# Patient Record
Sex: Male | Born: 1960 | Race: Black or African American | Hispanic: No | Marital: Single | Smoking: Current every day smoker
Health system: Southern US, Community
[De-identification: ages and names within clinical notes are randomized; demographics above are authoritative.]

## PROBLEM LIST (undated history)

## (undated) DIAGNOSIS — K219 Gastro-esophageal reflux disease without esophagitis: Secondary | ICD-10-CM

## (undated) DIAGNOSIS — M545 Low back pain: Secondary | ICD-10-CM

## (undated) DIAGNOSIS — R748 Abnormal levels of other serum enzymes: Secondary | ICD-10-CM

## (undated) DIAGNOSIS — R51 Headache: Secondary | ICD-10-CM

## (undated) DIAGNOSIS — R1013 Epigastric pain: Secondary | ICD-10-CM

## (undated) DIAGNOSIS — L720 Epidermal cyst: Secondary | ICD-10-CM

## (undated) DIAGNOSIS — K047 Periapical abscess without sinus: Secondary | ICD-10-CM

## (undated) DIAGNOSIS — R109 Unspecified abdominal pain: Secondary | ICD-10-CM

## (undated) DIAGNOSIS — F5232 Male orgasmic disorder: Secondary | ICD-10-CM

## (undated) DIAGNOSIS — I10 Essential (primary) hypertension: Secondary | ICD-10-CM

## (undated) DIAGNOSIS — N3289 Other specified disorders of bladder: Secondary | ICD-10-CM

## (undated) DIAGNOSIS — Z720 Tobacco use: Secondary | ICD-10-CM

## (undated) DIAGNOSIS — N529 Male erectile dysfunction, unspecified: Secondary | ICD-10-CM

## (undated) DIAGNOSIS — K4090 Unilateral inguinal hernia, without obstruction or gangrene, not specified as recurrent: Secondary | ICD-10-CM

## (undated) DIAGNOSIS — J039 Acute tonsillitis, unspecified: Secondary | ICD-10-CM

## (undated) DIAGNOSIS — Z131 Encounter for screening for diabetes mellitus: Secondary | ICD-10-CM

## (undated) DIAGNOSIS — F411 Generalized anxiety disorder: Secondary | ICD-10-CM

## (undated) DIAGNOSIS — G8929 Other chronic pain: Secondary | ICD-10-CM

## (undated) DIAGNOSIS — K649 Unspecified hemorrhoids: Secondary | ICD-10-CM

## (undated) DIAGNOSIS — M79674 Pain in right toe(s): Secondary | ICD-10-CM

## (undated) HISTORY — DX: Abnormal levels of other serum enzymes: R74.8

## (undated) HISTORY — DX: Male orgasmic disorder: F52.32

## (undated) HISTORY — DX: Unilateral inguinal hernia, without obstruction or gangrene, not specified as recurrent: K40.90

## (undated) HISTORY — DX: Epidermal cyst: L72.0

## (undated) HISTORY — DX: Unspecified hemorrhoids: K64.9

## (undated) HISTORY — DX: Tobacco use: Z72.0

## (undated) HISTORY — DX: Periapical abscess without sinus: K04.7

## (undated) HISTORY — DX: Acute tonsillitis, unspecified: J03.90

## (undated) HISTORY — DX: Other specified disorders of bladder: N32.89

## (undated) HISTORY — DX: Low back pain: M54.5

## (undated) HISTORY — DX: Other chronic pain: G89.29

## (undated) HISTORY — DX: Headache: R51

## (undated) HISTORY — DX: Male erectile dysfunction, unspecified: N52.9

## (undated) HISTORY — DX: Unspecified abdominal pain: R10.9

## (undated) HISTORY — DX: Encounter for screening for diabetes mellitus: Z13.1

## (undated) HISTORY — DX: Pain in right toe(s): M79.674

## (undated) HISTORY — DX: Epigastric pain: R10.13

## (undated) HISTORY — PX: COLONOSCOPY: SHX174

## (undated) HISTORY — PX: OTHER SURGICAL HISTORY: SHX169

## (undated) HISTORY — DX: Generalized anxiety disorder: F41.1

---

## 2014-01-15 DIAGNOSIS — R1013 Epigastric pain: Secondary | ICD-10-CM

## 2014-01-15 DIAGNOSIS — Z131 Encounter for screening for diabetes mellitus: Secondary | ICD-10-CM

## 2014-01-15 DIAGNOSIS — F411 Generalized anxiety disorder: Secondary | ICD-10-CM | POA: Insufficient documentation

## 2014-01-15 DIAGNOSIS — K219 Gastro-esophageal reflux disease without esophagitis: Secondary | ICD-10-CM | POA: Insufficient documentation

## 2014-01-15 HISTORY — DX: Generalized anxiety disorder: F41.1

## 2014-01-15 HISTORY — DX: Epigastric pain: R10.13

## 2014-01-15 HISTORY — DX: Encounter for screening for diabetes mellitus: Z13.1

## 2014-02-12 DIAGNOSIS — J039 Acute tonsillitis, unspecified: Secondary | ICD-10-CM

## 2014-02-12 HISTORY — DX: Acute tonsillitis, unspecified: J03.90

## 2014-02-20 ENCOUNTER — Emergency Department: Payer: Self-pay | Admitting: Emergency Medicine

## 2014-02-26 DIAGNOSIS — N3289 Other specified disorders of bladder: Secondary | ICD-10-CM

## 2014-02-26 DIAGNOSIS — K047 Periapical abscess without sinus: Secondary | ICD-10-CM

## 2014-02-26 HISTORY — DX: Other specified disorders of bladder: N32.89

## 2014-02-26 HISTORY — DX: Periapical abscess without sinus: K04.7

## 2014-03-05 DIAGNOSIS — M545 Low back pain, unspecified: Secondary | ICD-10-CM

## 2014-03-05 DIAGNOSIS — K4090 Unilateral inguinal hernia, without obstruction or gangrene, not specified as recurrent: Secondary | ICD-10-CM

## 2014-03-05 HISTORY — DX: Low back pain, unspecified: M54.50

## 2014-03-05 HISTORY — DX: Unilateral inguinal hernia, without obstruction or gangrene, not specified as recurrent: K40.90

## 2014-04-05 DIAGNOSIS — M79674 Pain in right toe(s): Secondary | ICD-10-CM | POA: Insufficient documentation

## 2014-04-05 DIAGNOSIS — K649 Unspecified hemorrhoids: Secondary | ICD-10-CM

## 2014-04-05 HISTORY — DX: Unspecified hemorrhoids: K64.9

## 2014-04-05 HISTORY — DX: Pain in right toe(s): M79.674

## 2014-04-11 DIAGNOSIS — N529 Male erectile dysfunction, unspecified: Secondary | ICD-10-CM | POA: Insufficient documentation

## 2014-04-11 HISTORY — DX: Male erectile dysfunction, unspecified: N52.9

## 2014-04-29 HISTORY — PX: UPPER GASTROINTESTINAL ENDOSCOPY: SHX188

## 2014-07-17 DIAGNOSIS — R519 Headache, unspecified: Secondary | ICD-10-CM

## 2014-07-17 DIAGNOSIS — R748 Abnormal levels of other serum enzymes: Secondary | ICD-10-CM | POA: Insufficient documentation

## 2014-07-17 DIAGNOSIS — R51 Headache: Secondary | ICD-10-CM

## 2014-07-17 HISTORY — DX: Headache, unspecified: R51.9

## 2014-07-17 HISTORY — DX: Abnormal levels of other serum enzymes: R74.8

## 2014-08-23 DIAGNOSIS — F5232 Male orgasmic disorder: Secondary | ICD-10-CM | POA: Insufficient documentation

## 2014-08-23 HISTORY — DX: Male orgasmic disorder: F52.32

## 2014-09-16 DIAGNOSIS — Z720 Tobacco use: Secondary | ICD-10-CM | POA: Insufficient documentation

## 2014-09-16 HISTORY — DX: Tobacco use: Z72.0

## 2015-02-07 HISTORY — PX: COLONOSCOPY W/ POLYPECTOMY: SHX1380

## 2015-03-13 HISTORY — PX: LAPAROSCOPY ABDOMEN DIAGNOSTIC: PRO50

## 2015-07-09 ENCOUNTER — Emergency Department
Admission: EM | Admit: 2015-07-09 | Discharge: 2015-07-09 | Disposition: A | Discharge status: Home / Self Care | Payer: Self-pay | Attending: Emergency Medicine | Admitting: Emergency Medicine

## 2015-07-09 ENCOUNTER — Emergency Department: Payer: Self-pay

## 2015-07-09 ENCOUNTER — Encounter: Payer: Self-pay | Admitting: Emergency Medicine

## 2015-07-09 DIAGNOSIS — K140 Glossitis: Secondary | ICD-10-CM | POA: Insufficient documentation

## 2015-07-09 DIAGNOSIS — R072 Precordial pain: Secondary | ICD-10-CM | POA: Insufficient documentation

## 2015-07-09 DIAGNOSIS — I10 Essential (primary) hypertension: Secondary | ICD-10-CM | POA: Insufficient documentation

## 2015-07-09 DIAGNOSIS — F17200 Nicotine dependence, unspecified, uncomplicated: Secondary | ICD-10-CM | POA: Insufficient documentation

## 2015-07-09 DIAGNOSIS — R079 Chest pain, unspecified: Secondary | ICD-10-CM

## 2015-07-09 HISTORY — DX: Gastro-esophageal reflux disease without esophagitis: K21.9

## 2015-07-09 HISTORY — DX: Essential (primary) hypertension: I10

## 2015-07-09 LAB — BASIC METABOLIC PANEL
ANION GAP: 10 (ref 5–15)
BUN: 16 mg/dL (ref 6–20)
CHLORIDE: 106 mmol/L (ref 101–111)
CO2: 26 mmol/L (ref 22–32)
Calcium: 8.9 mg/dL (ref 8.9–10.3)
Creatinine, Ser: 1.09 mg/dL (ref 0.61–1.24)
GFR calc non Af Amer: >60 mL/min (ref >60)
Glucose, Bld: 114 mg/dL — ABNORMAL HIGH (ref 65–99)
POTASSIUM: 3.1 mmol/L — AB (ref 3.5–5.1)
SODIUM: 142 mmol/L (ref 135–145)

## 2015-07-09 LAB — CBC
HEMATOCRIT: 45.1 % (ref 40.0–52.0)
HEMOGLOBIN: 15.7 g/dL (ref 13.0–18.0)
MCH: 30.7 pg (ref 26.0–34.0)
MCHC: 34.8 g/dL (ref 32.0–36.0)
MCV: 88.3 fL (ref 80.0–100.0)
Platelets: 201 10*3/uL (ref 150–440)
RBC: 5.11 MIL/uL (ref 4.40–5.90)
RDW: 14.2 % (ref 11.5–14.5)
WBC: 5.9 10*3/uL (ref 3.8–10.6)

## 2015-07-09 LAB — TROPONIN I: Troponin I: <0.03 ng/mL (ref <0.031)

## 2015-07-09 MED ORDER — MAGIC MOUTHWASH W/LIDOCAINE: 10.0000 mL | Freq: Three times a day (TID) | ORAL | Status: DC | PRN

## 2015-07-09 MED ORDER — MAGIC MOUTHWASH W/LIDOCAINE: 10.0000 mL | Freq: Three times a day (TID) | ORAL | Status: AC | PRN

## 2015-07-09 NOTE — Discharge Instructions (Signed)

## 2015-07-09 NOTE — ED Provider Notes (Signed)
[facility] Emergency Department Provider Note  ____________________________________________  Time seen: 3:00 AM  I have reviewed the triage vital signs and the nursing notes.   HISTORY  Chief Complaint Chest Pain     HPI Douglas Mcdaniel is a 55 y.o. male presents with midsternal chest pain with onset at tonight patient states he awoke him from sleep. However patient states that his chest pain is completely resolved. Patient also admits to a "ulcer on the right side of his tongue that is painful.     Past Medical History  Diagnosis Date  . Hypertension   . GERD (gastroesophageal reflux disease)     There are no active problems to display for this patient.   History reviewed. No pertinent past surgical history.  Current Outpatient Rx  Name  Route  Sig  Dispense  Refill  . amLODipine (NORVASC) 10 MG tablet   Oral   Take 10 mg by mouth daily.         . hydrochlorothiazide (HYDRODIURIL) 25 MG tablet   Oral   Take 25 mg by mouth daily.           Allergies No known drug allergies History reviewed. No pertinent family history.  Social History Social History  Substance Use Topics  . Smoking status: Current Every Day Smoker  . Smokeless tobacco: None  . Alcohol Use: Yes    Review of Systems  Constitutional: Negative for fever. Eyes: Negative for visual changes. ENT: Negative for sore throat. Cardiovascular:Positive for chest pain. Respiratory: Negative for shortness of breath. Gastrointestinal: Negative for abdominal pain, vomiting and diarrhea. Genitourinary: Negative for dysuria. Musculoskeletal: Negative for back pain. Skin: Negative for rash. Neurological: Negative for headaches, focal weakness or numbness.   10-point ROS otherwise negative.  ____________________________________________   PHYSICAL EXAM:  VITAL SIGNS: ED Triage Vitals  Enc Vitals Group     BP 07/09/15 0221 127/74 mmHg     Pulse Rate 07/09/15 0221  81     Resp 07/09/15 0221 18     Temp 07/09/15 0221 97.6 F (36.4 C)     Temp Source 07/09/15 0221 Oral     SpO2 07/09/15 0221 91 %     Weight 07/09/15 0219 196 lb (88.905 kg)     Height 07/09/15 0219 5\' 9"  (1.753 m)     Head Cir --      Peak Flow --      Pain Score 07/09/15 0219 8     Pain Loc --      Pain Edu? --      Excl. in GC? --      Constitutional: Alert and oriented. Well appearing and in no distress. Eyes: Conjunctivae are normal. PERRL. Normal extraocular movements. ENT   Head: Normocephalic and atraumatic.   Nose: No congestion/rhinnorhea.   Mouth/Throat: Mucous membranes are moist.Small ulcer noted right lateral aspect of the patient's tongue without signs of infection   Neck: No stridor. Hematological/Lymphatic/Immunilogical: No cervical lymphadenopathy. Cardiovascular: Normal rate, regular rhythm. Normal and symmetric distal pulses are present in all extremities. No murmurs, rubs, or gallops. Respiratory: Normal respiratory effort without tachypnea nor retractions. Breath sounds are clear and equal bilaterally. No wheezes/rales/rhonchi. Gastrointestinal: Soft and nontender. No distention. There is no CVA tenderness. Genitourinary: deferred Musculoskeletal: Nontender with normal range of motion in all extremities. No joint effusions.  No lower extremity tenderness nor edema. Neurologic:  Normal speech and language. No gross focal neurologic deficits are appreciated. Speech is normal.  Skin:  Skin is warm, dry and intact. No rash noted. Psychiatric: Mood and affect are normal. Speech and behavior are normal. Patient exhibits appropriate insight and judgment.  ____________________________________________    LABS (pertinent positives/negatives)  Labs Reviewed  BASIC METABOLIC PANEL - Abnormal; Notable for the following:    Potassium 3.1 (*)    Glucose, Bld 114 (*)    All other components within normal limits  CBC  TROPONIN I      ____________________________________________   EKG  ED ECG REPORT I, [place] Aldyn Toon, the attending physician, personally viewed and interpreted this ECG.   Date: 07/10/2015  EKG Time: 2:20 AM  Rate: 87  Rhythm: Normal sinus rhythm  Axis: Normal  Intervals: Normal  ST&T Change: None   ____________________________________________    RADIOLOGY  DG Chest Port 1 View (Final result) Result time: 07/09/15 04:13:48   Final result by Rad Results In Interface (07/09/15 04:13:48)   Narrative:   CLINICAL DATA: Midsternal chest pain.  EXAM: PORTABLE CHEST 1 VIEW  COMPARISON: None.  FINDINGS: Shallow inspiration with elevation of the left hemidiaphragm. Scattered calcified granulomas in the lungs. Normal heart size and pulmonary vascularity. No focal airspace disease or consolidation in the lungs. No blunting of costophrenic angles. No pneumothorax. Mediastinal contours appear intact.  IMPRESSION: Elevation of left hemidiaphragm. No evidence of active pulmonary disease.   Electronically Signed By: Burman Nieves M.D. On: 07/09/2015 04:13             INITIAL IMPRESSION / ASSESSMENT AND PLAN / ED COURSE  Pertinent labs & imaging results that were available during my care of the patient were reviewed by me and considered in my medical decision making (see chart for details).  Patient without any chest pain at the time of my evaluation remained chest pain-free during his stay in the emergency department. Patient requesting a prescription for the area that is painful on his tongue for which she was given Magic mouthwash with lidocaine. In addition patient referred to cardiologist for outpatient evaluation for the chest pain that he did explain to home.  ____________________________________________   FINAL CLINICAL IMPRESSION(S) / ED DIAGNOSES  Final diagnoses:  Chest pain, unspecified chest pain type  Tongue ulcer      Darci Current,  MD 07/10/15 0401

## 2015-07-09 NOTE — ED Notes (Signed)
Dr. Brown at bedside

## 2015-07-09 NOTE — ED Notes (Signed)
Pt in with co midsternal chest pain that woke him up tonight.

## 2015-07-09 NOTE — ED Notes (Signed)
Patient states he has an ulcer on right side of tongue. States it is painful.

## 2015-08-06 ENCOUNTER — Emergency Department
Admission: EM | Admit: 2015-08-06 | Discharge: 2015-08-06 | Disposition: A | Discharge status: Home / Self Care | Payer: Self-pay | Attending: Emergency Medicine | Admitting: Emergency Medicine

## 2015-08-06 ENCOUNTER — Encounter: Payer: Self-pay | Admitting: Emergency Medicine

## 2015-08-06 ENCOUNTER — Emergency Department: Payer: Self-pay

## 2015-08-06 DIAGNOSIS — I10 Essential (primary) hypertension: Secondary | ICD-10-CM | POA: Insufficient documentation

## 2015-08-06 DIAGNOSIS — F17210 Nicotine dependence, cigarettes, uncomplicated: Secondary | ICD-10-CM | POA: Insufficient documentation

## 2015-08-06 DIAGNOSIS — J40 Bronchitis, not specified as acute or chronic: Secondary | ICD-10-CM | POA: Insufficient documentation

## 2015-08-06 LAB — BASIC METABOLIC PANEL
Anion gap: 7 (ref 5–15)
BUN: 16 mg/dL (ref 6–20)
CHLORIDE: 104 mmol/L (ref 101–111)
CO2: 26 mmol/L (ref 22–32)
CREATININE: 1.11 mg/dL (ref 0.61–1.24)
Calcium: 8.4 mg/dL — ABNORMAL LOW (ref 8.9–10.3)
GFR calc non Af Amer: >60 mL/min (ref >60)
Glucose, Bld: 155 mg/dL — ABNORMAL HIGH (ref 65–99)
Potassium: 3.3 mmol/L — ABNORMAL LOW (ref 3.5–5.1)
Sodium: 137 mmol/L (ref 135–145)

## 2015-08-06 LAB — CBC
HEMATOCRIT: 46.9 % (ref 40.0–52.0)
HEMOGLOBIN: 15.7 g/dL (ref 13.0–18.0)
MCH: 30.1 pg (ref 26.0–34.0)
MCHC: 33.4 g/dL (ref 32.0–36.0)
MCV: 90.1 fL (ref 80.0–100.0)
Platelets: 197 10*3/uL (ref 150–440)
RBC: 5.21 MIL/uL (ref 4.40–5.90)
RDW: 14.5 % (ref 11.5–14.5)
WBC: 6 10*3/uL (ref 3.8–10.6)

## 2015-08-06 LAB — TROPONIN I: Troponin I: <0.03 ng/mL (ref <0.031)

## 2015-08-06 MED ORDER — PREDNISONE 20 MG PO TABS: ORAL_TABLET | ORAL | Status: DC

## 2015-08-06 MED ORDER — PREDNISONE 20 MG PO TABS
60.0000 mg | ORAL_TABLET | Freq: Once | ORAL | Status: AC
  Administered 2015-08-06: 60 mg via ORAL

## 2015-08-06 MED ORDER — ALBUTEROL SULFATE HFA 108 (90 BASE) MCG/ACT IN AERS: 2.0000 | INHALATION_SPRAY | RESPIRATORY_TRACT | Status: DC | PRN

## 2015-08-06 MED ORDER — HYDROCOD POLST-CPM POLST ER 10-8 MG/5ML PO SUER: 5.0000 mL | Freq: Two times a day (BID) | ORAL | Status: DC

## 2015-08-06 NOTE — ED Notes (Signed)
Pt ambulatory to triage room with steady gait. Pt resents to ED with c/o cough x 2 days, reports produces white sputum when coughing. Pt also c/o shortness of breath and chest pain when coughing. Pt alert and oriented x 4, no increased work in breathing noted. Speaking in complete sentences.

## 2015-08-06 NOTE — ED Provider Notes (Signed)
[facility] Emergency Department Provider Note   ____________________________________________  Time seen: Approximately 5:38 AM  I have reviewed the triage vital signs and the nursing notes.   HISTORY  Chief Complaint Cough and Chest Pain    HPI Douglas Mcdaniel is a 55 y.o. male presents to the ED from home with a chief complaint of cough 2 days. Reports cough with white sputum with occasional wheezing and shortness of breath. Denies associated fever, chills, chest pain, abdominal pain, nausea, vomiting, diarrhea. Denies recent travel or trauma. Nothing makes his symptoms better or worse.   Past Medical History  Diagnosis Date  . Hypertension   . GERD (gastroesophageal reflux disease)     There are no active problems to display for this patient.   History reviewed. No pertinent past surgical history.  Current Outpatient Rx  Name  Route  Sig  Dispense  Refill  . albuterol (PROVENTIL HFA;VENTOLIN HFA) 108 (90 Base) MCG/ACT inhaler   Inhalation   Inhale 2 puffs into the lungs every 4 (four) hours as needed for wheezing or shortness of breath.   1 Inhaler   0   . amLODipine (NORVASC) 10 MG tablet   Oral   Take 10 mg by mouth daily.         . chlorpheniramine-HYDROcodone (TUSSIONEX PENNKINETIC ER) 10-8 MG/5ML SUER   Oral   Take 5 mLs by mouth 2 (two) times daily.   70 mL   0   . hydrochlorothiazide (HYDRODIURIL) 25 MG tablet   Oral   Take 25 mg by mouth daily.         . predniSONE (DELTASONE) 20 MG tablet      3 tablets daily 4 days   12 tablet   0     Allergies Review of patient's allergies indicates no known allergies.  No family history on file.  Social History Social History  Substance Use Topics  . Smoking status: Current Every Day Smoker -- 0.00 packs/day    Types: Cigarettes  . Smokeless tobacco: None  . Alcohol Use: Yes  + smoker  Review of Systems  Constitutional: No fever/chills. Eyes: No visual  changes. ENT: No sore throat. Cardiovascular: Denies chest pain. Respiratory: Positive for cough and shortness of breath. Gastrointestinal: No abdominal pain.  No nausea, no vomiting.  No diarrhea.  No constipation. Genitourinary: Negative for dysuria. Musculoskeletal: Negative for back pain. Skin: Negative for rash. Neurological: Negative for headaches, focal weakness or numbness.  10-point ROS otherwise negative.  ____________________________________________   PHYSICAL EXAM:  VITAL SIGNS: ED Triage Vitals  Enc Vitals Group     BP 08/06/15 0213 144/78 mmHg     Pulse Rate 08/06/15 0213 86     Resp 08/06/15 0213 18     Temp 08/06/15 0213 97.8 F (36.6 C)     Temp Source 08/06/15 0213 Oral     SpO2 08/06/15 0213 96 %     Weight 08/06/15 0213 200 lb (90.719 kg)     Height 08/06/15 0213  (1.753 m)     Head Cir --      Peak Flow --      Pain Score 08/06/15 0214 6     Pain Loc --      Pain Edu? --      Excl. in GC? --     Constitutional: Alert and oriented. Well appearing and in no acute distress. Eyes: Conjunctivae are normal. PERRL. EOMI. Head: Atraumatic. Nose: No congestion/rhinnorhea. Mouth/Throat: Mucous  membranes are moist.  Oropharynx non-erythematous. Neck: No stridor.   Cardiovascular: Normal rate, regular rhythm. Grossly normal heart sounds.  Good peripheral circulation. Respiratory: Normal respiratory effort.  No retractions. Lungs CTAB. No wheezing. Bronchitic cough noted. Gastrointestinal: Soft and nontender. No distention. No abdominal bruits. No CVA tenderness. Musculoskeletal: No lower extremity tenderness nor edema.  No joint effusions. Neurologic:  Normal speech and language. No gross focal neurologic deficits are appreciated. No gait instability. Skin:  Skin is warm, dry and intact. No rash noted. Psychiatric: Mood and affect are normal. Speech and behavior are normal.  ____________________________________________   LABS (all labs ordered are  listed, but only abnormal results are displayed)  Labs Reviewed  BASIC METABOLIC PANEL - Abnormal; Notable for the following:    Potassium 3.3 (*)    Glucose, Bld 155 (*)    Calcium 8.4 (*)    All other components within normal limits  CBC  TROPONIN I   ____________________________________________  EKG  ED ECG REPORT I, SUNG,JADE J, the attending physician, personally viewed and interpreted this ECG.   Date: 08/06/2015  EKG Time: 0219  Rate: 83  Rhythm: normal EKG, normal sinus rhythm  Axis: Normal  Intervals:none  ST&T Change: Nonspecific  ____________________________________________  RADIOLOGY  Chest 2 view (view by me, interpreted per Dr. Cherly Hensenhang): Mild bibasilar opacities likely reflect atelectasis. ____________________________________________   PROCEDURES  Procedure(s) performed: None  Critical Care performed: No  ____________________________________________   INITIAL IMPRESSION / ASSESSMENT AND PLAN / ED COURSE  Pertinent labs & imaging results that were available during my care of the patient were reviewed by me and considered in my medical decision making (see chart for details).  55 year old smoker who presents with bronchitis-like symptoms. Updated patient of laboratory and imaging results. Will place on prednisone burst, prescribe inhaler as needed, Tussionex as needed and follow-up with PCP. Strict return precautions given. Patient verbalizes understanding and agrees with plan of care. ____________________________________________   FINAL CLINICAL IMPRESSION(S) / ED DIAGNOSES  Final diagnoses:  Bronchitis      NEW MEDICATIONS STARTED DURING THIS VISIT:  New Prescriptions   ALBUTEROL (PROVENTIL HFA;VENTOLIN HFA) 108 (90 BASE) MCG/ACT INHALER    Inhale 2 puffs into the lungs every 4 (four) hours as needed for wheezing or shortness of breath.   CHLORPHENIRAMINE-HYDROCODONE (TUSSIONEX PENNKINETIC ER) 10-8 MG/5ML SUER    Take 5 mLs by mouth 2 (two)  times daily.   PREDNISONE (DELTASONE) 20 MG TABLET    3 tablets daily 4 days     Note:  This document was prepared using Dragon voice recognition software and may include unintentional dictation errors.    Irean HongJade J Sung, MD 08/06/15 [phone]

## 2015-08-06 NOTE — Discharge Instructions (Signed)
1. Take steroid as prescribed (Prednisone 60mg  daily x 4 days). 2. You may use albuterol inhaler 2 puffs every 4 hours as needed for wheezing. 3. You may take cough medicine as needed (Tussionex) disease. 4. Return to ER for worsening symptoms, persistent vomiting, difficult breathing or other concerns.  How to Use an Inhaler Using your inhaler correctly is very important. Good technique will make sure that the medicine reaches your lungs.  HOW TO USE AN INHALER: 1. Take the cap off the inhaler. 2. If this is the first time using your inhaler, you need to prime it. Shake the inhaler for 5 seconds. Release four puffs into the air, away from your face. Ask your doctor for help if you have questions. 3. Shake the inhaler for 5 seconds. 4. Turn the inhaler so the bottle is above the mouthpiece. 5. Put your pointer finger on top of the bottle. Your thumb holds the bottom of the inhaler. 6. Open your mouth. 7. Either hold the inhaler away from your mouth (the width of 2 fingers) or place your lips tightly around the mouthpiece. Ask your doctor which way to use your inhaler. 8. Breathe out as much air as possible. 9. Breathe in and push down on the bottle 1 time to release the medicine. You will feel the medicine go in your mouth and throat. 10. Continue to take a deep breath in very slowly. Try to fill your lungs. 11. After you have breathed in completely, hold your breath for 10 seconds. This will help the medicine to settle in your lungs. If you cannot hold your breath for 10 seconds, hold it for as long as you can before you breathe out. 12. Breathe out slowly, through pursed lips. Whistling is an example of pursed lips. 13. If your doctor has told you to take more than 1 puff, wait at least 15-30 seconds between puffs. This will help you get the best results from your medicine. Do not use the inhaler more than your doctor tells you to. 14. Put the cap back on the inhaler. 15. Follow the  directions from your doctor or from the inhaler package about cleaning the inhaler. If you use more than one inhaler, ask your doctor which inhalers to use and what order to use them in. Ask your doctor to help you figure out when you will need to refill your inhaler.  If you use a steroid inhaler, always rinse your mouth with water after your last puff, gargle and spit out the water. Do not swallow the water. GET HELP IF:  The inhaler medicine only partially helps to stop wheezing or shortness of breath.  You are having trouble using your inhaler.  You have some increase in thick spit (phlegm). GET HELP RIGHT AWAY IF:  The inhaler medicine does not help your wheezing or shortness of breath or you have tightness in your chest.  You have dizziness, headaches, or fast heart rate.  You have chills, fever, or night sweats.  You have a large increase of thick spit, or your thick spit is bloody. MAKE SURE YOU:   Understand these instructions.  Will watch your condition.  Will get help right away if you are not doing well or get worse.   This information is not intended to replace advice given to you by your health care provider. Make sure you discuss any questions you have with your health care provider.   Document Released: 12/02/2007 Document Revised: 12/13/2012 Document Reviewed: 09/21/2012 Elsevier  Interactive Patient Education ©2016 [employer] ° °

## 2015-08-13 DIAGNOSIS — R109 Unspecified abdominal pain: Secondary | ICD-10-CM

## 2015-08-13 DIAGNOSIS — L720 Epidermal cyst: Secondary | ICD-10-CM

## 2015-08-13 DIAGNOSIS — G8929 Other chronic pain: Secondary | ICD-10-CM | POA: Insufficient documentation

## 2015-08-13 HISTORY — DX: Epidermal cyst: L72.0

## 2015-08-13 HISTORY — DX: Unspecified abdominal pain: R10.9

## 2015-08-13 HISTORY — DX: Other chronic pain: G89.29

## 2016-02-06 ENCOUNTER — Emergency Department: Payer: Self-pay

## 2016-02-06 ENCOUNTER — Emergency Department
Admission: EM | Admit: 2016-02-06 | Discharge: 2016-02-06 | Disposition: A | Discharge status: Home / Self Care | Payer: Self-pay | Attending: Emergency Medicine | Admitting: Emergency Medicine

## 2016-02-06 DIAGNOSIS — I10 Essential (primary) hypertension: Secondary | ICD-10-CM | POA: Insufficient documentation

## 2016-02-06 DIAGNOSIS — Z79899 Other long term (current) drug therapy: Secondary | ICD-10-CM | POA: Insufficient documentation

## 2016-02-06 DIAGNOSIS — F17210 Nicotine dependence, cigarettes, uncomplicated: Secondary | ICD-10-CM | POA: Insufficient documentation

## 2016-02-06 DIAGNOSIS — R1013 Epigastric pain: Secondary | ICD-10-CM | POA: Insufficient documentation

## 2016-02-06 LAB — COMPREHENSIVE METABOLIC PANEL
ALK PHOS: 53 U/L (ref 38–126)
ALT: 29 U/L (ref 17–63)
AST: 29 U/L (ref 15–41)
Albumin: 3.9 g/dL (ref 3.5–5.0)
Anion gap: 7 (ref 5–15)
BILIRUBIN TOTAL: 0.7 mg/dL (ref 0.3–1.2)
BUN: 15 mg/dL (ref 6–20)
CALCIUM: 8.9 mg/dL (ref 8.9–10.3)
CHLORIDE: 104 mmol/L (ref 101–111)
CO2: 28 mmol/L (ref 22–32)
CREATININE: 1.03 mg/dL (ref 0.61–1.24)
Glucose, Bld: 98 mg/dL (ref 65–99)
Potassium: 3.3 mmol/L — ABNORMAL LOW (ref 3.5–5.1)
Sodium: 139 mmol/L (ref 135–145)
Total Protein: 7 g/dL (ref 6.5–8.1)

## 2016-02-06 LAB — CBC
HEMATOCRIT: 51.4 % (ref 40.0–52.0)
HEMOGLOBIN: 17.1 g/dL (ref 13.0–18.0)
MCH: 29.7 pg (ref 26.0–34.0)
MCHC: 33.3 g/dL (ref 32.0–36.0)
MCV: 89.3 fL (ref 80.0–100.0)
Platelets: 221 10*3/uL (ref 150–440)
RBC: 5.76 MIL/uL (ref 4.40–5.90)
RDW: 14.4 % (ref 11.5–14.5)
WBC: 5.8 10*3/uL (ref 3.8–10.6)

## 2016-02-06 LAB — URINALYSIS COMPLETE WITH MICROSCOPIC (ARMC ONLY)
BILIRUBIN URINE: NEGATIVE
Bacteria, UA: NONE SEEN
Glucose, UA: NEGATIVE mg/dL
Hgb urine dipstick: NEGATIVE
KETONES UR: NEGATIVE mg/dL
Leukocytes, UA: NEGATIVE
Nitrite: NEGATIVE
PH: 6 (ref 5.0–8.0)
PROTEIN: NEGATIVE mg/dL
Specific Gravity, Urine: 1.023 (ref 1.005–1.030)

## 2016-02-06 LAB — LIPASE, BLOOD: LIPASE: 23 U/L (ref 11–51)

## 2016-02-06 MED ORDER — IOPAMIDOL (ISOVUE-300) INJECTION 61%
100.0000 mL | Freq: Once | INTRAVENOUS | Status: AC | PRN
Start: 2016-02-06 — End: 2016-02-06
  Administered 2016-02-06: 100 mL via INTRAVENOUS

## 2016-02-06 MED ORDER — GI COCKTAIL ~~LOC~~
30.0000 mL | Freq: Once | ORAL | Status: AC
  Administered 2016-02-06: 30 mL via ORAL

## 2016-02-06 MED ORDER — GI COCKTAIL ~~LOC~~
ORAL | Status: AC
  Administered 2016-02-06: 30 mL via ORAL
  Filled 2016-02-06: qty 30

## 2016-02-06 MED ORDER — OMEPRAZOLE 40 MG PO CPDR: 40.0000 mg | DELAYED_RELEASE_CAPSULE | Freq: Every day | ORAL | 0 refills | Status: DC

## 2016-02-06 MED ORDER — IOPAMIDOL (ISOVUE-300) INJECTION 61%
30.0000 mL | Freq: Once | INTRAVENOUS | Status: AC | PRN
  Administered 2016-02-06: 30 mL via ORAL

## 2016-02-06 NOTE — ED Triage Notes (Signed)
Pt in with co upper abd pain x 3-4 days, vomited x 2 today. No diarrhea, or dysuria. Pt has hx of gastritis states does not feel the same.

## 2016-02-06 NOTE — ED Notes (Signed)
Discharge instructions reviewed with patient. Patient verbalized understanding. Patient ambulated to lobby without difficulty.   

## 2016-02-06 NOTE — ED Provider Notes (Signed)
[facility] Emergency Department Provider Note    First MD Initiated Contact with Patient 02/06/16 0411     (approximate)  I have reviewed the triage vital signs and the nursing notes.   HISTORY  Chief Complaint Abdominal Pain  HPI Douglas Mcdaniel is a 55 y.o. male with history of below listed in chronic medical conditions presents to emergency department epigastric abdominal pain is currently 7 out of 10 2 days. Patient admitted to 2 episodes of vomiting today that was nonbloody.. Patient denies any nausea no vomiting or diarrhea. Patient denies any constipation. Patient denies any fever.   Past Medical History:  Diagnosis Date  . GERD (gastroesophageal reflux disease)   . Hypertension     There are no active problems to display for this patient.   No past surgical history on file.  Prior to Admission medications   Medication Sig Start Date End Date Taking? Authorizing Provider  albuterol (PROVENTIL HFA;VENTOLIN HFA) 108 (90 Base) MCG/ACT inhaler Inhale 2 puffs into the lungs every 4 (four) hours as needed for wheezing or shortness of breath. 08/06/15   Irean Hong, MD  amLODipine (NORVASC) 10 MG tablet Take 10 mg by mouth daily.    Historical Provider, MD  chlorpheniramine-HYDROcodone (TUSSIONEX PENNKINETIC ER) 10-8 MG/5ML SUER Take 5 mLs by mouth 2 (two) times daily. 08/06/15   Irean Hong, MD  hydrochlorothiazide (HYDRODIURIL) 25 MG tablet Take 25 mg by mouth daily.    Historical Provider, MD  omeprazole (PRILOSEC) 40 MG capsule Take 1 capsule (40 mg total) by mouth daily. 02/06/16 02/05/17  Darci Current, MD  predniSONE (DELTASONE) 20 MG tablet 3 tablets daily 4 days 08/06/15   Irean Hong, MD    Allergies Patient has no known allergies.  No family history on file.  Social History Social History  Substance Use Topics  . Smoking status: Current Every Day Smoker    Packs/day: 0.00    Types: Cigarettes  . Smokeless tobacco: Not on file    . Alcohol use Yes    Review of Systems Constitutional: No fever/chills Eyes: No visual changes. ENT: No sore throat. Cardiovascular: Denies chest pain. Respiratory: Denies shortness of breath. Gastrointestinal: Positive for abdominal pain  No nausea, no vomiting.  No diarrhea.  No constipation. Genitourinary: Negative for dysuria. Musculoskeletal: Negative for back pain. Skin: Negative for rash. Neurological: Negative for headaches, focal weakness or numbness.  10-point ROS otherwise negative.  ____________________________________________   PHYSICAL EXAM:  VITAL SIGNS: ED Triage Vitals  Enc Vitals Group     BP 02/06/16 0150 133/83     Pulse Rate 02/06/16 0150 88     Resp 02/06/16 0150 18     Temp 02/06/16 0150 98 F (36.7 C)     Temp Source 02/06/16 0150 Oral     SpO2 02/06/16 0150 94 %     Weight 02/06/16 0151 206 lb (93.4 kg)     Height 02/06/16 0151 5\' 9"  (1.753 m)     Head Circumference --      Peak Flow --      Pain Score 02/06/16 0159 8     Pain Loc --      Pain Edu? --      Excl. in GC? --     Constitutional: Alert and oriented. Well appearing and in no acute distress. Eyes: Conjunctivae are normal. PERRL. EOMI. Head: Atraumatic. Ears:  Healthy appearing ear canals and TMs bilaterally Nose: No congestion/rhinnorhea. Mouth/Throat: Mucous membranes  are moist.  Oropharynx non-erythematous. Neck: No stridor.  No meningeal signs.  No cervical spine tenderness to palpation. Cardiovascular: Normal rate, regular rhythm. Good peripheral circulation. Grossly normal heart sounds. Respiratory: Normal respiratory effort.  No retractions. Lungs CTAB. Gastrointestinal: Soft and nontender. No distention.  Musculoskeletal: No lower extremity tenderness nor edema. No gross deformities of extremities. Neurologic:  Normal speech and language. No gross focal neurologic deficits are appreciated.  Skin:  Skin is warm, dry and intact. No rash noted. Psychiatric: Mood and  affect are normal. Speech and behavior are normal.  ____________________________________________   LABS (all labs ordered are listed, but only abnormal results are displayed)  Labs Reviewed  COMPREHENSIVE METABOLIC PANEL - Abnormal; Notable for the following:       Result Value   Potassium 3.3 (*)    All other components within normal limits  URINALYSIS COMPLETEWITH MICROSCOPIC ([facility] ONLY) - Abnormal; Notable for the following:    Color, Urine YELLOW (*)    APPearance CLEAR (*)    Squamous Epithelial / LPF 0-5 (*)    All other components within normal limits  CBC  LIPASE, BLOOD    RADIOLOGY I, Imperial N BROWN, personally viewed and evaluated these images (plain radiographs) as part of my medical decision making, as well as reviewing the written report by the radiologist.  Ct Abdomen Pelvis W Contrast  Result Date: 02/06/2016 CLINICAL DATA:  Acute onset of upper abdominal pain and vomiting. Initial encounter. EXAM: CT ABDOMEN AND PELVIS WITH CONTRAST TECHNIQUE: Multidetector CT imaging of the abdomen and pelvis was performed using the standard protocol following bolus administration of intravenous contrast. CONTRAST:  100mL ISOVUE-300 IOPAMIDOL (ISOVUE-300) INJECTION 61% COMPARISON:  None. FINDINGS: Lower chest: Minimal bibasilar atelectasis or scarring is noted. The visualized portions of the mediastinum are unremarkable. Hepatobiliary: The liver is unremarkable in appearance. The gallbladder is unremarkable in appearance. The common bile duct remains normal in caliber. Pancreas: The pancreas is within normal limits. Spleen: The spleen is unremarkable in appearance. Adrenals/Urinary Tract: The adrenal glands are unremarkable in appearance. The kidneys are within normal limits. There is no evidence of hydronephrosis. No renal or ureteral stones are identified. No perinephric stranding is seen. Stomach/Bowel: The stomach is unremarkable in appearance. Two transient small bowel  intussusceptions are noted at the left mid abdomen. The small bowel is otherwise unremarkable. The appendix is normal in caliber, without evidence of appendicitis. The colon is unremarkable in appearance. Vascular/Lymphatic: The abdominal aorta is unremarkable in appearance. The inferior vena cava is grossly unremarkable. No retroperitoneal lymphadenopathy is seen. No pelvic sidewall lymphadenopathy is identified. Reproductive: The bladder is moderately distended and grossly unremarkable. The prostate remains borderline normal in size. Other: No additional soft tissue abnormalities are seen. Musculoskeletal: No acute osseous abnormalities are identified. The visualized musculature is unremarkable in appearance. IMPRESSION: 1. No acute abnormality seen to explain the patient's symptoms. 2. Two transient small bowel intussusceptions within the left mid abdomen, likely within normal limits. Electronically Signed   By: Roanna RaiderJeffery  Chang M.D.   On: 02/06/2016 06:15     Procedures      INITIAL IMPRESSION / ASSESSMENT AND PLAN / ED COURSE  Pertinent labs & imaging results that were available during my care of the patient were reviewed by me and considered in my medical decision making (see chart for details).  Patient states that pain is completely resolved status post GI cocktail. Patient referred to gastroenterologist for further outpatient evaluation and management.   Clinical Course  ____________________________________________  FINAL CLINICAL IMPRESSION(S) / ED DIAGNOSES  Final diagnoses:  Epigastric pain     MEDICATIONS GIVEN DURING THIS VISIT:  Medications  gi cocktail (Maalox,Lidocaine,Donnatal) (30 mLs Oral Given 02/06/16 0442)  iopamidol (ISOVUE-300) 61 % injection 30 mL (30 mLs Oral Contrast Given 02/06/16 0448)  iopamidol (ISOVUE-300) 61 % injection 100 mL (100 mLs Intravenous Contrast Given 02/06/16 0555)     NEW OUTPATIENT MEDICATIONS STARTED DURING THIS VISIT:  New  Prescriptions   OMEPRAZOLE (PRILOSEC) 40 MG CAPSULE    Take 1 capsule (40 mg total) by mouth daily.    Modified Medications   No medications on file    Discontinued Medications   No medications on file     Note:  This document was prepared using Dragon voice recognition software and may include unintentional dictation errors.    Darci Currentandolph N Brown, MD 02/06/16 [phone]

## 2016-02-23 ENCOUNTER — Emergency Department
Admission: EM | Admit: 2016-02-23 | Discharge: 2016-02-23 | Disposition: A | Discharge status: Home / Self Care | Payer: Self-pay | Attending: Emergency Medicine | Admitting: Emergency Medicine

## 2016-02-23 DIAGNOSIS — R519 Headache, unspecified: Secondary | ICD-10-CM

## 2016-02-23 DIAGNOSIS — Z79899 Other long term (current) drug therapy: Secondary | ICD-10-CM | POA: Insufficient documentation

## 2016-02-23 DIAGNOSIS — J0100 Acute maxillary sinusitis, unspecified: Secondary | ICD-10-CM | POA: Insufficient documentation

## 2016-02-23 DIAGNOSIS — F17210 Nicotine dependence, cigarettes, uncomplicated: Secondary | ICD-10-CM | POA: Insufficient documentation

## 2016-02-23 DIAGNOSIS — I10 Essential (primary) hypertension: Secondary | ICD-10-CM | POA: Insufficient documentation

## 2016-02-23 DIAGNOSIS — R51 Headache: Secondary | ICD-10-CM

## 2016-02-23 LAB — URINALYSIS, ROUTINE W REFLEX MICROSCOPIC
BILIRUBIN URINE: NEGATIVE
GLUCOSE, UA: NEGATIVE mg/dL
HGB URINE DIPSTICK: NEGATIVE
Ketones, ur: NEGATIVE mg/dL
Leukocytes, UA: NEGATIVE
Nitrite: NEGATIVE
PROTEIN: NEGATIVE mg/dL
Specific Gravity, Urine: 1.018 (ref 1.005–1.030)
pH: 7 (ref 5.0–8.0)

## 2016-02-23 LAB — INFLUENZA PANEL BY PCR (TYPE A & B)
INFLAPCR: NEGATIVE
Influenza B By PCR: NEGATIVE

## 2016-02-23 MED ORDER — BENZONATATE 100 MG PO CAPS: 100.0000 mg | ORAL_CAPSULE | Freq: Three times a day (TID) | ORAL | 0 refills | Status: DC | PRN

## 2016-02-23 MED ORDER — FLUTICASONE PROPIONATE 50 MCG/ACT NA SUSP: 2.0000 | Freq: Every day | NASAL | 0 refills | Status: AC

## 2016-02-23 NOTE — ED Notes (Signed)
Rounding in the lobby; pt waiting patiently in the WR, apologized for the long wait; pt verbalized understanding

## 2016-02-23 NOTE — ED Provider Notes (Signed)
[facility] Emergency Department Provider Note ____________________________________________  Time seen: 0715  I have reviewed the triage vital signs and the nursing notes.  HISTORY  Chief Complaint  Headache  HPI Douglas Mcdaniel is a 55 y.o. male presents to the ED for evaluation of some headache and sinus congestion over the last week. Patient describes subjective fevers but denies any significant nasal drainage, cough, or congestion. He also denies any visual disturbance or distal paresthesias. He has taken Tylenol and ibuprofen intermittently for symptom relief. He also reports an unrelated complaint of some left flank and rib pain. He was evaluated [facility] and reports a negative chest x-ray.   Past Medical History:  Diagnosis Date  . GERD (gastroesophageal reflux disease)   . Hypertension     There are no active problems to display for this patient.  No past surgical history on file.  Prior to Admission medications   Medication Sig Start Date End Date Taking? Authorizing Provider  albuterol (PROVENTIL HFA;VENTOLIN HFA) 108 (90 Base) MCG/ACT inhaler Inhale 2 puffs into the lungs every 4 (four) hours as needed for wheezing or shortness of breath. 08/06/15   Irean HongJade J Sung, MD  amLODipine (NORVASC) 10 MG tablet Take 10 mg by mouth daily.    Historical Provider, MD  benzonatate (TESSALON PERLES) 100 MG capsule Take 1 capsule (100 mg total) by mouth 3 (three) times daily as needed for cough (Take 1-2 per dose). 02/23/16   Jveon Pound V Bacon Demari Kropp, PA-C  chlorpheniramine-HYDROcodone (TUSSIONEX PENNKINETIC ER) 10-8 MG/5ML SUER Take 5 mLs by mouth 2 (two) times daily. 08/06/15   Irean HongJade J Sung, MD  fluticasone (FLONASE) 50 MCG/ACT nasal spray Place 2 sprays into both nostrils daily. 02/23/16   Meilani Edmundson V Bacon Aneisha Skyles, PA-C  hydrochlorothiazide (HYDRODIURIL) 25 MG tablet Take 25 mg by mouth daily.    Historical Provider, MD  omeprazole (PRILOSEC) 40 MG capsule  Take 1 capsule (40 mg total) by mouth daily. 02/06/16 02/05/17  Darci Currentandolph N Brown, MD  predniSONE (DELTASONE) 20 MG tablet 3 tablets daily 4 days 08/06/15   Irean HongJade J Sung, MD    Allergies Patient has no known allergies.  No family history on file.  Social History Social History  Substance Use Topics  . Smoking status: Current Every Day Smoker    Packs/day: 0.00    Types: Cigarettes  . Smokeless tobacco: Not on file  . Alcohol use Yes    Review of Systems  Constitutional: Negative for fever. Eyes: Negative for visual changes. ENT: Negative for sore throat.Reports sinus congestion as above.  Cardiovascular: Negative for chest pain. Respiratory: Negative for shortness of breath. Gastrointestinal: Negative for abdominal pain, vomiting and diarrhea. Genitourinary: Negative for dysuria. Musculoskeletal:Positive for back pain. Skin: Negative for rash. Neurological: Negative for headaches, focal weakness or numbness. ____________________________________________  PHYSICAL EXAM:  VITAL SIGNS: ED Triage Vitals [02/23/16 0225]  Enc Vitals Group     BP 133/77     Pulse Rate 92     Resp 18     Temp 97.8 F (36.6 C)     Temp Source Oral     SpO2 97 %     Weight 206 lb (93.4 kg)     Height 5\' 9"  (1.753 m)     Head Circumference      Peak Flow      Pain Score 10     Pain Loc      Pain Edu?      Excl. in  GC?    Constitutional: Alert and oriented. Well appearing and in no distress. Head: Normocephalic and atraumatic. Eyes: Conjunctivae are normal. PERRL. Normal extraocular movements Ears: Canals clear. TMs intact bilaterally. Nose: No congestion/rhinorrhea/epistaxis. Mouth/Throat: Mucous membranes are moist. Neck: Supple. No thyromegaly. Hematological/Lymphatic/Immunological: No cervical lymphadenopathy. Cardiovascular: Normal rate, regular rhythm. Normal distal pulses. Respiratory: Normal respiratory effort. No wheezes/rales/rhonchi. Gastrointestinal: Soft and nontender. No  distention,, Rebound, guarding, organomegaly. No CVA tenderness.  Musculoskeletal:  normal spinal alignment without midline tenderness, spasm, deformity, or step-off. Patient with tenderness to palpation to the left lumbar sacral junction. Nontender with normal range of motion in all extremities.  Neurologic:  Normal gait without ataxia. Normal speech and language. No gross focal neurologic deficits are appreciated. ____________________________________________   LABS (pertinent positives/negatives) Labs Reviewed  URINALYSIS, ROUTINE W REFLEX MICROSCOPIC - Abnormal; Notable for the following:       Result Value   Color, Urine YELLOW (*)    APPearance CLEAR (*)    All other components within normal limits  INFLUENZA PANEL BY PCR (TYPE A & B, H1N1)  ____________________________________________  INITIAL IMPRESSION / ASSESSMENT AND PLAN / ED COURSE  Patient with symptoms consistent with a sinus headache and a subsequent acute cystitis. He is reassured by his normal urinalysis and negative influenza screen. Patient's symptoms does not appear to be consistent with an acute influenza infection considering he has been afebrile in the last week. He will be discharged [facility] and Flonase. He will follow up with his primary care provider or return to the ED for continued symptoms.  Clinical Course    ____________________________________________  FINAL CLINICAL IMPRESSION(S) / ED DIAGNOSES  Final diagnoses:  Sinus headache  Subacute maxillary sinusitis     Lissa HoardJenise V Bacon Paulette Rockford, PA-C 02/23/16 1356    Charlynne Panderavid Hsienta Yao, MD 02/23/16 [phone]

## 2016-02-23 NOTE — ED Notes (Signed)
See triage note. States he developed headache,cough  Sinus pressure about 1 week ago  [facility].  Occasional fever afebrile at present
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
## 2016-02-23 NOTE — Discharge Instructions (Signed)
Your exam and labs are normal today. You have likely viral URI. Take the prescriptions as directed. Consider using an OTC allergy medicine (Allegra, Claritin, Zyrtec) for symptom relief. [facility] for continued [facility].

## 2016-02-25 ENCOUNTER — Emergency Department
Admission: EM | Admit: 2016-02-25 | Discharge: 2016-02-25 | Disposition: A | Discharge status: Home / Self Care | Payer: Self-pay | Attending: Emergency Medicine | Admitting: Emergency Medicine

## 2016-02-25 ENCOUNTER — Emergency Department: Payer: Self-pay

## 2016-02-25 ENCOUNTER — Encounter: Payer: Self-pay | Admitting: Emergency Medicine

## 2016-02-25 DIAGNOSIS — R51 Headache: Secondary | ICD-10-CM | POA: Insufficient documentation

## 2016-02-25 DIAGNOSIS — I10 Essential (primary) hypertension: Secondary | ICD-10-CM | POA: Insufficient documentation

## 2016-02-25 DIAGNOSIS — K2950 Unspecified chronic gastritis without bleeding: Secondary | ICD-10-CM | POA: Insufficient documentation

## 2016-02-25 DIAGNOSIS — Z79899 Other long term (current) drug therapy: Secondary | ICD-10-CM | POA: Insufficient documentation

## 2016-02-25 DIAGNOSIS — R519 Headache, unspecified: Secondary | ICD-10-CM

## 2016-02-25 DIAGNOSIS — R1013 Epigastric pain: Secondary | ICD-10-CM

## 2016-02-25 DIAGNOSIS — F17210 Nicotine dependence, cigarettes, uncomplicated: Secondary | ICD-10-CM | POA: Insufficient documentation

## 2016-02-25 LAB — COMPREHENSIVE METABOLIC PANEL
ALBUMIN: 3.8 g/dL (ref 3.5–5.0)
ALT: 25 U/L (ref 17–63)
AST: 32 U/L (ref 15–41)
Alkaline Phosphatase: 54 U/L (ref 38–126)
Anion gap: 7 (ref 5–15)
BUN: 15 mg/dL (ref 6–20)
CHLORIDE: 104 mmol/L (ref 101–111)
CO2: 27 mmol/L (ref 22–32)
Calcium: 8.8 mg/dL — ABNORMAL LOW (ref 8.9–10.3)
Creatinine, Ser: 1.14 mg/dL (ref 0.61–1.24)
GFR calc Af Amer: >60 mL/min (ref >60)
Glucose, Bld: 178 mg/dL — ABNORMAL HIGH (ref 65–99)
POTASSIUM: 3.6 mmol/L (ref 3.5–5.1)
Sodium: 138 mmol/L (ref 135–145)
Total Bilirubin: 0.3 mg/dL (ref 0.3–1.2)
Total Protein: 6.8 g/dL (ref 6.5–8.1)

## 2016-02-25 LAB — URINALYSIS, COMPLETE (UACMP) WITH MICROSCOPIC
BACTERIA UA: NONE SEEN
BILIRUBIN URINE: NEGATIVE
Glucose, UA: NEGATIVE mg/dL
Hgb urine dipstick: NEGATIVE
KETONES UR: NEGATIVE mg/dL
LEUKOCYTES UA: NEGATIVE
Nitrite: NEGATIVE
PROTEIN: NEGATIVE mg/dL
SPECIFIC GRAVITY, URINE: 1.029 (ref 1.005–1.030)
SQUAMOUS EPITHELIAL / LPF: NONE SEEN
pH: 5 (ref 5.0–8.0)

## 2016-02-25 LAB — CBC
HEMATOCRIT: 50.3 % (ref 40.0–52.0)
Hemoglobin: 17.2 g/dL (ref 13.0–18.0)
MCH: 30.5 pg (ref 26.0–34.0)
MCHC: 34.2 g/dL (ref 32.0–36.0)
MCV: 89.1 fL (ref 80.0–100.0)
Platelets: 230 10*3/uL (ref 150–440)
RBC: 5.65 MIL/uL (ref 4.40–5.90)
RDW: 14.3 % (ref 11.5–14.5)
WBC: 6.3 10*3/uL (ref 3.8–10.6)

## 2016-02-25 LAB — LIPASE, BLOOD: LIPASE: 30 U/L (ref 11–51)

## 2016-02-25 MED ORDER — METOCLOPRAMIDE HCL 5 MG/ML IJ SOLN
10.0000 mg | Freq: Once | INTRAMUSCULAR | Status: AC
  Administered 2016-02-25: 10 mg via INTRAVENOUS
  Filled 2016-02-25: qty 2

## 2016-02-25 MED ORDER — SODIUM CHLORIDE 0.9 % IV BOLUS (SEPSIS)
1000.0000 mL | Freq: Once | INTRAVENOUS | Status: AC
  Administered 2016-02-25: 1000 mL via INTRAVENOUS

## 2016-02-25 MED ORDER — BUTALBITAL-APAP-CAFFEINE 50-325-40 MG PO TABS: 1.0000 | ORAL_TABLET | Freq: Four times a day (QID) | ORAL | 0 refills | Status: DC | PRN

## 2016-02-25 MED ORDER — SUCRALFATE 1 G PO TABS: 1.0000 g | ORAL_TABLET | Freq: Two times a day (BID) | ORAL | 0 refills | Status: DC

## 2016-02-25 MED ORDER — KETOROLAC TROMETHAMINE 30 MG/ML IJ SOLN
30.0000 mg | Freq: Once | INTRAMUSCULAR | Status: AC
  Administered 2016-02-25: 30 mg via INTRAVENOUS
  Filled 2016-02-25: qty 1

## 2016-02-25 MED ORDER — DIPHENHYDRAMINE HCL 50 MG/ML IJ SOLN
25.0000 mg | Freq: Once | INTRAMUSCULAR | Status: AC
Start: 2016-02-25 — End: 2016-02-25
  Administered 2016-02-25: 25 mg via INTRAVENOUS
  Filled 2016-02-25: qty 1

## 2016-02-25 MED ORDER — GI COCKTAIL ~~LOC~~
30.0000 mL | Freq: Once | ORAL | Status: AC
  Administered 2016-02-25: 30 mL via ORAL
  Filled 2016-02-25: qty 30

## 2016-02-25 MED ORDER — SUCRALFATE 1 G PO TABS
1.0000 g | ORAL_TABLET | Freq: Once | ORAL | Status: AC
  Administered 2016-02-25: 1 g via ORAL
  Filled 2016-02-25: qty 1

## 2016-02-25 MED ORDER — BUTALBITAL-APAP-CAFFEINE 50-325-40 MG PO TABS
2.0000 | ORAL_TABLET | Freq: Once | ORAL | Status: AC
  Administered 2016-02-25: 2 via ORAL
  Filled 2016-02-25: qty 2

## 2016-02-25 NOTE — ED Triage Notes (Signed)
Pt in with co mid abd pain for few months hx of gastritis. Denies any n.v.d. Or dysuria.
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
## 2016-02-25 NOTE — ED Notes (Signed)
Patient transported to CT 

## 2016-03-20 ENCOUNTER — Emergency Department
Admission: EM | Admit: 2016-03-20 | Discharge: 2016-03-20 | Disposition: A | Discharge status: Home / Self Care | Payer: Self-pay | Attending: Emergency Medicine | Admitting: Emergency Medicine

## 2016-03-20 DIAGNOSIS — R1084 Generalized abdominal pain: Secondary | ICD-10-CM | POA: Insufficient documentation

## 2016-03-20 DIAGNOSIS — F17210 Nicotine dependence, cigarettes, uncomplicated: Secondary | ICD-10-CM | POA: Insufficient documentation

## 2016-03-20 DIAGNOSIS — I10 Essential (primary) hypertension: Secondary | ICD-10-CM | POA: Insufficient documentation

## 2016-03-20 DIAGNOSIS — Z79899 Other long term (current) drug therapy: Secondary | ICD-10-CM | POA: Insufficient documentation

## 2016-03-20 LAB — URINALYSIS, COMPLETE (UACMP) WITH MICROSCOPIC
BILIRUBIN URINE: NEGATIVE
Bacteria, UA: NONE SEEN
GLUCOSE, UA: NEGATIVE mg/dL
HGB URINE DIPSTICK: NEGATIVE
Ketones, ur: NEGATIVE mg/dL
LEUKOCYTES UA: NEGATIVE
NITRITE: NEGATIVE
PH: 7 (ref 5.0–8.0)
Protein, ur: NEGATIVE mg/dL
RBC / HPF: NONE SEEN RBC/hpf (ref 0–5)
SPECIFIC GRAVITY, URINE: 1.008 (ref 1.005–1.030)
Squamous Epithelial / LPF: NONE SEEN

## 2016-03-20 LAB — COMPREHENSIVE METABOLIC PANEL
ALT: 23 U/L (ref 17–63)
AST: 26 U/L (ref 15–41)
Albumin: 3.7 g/dL (ref 3.5–5.0)
Alkaline Phosphatase: 44 U/L (ref 38–126)
Anion gap: 8 (ref 5–15)
BILIRUBIN TOTAL: 0.7 mg/dL (ref 0.3–1.2)
BUN: 12 mg/dL (ref 6–20)
CO2: 26 mmol/L (ref 22–32)
CREATININE: 1.04 mg/dL (ref 0.61–1.24)
Calcium: 8.6 mg/dL — ABNORMAL LOW (ref 8.9–10.3)
Chloride: 103 mmol/L (ref 101–111)
Glucose, Bld: 126 mg/dL — ABNORMAL HIGH (ref 65–99)
POTASSIUM: 3.5 mmol/L (ref 3.5–5.1)
Sodium: 137 mmol/L (ref 135–145)
TOTAL PROTEIN: 7.2 g/dL (ref 6.5–8.1)

## 2016-03-20 LAB — CBC
HEMATOCRIT: 47.4 % (ref 40.0–52.0)
Hemoglobin: 16.1 g/dL (ref 13.0–18.0)
MCH: 29.8 pg (ref 26.0–34.0)
MCHC: 33.9 g/dL (ref 32.0–36.0)
MCV: 87.8 fL (ref 80.0–100.0)
PLATELETS: 218 10*3/uL (ref 150–440)
RBC: 5.39 MIL/uL (ref 4.40–5.90)
RDW: 14.7 % — AB (ref 11.5–14.5)
WBC: 6 10*3/uL (ref 3.8–10.6)

## 2016-03-20 LAB — LIPASE, BLOOD: LIPASE: 20 U/L (ref 11–51)

## 2016-03-20 MED ORDER — GI COCKTAIL ~~LOC~~
30.0000 mL | Freq: Once | ORAL | Status: AC
  Administered 2016-03-20: 30 mL via ORAL
  Filled 2016-03-20: qty 30

## 2016-03-20 MED ORDER — PANTOPRAZOLE SODIUM 40 MG PO TBEC: 40.0000 mg | DELAYED_RELEASE_TABLET | Freq: Every day | ORAL | 1 refills | Status: DC

## 2016-03-20 NOTE — ED Provider Notes (Signed)
[facility] Emergency Department Provider Note   First MD Initiated Contact with Patient 03/20/16 [phone]     (approximate)  I have reviewed the triage vital signs and the nursing notes.   HISTORY  Chief Complaint Abdominal Pain    HPI Douglas Mcdaniel is a 56 y.o. male with history of chronic abdominal pain presents to the emergency department with "abdominal bloating and a gastric abdominal discomfort. Patient states symptoms consistent with his previous visit in December 2. Patient denies any vomiting no nausea no constipation or diarrhea.   Past Medical History:  Diagnosis Date  . GERD (gastroesophageal reflux disease)   . Hypertension     There are no active problems to display for this patient.   Past Surgical History:  Procedure Laterality Date  . head surgery      Prior to Admission medications   Medication Sig Start Date End Date Taking? Authorizing Provider  albuterol (PROVENTIL HFA;VENTOLIN HFA) 108 (90 Base) MCG/ACT inhaler Inhale 2 puffs into the lungs every 4 (four) hours as needed for wheezing or shortness of breath. 08/06/15   Irean Hong, MD  amLODipine (NORVASC) 10 MG tablet Take 10 mg by mouth daily.    Historical Provider, MD  benzonatate (TESSALON PERLES) 100 MG capsule Take 1 capsule (100 mg total) by mouth 3 (three) times daily as needed for cough (Take 1-2 per dose). 02/23/16   Jenise V Bacon Menshew, PA-C  butalbital-acetaminophen-caffeine (FIORICET, ESGIC) [phone] MG tablet Take 1-2 tablets by mouth every 6 (six) hours as needed for headache. 02/25/16 02/24/17  Rebecka Apley, MD  chlorpheniramine-HYDROcodone [facility]) 10-8 MG/5ML SUER Take 5 mLs by mouth 2 (two) times daily. 08/06/15   Irean Hong, MD  fluticasone (FLONASE) 50 MCG/ACT nasal spray Place 2 sprays into both nostrils daily. 02/23/16   Jenise V Bacon Menshew, PA-C  hydrochlorothiazide (HYDRODIURIL) 25 MG tablet Take 25 mg by mouth daily.     Historical Provider, MD  omeprazole (PRILOSEC) 40 MG capsule Take 1 capsule (40 mg total) by mouth daily. 02/06/16 02/05/17  Darci Current, MD  predniSONE (DELTASONE) 20 MG tablet 3 tablets daily 4 days 08/06/15   Irean Hong, MD  sucralfate (CARAFATE) 1 g tablet Take 1 tablet (1 g total) by mouth 2 (two) times daily. 02/25/16   Rebecka Apley, MD    Allergies Patient has no known allergies.  No family history on file.  Social History Social History  Substance Use Topics  . Smoking status: Current Every Day Smoker    Packs/day: 0.00    Types: Cigarettes  . Smokeless tobacco: Not on file  . Alcohol use Yes    Review of Systems Constitutional: No fever/chills Eyes: No visual changes. ENT: No sore throat. Cardiovascular: Denies chest pain. Respiratory: Denies shortness of breath. Gastrointestinal: Positive for abdominal pain.  No nausea, no vomiting.  No diarrhea.  No constipation. Genitourinary: Negative for dysuria. Musculoskeletal: Negative for back pain. Skin: Negative for rash. Neurological: Negative for headaches, focal weakness or numbness.  10-point ROS otherwise negative.  ____________________________________________   PHYSICAL EXAM:  VITAL SIGNS: ED Triage Vitals  Enc Vitals Group     BP 03/20/16 0049 (!) 141/82     Pulse Rate 03/20/16 0049 88     Resp 03/20/16 0049 18     Temp 03/20/16 0049 97.8 F (36.6 C)     Temp Source 03/20/16 0049 Oral     SpO2 03/20/16 0049 97 %  Weight 03/20/16 0050 210 lb (95.3 kg)     Height 03/20/16 0050 5\' 9"  (1.753 m)     Head Circumference --      Peak Flow --      Pain Score 03/20/16 0050 10     Pain Loc --      Pain Edu? --      Excl. in GC? --     Constitutional: Alert and oriented. Well appearing and in no acute distress. Eyes: Conjunctivae are normal. PERRL. EOMI. Head: Atraumatic. Mouth/Throat: Mucous membranes are moist.  Oropharynx non-erythematous. Neck: No stridor.   Cardiovascular: Normal rate,  regular rhythm. Good peripheral circulation. Grossly normal heart sounds. Respiratory: Normal respiratory effort.  No retractions. Lungs CTAB. Gastrointestinal: Soft and nontender. No distention.  Musculoskeletal: No lower extremity tenderness nor edema. No gross deformities of extremities. Neurologic:  Normal speech and language. No gross focal neurologic deficits are appreciated.  Skin:  Skin is warm, dry and intact. No rash noted.   ____________________________________________   LABS (all labs ordered are listed, but only abnormal results are displayed)  Labs Reviewed  COMPREHENSIVE METABOLIC PANEL - Abnormal; Notable for the following:       Result Value   Glucose, Bld 126 (*)    Calcium 8.6 (*)    All other components within normal limits  CBC - Abnormal; Notable for the following:    RDW 14.7 (*)    All other components within normal limits  URINALYSIS, COMPLETE (UACMP) WITH MICROSCOPIC - Abnormal; Notable for the following:    Color, Urine STRAW (*)    APPearance CLEAR (*)    All other components within normal limits  LIPASE, BLOOD   ____________________________________________  EKG  ED ECG REPORT I, Rupert N Vivianne Carles, the attending physician, personally viewed and interpreted this ECG.   Date: 03/20/2016  EKG Time: 1:02AM  Rate: 78  Rhythm: Normal Sinus Rhythm  Axis: Normal   Intervals:Normal   ST&T Change: None   Procedures    INITIAL IMPRESSION / ASSESSMENT AND PLAN / ED COURSE  Pertinent labs & imaging results that were available during my care of the patient were reviewed by me and considered in my medical decision making (see chart for details).  56 year old male with chronic abdominal pain returns to the emergency department with the same complaint. Patient given a GI cocktail with resolution of pain. We'll refer the patient to gastroenterology. Imaging of the patient's abdomen was not performed as patient had a CT scan performed last month for the  same complaint.   Clinical Course     ____________________________________________  FINAL CLINICAL IMPRESSION(S) / ED DIAGNOSES  Final diagnoses:  Generalized abdominal pain     MEDICATIONS GIVEN DURING THIS VISIT:  Medications  gi cocktail (Maalox,Lidocaine,Donnatal) (not administered)     NEW OUTPATIENT MEDICATIONS STARTED DURING THIS VISIT:  New Prescriptions   No medications on file    Modified Medications   No medications on file    Discontinued Medications   No medications on file     Note:  This document was prepared using Dragon voice recognition software and may include unintentional dictation errors.    Darci Currentandolph N Farra Nikolic, MD 03/20/16 [phone]

## 2016-03-20 NOTE — ED Notes (Signed)
In at RN Dewayne HatchAnn request to draw labs and collect urine specimen

## 2016-03-20 NOTE — ED Triage Notes (Signed)
Pt ambulatory to triage with no difficulty. Pt reports hx of gastritis. Over the last few days he has been having increasing abd pain and nausea. Pt also reports his abd is bloated.

## 2016-04-11 ENCOUNTER — Emergency Department
Admission: EM | Admit: 2016-04-11 | Discharge: 2016-04-11 | Disposition: A | Discharge status: Home / Self Care | Payer: Self-pay | Attending: Emergency Medicine | Admitting: Emergency Medicine

## 2016-04-11 ENCOUNTER — Emergency Department: Payer: Self-pay

## 2016-04-11 DIAGNOSIS — I10 Essential (primary) hypertension: Secondary | ICD-10-CM | POA: Insufficient documentation

## 2016-04-11 DIAGNOSIS — Z79899 Other long term (current) drug therapy: Secondary | ICD-10-CM | POA: Insufficient documentation

## 2016-04-11 DIAGNOSIS — R1012 Left upper quadrant pain: Secondary | ICD-10-CM | POA: Insufficient documentation

## 2016-04-11 DIAGNOSIS — F17210 Nicotine dependence, cigarettes, uncomplicated: Secondary | ICD-10-CM | POA: Insufficient documentation

## 2016-04-11 LAB — CBC
HEMATOCRIT: 48.3 % (ref 40.0–52.0)
HEMOGLOBIN: 16.1 g/dL (ref 13.0–18.0)
MCH: 29.6 pg (ref 26.0–34.0)
MCHC: 33.4 g/dL (ref 32.0–36.0)
MCV: 88.6 fL (ref 80.0–100.0)
Platelets: 237 10*3/uL (ref 150–440)
RBC: 5.45 MIL/uL (ref 4.40–5.90)
RDW: 14.7 % — AB (ref 11.5–14.5)
WBC: 7.1 10*3/uL (ref 3.8–10.6)

## 2016-04-11 LAB — LIPASE, BLOOD: LIPASE: 24 U/L (ref 11–51)

## 2016-04-11 LAB — COMPREHENSIVE METABOLIC PANEL
ALBUMIN: 4.1 g/dL (ref 3.5–5.0)
ALK PHOS: 44 U/L (ref 38–126)
ALT: 31 U/L (ref 17–63)
AST: 31 U/L (ref 15–41)
Anion gap: 6 (ref 5–15)
BUN: 15 mg/dL (ref 6–20)
CHLORIDE: 103 mmol/L (ref 101–111)
CO2: 27 mmol/L (ref 22–32)
Calcium: 8.7 mg/dL — ABNORMAL LOW (ref 8.9–10.3)
Creatinine, Ser: 1.05 mg/dL (ref 0.61–1.24)
GFR calc non Af Amer: >60 mL/min (ref >60)
Glucose, Bld: 110 mg/dL — ABNORMAL HIGH (ref 65–99)
Potassium: 3.8 mmol/L (ref 3.5–5.1)
SODIUM: 136 mmol/L (ref 135–145)
TOTAL PROTEIN: 7.2 g/dL (ref 6.5–8.1)
Total Bilirubin: 0.9 mg/dL (ref 0.3–1.2)

## 2016-04-11 LAB — URINALYSIS, COMPLETE (UACMP) WITH MICROSCOPIC
Bilirubin Urine: NEGATIVE
GLUCOSE, UA: NEGATIVE mg/dL
HGB URINE DIPSTICK: NEGATIVE
Ketones, ur: 5 mg/dL — AB
NITRITE: NEGATIVE
PROTEIN: NEGATIVE mg/dL
Specific Gravity, Urine: 1.023 (ref 1.005–1.030)
pH: 5 (ref 5.0–8.0)

## 2016-04-11 LAB — TROPONIN I: Troponin I: <0.03 ng/mL (ref <0.03)

## 2016-04-11 MED ORDER — IOPAMIDOL (ISOVUE-300) INJECTION 61%
30.0000 mL | Freq: Once | INTRAVENOUS | Status: AC | PRN
  Administered 2016-04-11: 30 mL via ORAL

## 2016-04-11 MED ORDER — GI COCKTAIL ~~LOC~~
30.0000 mL | Freq: Once | ORAL | Status: AC
  Administered 2016-04-11: 30 mL via ORAL
  Filled 2016-04-11: qty 30

## 2016-04-11 MED ORDER — IOPAMIDOL (ISOVUE-300) INJECTION 61%
100.0000 mL | Freq: Once | INTRAVENOUS | Status: AC | PRN
  Administered 2016-04-11: 100 mL via INTRAVENOUS

## 2016-04-11 NOTE — Discharge Instructions (Signed)
You were seen in the emergency room for abdominal pain. It is important that you follow up closely with Dr. Excell Seltzerooper [facility]   Please return to the emergency room right away if you are to develop a fever, severe nausea, your pain becomes severe or worsens, you are unable to keep food down, begin vomiting any dark or bloody fluid, you develop any dark or bloody stools, feel dehydrated, or other new concerns or symptoms arise.

## 2016-04-11 NOTE — ED Provider Notes (Signed)
[facility] Emergency Department Provider Note   ____________________________________________   First MD Initiated Contact with Patient 04/11/16 0730     (approximate)  I have reviewed the triage vital signs and the nursing notes.   HISTORY  Chief Complaint Abdominal Pain    HPI Douglas Mcdaniel is a 56 y.o. male for evaluation of left upper abdominal pain. Patient began experiencing pain about 1:00 this morning, reports it woke him up from sleep he describes a very crampy gas like pressure in the left upper abdomen. Reports he has a 10 year history of pain somewhat similar nature and diagnosis "gastritis" multiple times. He does CT scan a couple months ago here at the ER for similar feeling. He reports on this happens that he'll get a very crampy discomfort that radiates pain in his left upper abdomen, sometimes feels very nauseated but did not have nausea today. No vomiting. No fevers or chills. He last moved his bowels just 2 hours ago in the emergency department and reports his pain is improving quite a bit, but still very mild discomfort in the left upper abdomen  No chest pain. No shortness of breath. No vomiting. No black or bloody stools. Takes prilosec daily, but has run out recently.   Past Medical History:  Diagnosis Date  . GERD (gastroesophageal reflux disease)   . Hypertension     There are no active problems to display for this patient.   Past Surgical History:  Procedure Laterality Date  . head surgery      Prior to Admission medications   Medication Sig Start Date End Date Taking? Authorizing Provider  albuterol (PROVENTIL HFA;VENTOLIN HFA) 108 (90 Base) MCG/ACT inhaler Inhale 2 puffs into the lungs every 4 (four) hours as needed for wheezing or shortness of breath. 08/06/15   Irean Hong, MD  amLODipine (NORVASC) 10 MG tablet Take 10 mg by mouth daily.    Historical Provider, MD  benzonatate (TESSALON PERLES) 100 MG capsule Take 1  capsule (100 mg total) by mouth 3 (three) times daily as needed for cough (Take 1-2 per dose). 02/23/16   Jenise V Bacon Menshew, PA-C  butalbital-acetaminophen-caffeine (FIORICET, ESGIC) [phone] MG tablet Take 1-2 tablets by mouth every 6 (six) hours as needed for headache. 02/25/16 02/24/17  Rebecka Apley, MD  chlorpheniramine-HYDROcodone [facility]) 10-8 MG/5ML SUER Take 5 mLs by mouth 2 (two) times daily. 08/06/15   Irean Hong, MD  fluticasone (FLONASE) 50 MCG/ACT nasal spray Place 2 sprays into both nostrils daily. 02/23/16   Jenise V Bacon Menshew, PA-C  hydrochlorothiazide (HYDRODIURIL) 25 MG tablet Take 25 mg by mouth daily.    Historical Provider, MD  omeprazole (PRILOSEC) 40 MG capsule Take 1 capsule (40 mg total) by mouth daily. 02/06/16 02/05/17  Darci Current, MD  pantoprazole (PROTONIX) 40 MG tablet Take 1 tablet (40 mg total) by mouth daily. 03/20/16 03/20/17  Darci Current, MD  predniSONE (DELTASONE) 20 MG tablet 3 tablets daily 4 days 08/06/15   Irean Hong, MD  sucralfate (CARAFATE) 1 g tablet Take 1 tablet (1 g total) by mouth 2 (two) times daily. 02/25/16   Rebecka Apley, MD    Allergies Patient has no known allergies.  No family history on file.  Social History Social History  Substance Use Topics  . Smoking status: Current Every Day Smoker    Packs/day: 0.00    Types: Cigarettes  . Smokeless tobacco: Not on file  .  Alcohol use Yes    Review of Systems Constitutional: No fever/chills Eyes: No visual changes. ENT: No sore throat. Cardiovascular: Denies chest pain. Respiratory: Denies shortness of breath. Gastrointestinal:  No nausea, no vomiting.  No diarrhea.  No constipation. Genitourinary: Negative for dysuria. Musculoskeletal: Negative for back pain. Skin: Negative for rash. Neurological: Negative for headaches, focal weakness or numbness.  10-point ROS otherwise  negative.  ____________________________________________   PHYSICAL EXAM:  VITAL SIGNS: ED Triage Vitals  Enc Vitals Group     BP 04/11/16 0147 118/73     Pulse Rate 04/11/16 0147 96     Resp 04/11/16 0147 18     Temp 04/11/16 0147 97.8 F (36.6 C)     Temp Source 04/11/16 0147 Oral     SpO2 04/11/16 0147 95 %     Weight --      Height --      Head Circumference --      Peak Flow --      Pain Score 04/11/16 0131 10     Pain Loc --      Pain Edu? --      Excl. in GC? --     Constitutional: Alert and oriented. Well appearing and in no acute distress. Eyes: Conjunctivae are normal. PERRL. EOMI. Head: Atraumatic. Nose: No congestion/rhinnorhea. Mouth/Throat: Mucous membranes are moist.  Oropharynx non-erythematous. Neck: No stridor.   Cardiovascular: Normal rate, regular rhythm. Grossly normal heart sounds.  Good peripheral circulation. Respiratory: Normal respiratory effort.  No retractions. Lungs CTAB. Gastrointestinal: Soft andMild discomfort in the left upper quadrant without rebound or guarding. No pain at McBurney's point. Negative Murphy. Only tenderness reported as a left upper abdomen at this time, the patient reports his extra quite a bit better with than when he first arrived to the ER. No distention. No abdominal bruits. No CVA tenderness. Musculoskeletal: No lower extremity tenderness nor edema.   Neurologic:  Normal speech and language. No gross focal neurologic deficits are appreciated. No gait instability. Skin:  Skin is warm, dry and intact. No rash noted. Psychiatric: Mood and affect are normal. Speech and behavior are normal.  ____________________________________________   LABS (all labs ordered are listed, but only abnormal results are displayed)  Labs Reviewed  COMPREHENSIVE METABOLIC PANEL - Abnormal; Notable for the following:       Result Value   Glucose, Bld 110 (*)    Calcium 8.7 (*)    All other components within normal limits  CBC - Abnormal;  Notable for the following:    RDW 14.7 (*)    All other components within normal limits  URINALYSIS, COMPLETE (UACMP) WITH MICROSCOPIC - Abnormal; Notable for the following:    Color, Urine YELLOW (*)    APPearance CLEAR (*)    Ketones, ur 5 (*)    Leukocytes, UA TRACE (*)    Bacteria, UA RARE (*)    Squamous Epithelial / LPF 0-5 (*)    All other components within normal limits  LIPASE, BLOOD  TROPONIN I   ____________________________________________  EKG  Reviewed and to return me at 7:45 AM Heart rate 80 QRS 100 QTc 410 Normal sinus rhythm, no evidence of acute ischemic change. Possible old anteroseptal infarct ____________________________________________  RADIOLOGY  Ct Abdomen Pelvis W Contrast  Result Date: 04/11/2016 CLINICAL DATA:  Abdominal pain. EXAM: CT ABDOMEN AND PELVIS WITH CONTRAST TECHNIQUE: Multidetector CT imaging of the abdomen and pelvis was performed using the standard protocol following bolus administration of intravenous contrast. CONTRAST:  ISOVUE-300 IOPAMIDOL (ISOVUE-300) INJECTION 61% COMPARISON:  KUB from earlier today.  CT scan February 06, 2016 FINDINGS: Lower chest: No acute abnormality. Hepatobiliary: Hepatic steatosis. The liver, gallbladder, and portal vein are otherwise normal. Pancreas: Unremarkable. No pancreatic ductal dilatation or surrounding inflammatory changes. Spleen: Normal in size without focal abnormality. Adrenals/Urinary Tract: Adrenal glands are unremarkable. Kidneys are normal, without renal calculi, focal lesion, or hydronephrosis. Bladder is unremarkable. Stomach/Bowel: The stomach is normal in appearance. Most of the small bowel is normal as well. The previously identified intussusception has resolved. There is a single mildly prominent loop of small bowel in the left lateral abdomen with an air-fluid level, correlating with the finding on today's KUB. This loop of bowel is seen on series 2, image 30 measuring 3.9 cm. The adjacent  loops are normal. There is no associated wall thickening or dilatation of the more proximal bowel. The remainder of the small bowel is normal. The colon is normal in appearance. The appendix is normal as well. Vascular/Lymphatic: No significant vascular findings are present. No enlarged abdominal or pelvic lymph nodes. Reproductive: Uterus and bilateral adnexa are unremarkable. Other: Bilateral fat containing inguinal hernias. Musculoskeletal: No acute or significant osseous findings. IMPRESSION: 1. The air-fluid levels in the lateral left abdomen on the KUB from earlier today are associated with a mildly prominent loop of small bowel in the left lateral abdomen measuring up to 3.9 cm with an air-fluid level. No underlying cause is identified. No wall thickening is noted. No intussusception seen today. The findings are nonspecific but could represent a focal ileus. Recommend follow-up as clinically warranted. 2. No other acute abnormalities. Electronically Signed   By: Gerome Sam III M.D   On: 04/11/2016 11:35   Dg Abd 2 Views  Result Date: 04/11/2016 CLINICAL DATA:  Left upper quadrant and umbilical abdominal pain. No nausea or vomiting. EXAM: ABDOMEN - 2 VIEW COMPARISON:  02/06/2016 FINDINGS: Moderate colonic stool burden. Mild gas distention of several loops of small bowel within the left mid hemiabdomen with index loop measuring approximately 3.9 cm in diameter with air-fluid levels noted regional to this location. No pneumoperitoneum, pneumatosis or portal venous gas. No definitive abnormal intra-abdominal calcifications given overlying colonic stool burden. Limited visualization of lower thorax demonstrates minimal bilateral infrahilar opacities favored to represent atelectasis. Stigmata of DISH within the lower thoracic spine. No acute osseus abnormalities. IMPRESSION: Nonspecific mild gaseous distention of several loops of small bowel within the left mid hemiabdomen regional to the location of small  bowel intussusceptions seen on recent abdominal CT. While potentially of uncertain clinical significance, further evaluation with repeat abdominal CT could performed as indicated. Electronically Signed   By: Simonne Come M.D.   On: 04/11/2016 08:20    ____________________________________________   PROCEDURES  Procedure(s) performed: None  Procedures  Critical Care performed: No  ____________________________________________   INITIAL IMPRESSION / ASSESSMENT AND PLAN / ED COURSE  Pertinent labs & imaging results that were available during my care of the patient were reviewed by me and considered in my medical decision making (see chart for details).  Differential diagnosis includes but is not limited to, abdominal perforation, aortic dissection, cholecystitis, appendicitis, diverticulitis, colitis, esophagitis/gastritis, kidney stone, pyelonephritis, urinary tract infection, aortic aneurysm. All are considered in decision and treatment plan. Based upon the patient's presentation and risk factors, proceed with CT scan to evaluate for etiology of left upper quadrant pain. No evidence of acute cardiac involvement, denies cardiac symptoms. EKG and troponin are reassuring  ----------------------------------------- 12:11  PM on 04/11/2016 -----------------------------------------  Patient's CT with a potential ileus, the patient reports that all of his symptoms have resolved, he is able to drink 2 bottles of contrast, reports he had a bowel movement is now passing gas well. No evidence of ongoing obstruction, but I discussed his clinical history and CT with Dr. Rudene Reooper [facility] who recommends outpatient follow-up with him for a possible small bowel series or follow-through.  Discussed with the patient, patient is very comfortable with the plan to follow up with Dr. Excell Seltzerooper, and was given careful return precautions. Patient reports he feels well and much improved.  Return precautions and  treatment recommendations and follow-up discussed with the patient who is agreeable with the plan.        ____________________________________________   FINAL CLINICAL IMPRESSION(S) / ED DIAGNOSES  Final diagnoses:  LUQ abdominal pain      NEW MEDICATIONS STARTED DURING THIS VISIT:  New Prescriptions   No medications on file     Note:  This document was prepared using Dragon voice recognition software and may include unintentional dictation errors.     Sharyn CreamerMark Quale, MD 04/11/16 [phone]

## 2016-04-11 NOTE — ED Notes (Signed)
Pt to STAT desk wanting to leave. Encouraged to stay. Pt agreed to stay for a little longer. Stepped outside for some fresh air.

## 2016-04-11 NOTE — ED Triage Notes (Signed)
Patient reports abdominal pain that woke him up.  Denies nausea or vomiting at this time.

## 2016-04-26 ENCOUNTER — Other Ambulatory Visit: Payer: Self-pay

## 2016-04-26 DIAGNOSIS — I10 Essential (primary) hypertension: Secondary | ICD-10-CM | POA: Insufficient documentation

## 2016-04-28 ENCOUNTER — Emergency Department: Payer: Self-pay

## 2016-04-28 ENCOUNTER — Telehealth: Payer: Self-pay

## 2016-04-28 ENCOUNTER — Ambulatory Visit (INDEPENDENT_AMBULATORY_CARE_PROVIDER_SITE_OTHER): Payer: BLUE CROSS/BLUE SHIELD | Admitting: Surgery

## 2016-04-28 ENCOUNTER — Emergency Department
Admission: EM | Admit: 2016-04-28 | Discharge: 2016-04-28 | Disposition: A | Discharge status: Home / Self Care | Payer: Self-pay | Attending: Emergency Medicine | Admitting: Emergency Medicine

## 2016-04-28 ENCOUNTER — Encounter: Payer: Self-pay | Admitting: Surgery

## 2016-04-28 ENCOUNTER — Encounter: Payer: Self-pay | Admitting: Emergency Medicine

## 2016-04-28 VITALS — BP 154/84 | HR 103 | Temp 97.9°F | Ht 69.0 in | Wt 212.8 lb

## 2016-04-28 DIAGNOSIS — J40 Bronchitis, not specified as acute or chronic: Secondary | ICD-10-CM | POA: Insufficient documentation

## 2016-04-28 DIAGNOSIS — R05 Cough: Secondary | ICD-10-CM

## 2016-04-28 DIAGNOSIS — I10 Essential (primary) hypertension: Secondary | ICD-10-CM | POA: Insufficient documentation

## 2016-04-28 DIAGNOSIS — Z79899 Other long term (current) drug therapy: Secondary | ICD-10-CM | POA: Insufficient documentation

## 2016-04-28 DIAGNOSIS — R059 Cough, unspecified: Secondary | ICD-10-CM

## 2016-04-28 DIAGNOSIS — R1012 Left upper quadrant pain: Secondary | ICD-10-CM

## 2016-04-28 DIAGNOSIS — F17210 Nicotine dependence, cigarettes, uncomplicated: Secondary | ICD-10-CM | POA: Insufficient documentation

## 2016-04-28 LAB — POCT RAPID STREP A: STREPTOCOCCUS, GROUP A SCREEN (DIRECT): NEGATIVE

## 2016-04-28 MED ORDER — ALBUTEROL SULFATE HFA 108 (90 BASE) MCG/ACT IN AERS: 2.0000 | INHALATION_SPRAY | Freq: Four times a day (QID) | RESPIRATORY_TRACT | 0 refills | Status: DC | PRN

## 2016-04-28 MED ORDER — IPRATROPIUM-ALBUTEROL 0.5-2.5 (3) MG/3ML IN SOLN
3.0000 mL | Freq: Once | RESPIRATORY_TRACT | Status: AC
  Administered 2016-04-28: 3 mL via RESPIRATORY_TRACT
  Filled 2016-04-28: qty 3

## 2016-04-28 MED ORDER — PREDNISONE 20 MG PO TABS
60.0000 mg | ORAL_TABLET | Freq: Once | ORAL | Status: AC
  Administered 2016-04-28: 60 mg via ORAL
  Filled 2016-04-28: qty 3

## 2016-04-28 MED ORDER — BENZONATATE 100 MG PO CAPS: 100.0000 mg | ORAL_CAPSULE | Freq: Four times a day (QID) | ORAL | 0 refills | Status: DC | PRN

## 2016-04-28 MED ORDER — PREDNISONE 20 MG PO TABS: 60.0000 mg | ORAL_TABLET | Freq: Every day | ORAL | 0 refills | Status: DC

## 2016-04-28 MED ORDER — LIDOCAINE VISCOUS 2 % MT SOLN
15.0000 mL | Freq: Once | OROMUCOSAL | Status: AC
  Administered 2016-04-28: 15 mL via OROMUCOSAL
  Filled 2016-04-28: qty 15

## 2016-04-28 NOTE — ED Notes (Signed)
Patient transported to X-ray 

## 2016-04-28 NOTE — Progress Notes (Signed)
Surgical Consultation  04/28/2016  Douglas Mcdaniel is an 56 y.o. male.   CC: Left upper quadrant pain  HPI: This patient was recently seen in the emergency room where a CT was read as normal for left upper quadrant pain and his pain had resolved at the time of his discharge. Dr. Fanny Bien and I had discussed his findings and apparently a CT scan performed in December showed a possible intussusception. He was asked to follow-up in our office. Patient describes pain in the left upper quadrant radiating to his left lower quadrant sometimes made worse by eating he also has reflux symptoms with burning that is not relieved by omeprazole or Zantac. He's been on omeprazole or Zantac for many many years. He had an EGD in 2016 and has had a colonoscopy as well. Of significance is that he had some abnormalities identified in [place] at the [facility] facility which resulted in him having a diagnostic laparoscopy which he states was negative. That was performed for the same pain that he is having now and he was having it then and it is not improved and they did not find a source. He denies nausea or vomiting with this pain and he has no fevers or chills      Past Medical History:  Diagnosis Date  . Bilateral low back pain without sciatica 03/05/2014  . Bladder wall thickening 02/26/2014   Last Assessment & Plan:  - CT with bladder wall thickening possibly related to infection v. Neoplasm. - UA and urine cytology pending. - Urology referral for cystoscopy.  . Chronic abdominal pain 08/13/2015   Overview:  Chart Review:.  03/13/15- He underwent an exploratory laparotomy with all examined abdominal contents appearing normal including the entirety of the small bowel, appendix and colon, with no evidence of internal hernia. 03/17/15- CT: Segmental wall thickening in the proximal small bowel with mild dilatation, possibly enteritis, less likely obstruction. Correlate with recent laparoscopy.Smal  . Delayed ejaculation  08/23/2014   Last Assessment & Plan:  - Possibly related to Zoloft v. Anxiety. - Will continue to follow.  . Dental infection 02/26/2014  . Dyspepsia 01/15/2014   Last Assessment & Plan:  - Chronic, stable. - Refilled Pepcid.  . Elevated creatine kinase 07/17/2014   Last Assessment & Plan:  - Trending up 216 --> 330 --> 407 --> 295. 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  Other differential includes inflammatory myopathy (dermatomyositis v. Polymyositis) v. v. Medication effect.  Unlikely hypothyroidism as normal TSH. - Denies alcohol and cocaine use. - Continue to monitor.  [name] Epidermal inclusion cyst 08/13/2015   Last Assessment & Plan:  Discussed excision of the cyst in the future. Patient is in agreement.   . Erectile dysfunction 04/11/2014  . Generalized anxiety disorder 01/15/2014   Last Assessment & Plan:  - Counseled on importance of re-starting Zoloft 50 mg once daily. - Concern that patient has been presenting to the ED multiple times for array of symptoms likely related to anxiety. - Encouraged to make an appointment at [facility] if patient feels like he needs to go to ED as we have same-day appointments available.  Patient in agreement. - Close follow-up at [facility] in one week.  [name] GERD (gastroesophageal reflux disease)   . Hemorrhoids 04/05/2014  . Hypertension   . Left inguinal hernia 03/05/2014  . Nonintractable episodic headache 07/17/2014   Last Assessment & Plan:  - Concern that patient has been presenting to the ED with  7 visits since 3/2 for similar symptoms.  Has had multiple rounds of antibiotics.  See chart review in subjective section. - Encouraged to make an appointment at [facility] if patient feels like he needs to go to ED as we have same-day appointments available.  Patient in agreement. - Recommend continuing Flonase and using   . Screening for diabetes mellitus 01/15/2014  . Tobacco use 09/16/2014  . Toe pain, right 04/05/2014  . Tonsillitis  02/12/2014    Past Surgical History:  Procedure Laterality Date  . head surgery     Diagnostic laparoscopy History reviewed. No pertinent family history. No family history of serious medical problems  Social History:  reports that he has been smoking Cigarettes.  He has been smoking about 0.00 packs per day. He has never used smokeless tobacco. He reports that he drinks alcohol. His drug history is not on file. He works for [employer] in [place]  Allergies:  Allergies  Allergen Reactions  . Other Rash    Tomatoes-hives    Medications reviewed.   Review of Systems:   Review of Systems  Constitutional: Negative.   HENT: Negative.   Eyes: Negative.   Respiratory: Negative.   Cardiovascular: Positive for chest pain. Negative for palpitations and orthopnea.  Gastrointestinal: Positive for abdominal pain and heartburn. Negative for blood in stool, constipation, diarrhea, nausea and vomiting.  Genitourinary: Negative.   Musculoskeletal: Negative.   Skin: Negative.   Neurological: Negative.   Endo/Heme/Allergies: Negative.   Psychiatric/Behavioral: Negative.      Physical Exam:  There were no vitals taken for this visit.  Physical Exam  Constitutional: He is oriented to person, place, and time and well-developed, well-nourished, and in no distress. No distress.  HENT:  Head: Normocephalic and atraumatic.  Eyes: Pupils are equal, round, and reactive to light. Right eye exhibits no discharge. Left eye exhibits no discharge. No scleral icterus.  Neck: Normal range of motion.  Cardiovascular: Normal rate, regular rhythm and normal heart sounds.   Pulmonary/Chest: Effort normal and breath sounds normal. No respiratory distress. He has no wheezes. He has no rales.  Abdominal: Soft. He exhibits no distension. There is no tenderness. There is no rebound and no guarding.  Laparoscopy scars noted  Musculoskeletal: Normal range of motion. He exhibits no edema or  tenderness.  Lymphadenopathy:    He has no cervical adenopathy.  Neurological: He is alert and oriented to person, place, and time.  Skin: Skin is warm and dry. No rash noted. He is not diaphoretic. No erythema.  Psychiatric: Mood and affect normal.  Vitals reviewed.     Results for orders placed or performed during the hospital encounter of 04/28/16 (from the past 48 hour(s))  POCT rapid strep A [facility])     Status: None   Collection Time: 04/28/16 12:23 AM  Result Value Ref Range   Streptococcus, Group A Screen (Direct) NEGATIVE NEGATIVE   Dg Chest 2 View  Result Date: 04/28/2016 CLINICAL DATA:  56 year old male with cough and shortness of breath. EXAM: CHEST  2 VIEW COMPARISON:  Chest radiograph dated 08/06/2015 FINDINGS: There is mild eventration of the left hemidiaphragm similar to prior radiograph. The lungs are clear. There is no pleural effusion or pneumothorax. The cardiac silhouette is within normal limits. No acute osseous pathology. IMPRESSION: No active cardiopulmonary disease. Electronically Signed   By: Elgie Collard M.D.   On: 04/28/2016 02:18    Assessment/Plan:  CT scan is reviewed. I am less likely to  consider this as intussusception as a source of his pain but an upper GI small bowel series could be performed but it is unlikely to show an intussusception when he is asymptomatic. I doubt that this is a blockage of any sort as he never vomits with this pain. He has significant reflux-like symptoms and warrants evaluation by GI physician and he wants to see a different GI physician that he had had previously. I will order an upper GI small bowel series and obtain a GI consult as well. He will follow-up after this. I discussed smoking cessation in this patient with refractory reflux disease as well.  Lattie Hawichard E Susanne Baumgarner, MD, FACS

## 2016-04-28 NOTE — ED Triage Notes (Signed)
Patient reports dry cough for over a week and swelling at his "glands at his neck". Patient denies any known fevers.

## 2016-04-28 NOTE — Telephone Encounter (Signed)
Notified patient at this time of the appointments listed below. Dr.Cooper-05/19/16 @ 2:45 pm [facility] Bowel [facility] @ [facility] 05/04/16 @ 9:30 am Dr.Anna 05/18/16 @ 3:45 pm.  Appointment reminders mailed as well.

## 2016-04-28 NOTE — Patient Instructions (Signed)
We will schedule the below test for you and call with the date /time and place. We have scheduled a Gastroenterologist appointment with Dr.Kirin Tobi BastosAnna. Please see appointment listed below. We will like for you to follow up with DR.Cooper after your small bowel series imaging test. I will call with that appointment later today. Please call our office if you have questions or concerns.   Small Bowel Series A small bowel series, also called a small bowel follow-through, is a medical test in which X-rays are taken to check for problems in the small bowel. The small bowel is also called the small intestine. It connects the stomach to the large intestine (colon). This exam can be used to look for blockages, abnormal growths, inflammation, narrowing of the small bowel, and other problems. It can help to find the cause of symptoms such as abdominal pain, bloating, or diarrhea. For this exam, you swallow a white, chalky liquid called barium. A series of X-rays are done while the barium passes through the small bowel. The barium shows up well on X-rays, making it easier for your health care provider to see possible problems. Tell a health care provider about:  Any allergies you have.  All medicines you are taking, including vitamins, herbs, eye drops, creams, and over-the-counter medicines.  Any blood disorders you have.  Any surgeries you have had.  Any medical conditions you have.  Whether you are pregnant or may be pregnant.  Whether you are breastfeeding. What are the risks? Generally, this is a safe procedure. However, problems may occur, including:  Nausea after drinking the barium.  Cramps or constipation.  Worsening of a blockage in your small bowel.  Exposure to radiation (a very small amount).  Allergic reaction to the barium. This is rare. What happens before the procedure?  Follow instructions from your health care provider about eating or drinking restrictions.  Ask your health  care provider about changing or stopping your regular medicines. This is especially important if you are taking diabetes medicines or blood thinners. What happens during the procedure?  You will drink the liquid barium, which looks like a light-colored milkshake. You will probably drink the barium through a straw.  Using a type of X-ray called fluoroscopy, the health care provider will watch the flow of barium as it moves through your upper gastrointestinal tract and enters your small bowel.  Plain X-ray pictures will also be taken at frequent intervals to track the flow of barium as it moves through your small bowel. The X-rays may be taken at intervals of 15-60 minutes until the barium reaches the colon.  You may need to change positions several times, such as standing up or lying on a table. The table may move or tilt. You may need to turn from side to side. The procedure may vary among health care providers and hospitals. What happens after the procedure?  Return to your normal activities and your normal diet as told by your health care provider.  It is your responsibility to get your test results. Ask your health care provider or the department performing the test when your test results will be ready. This information is not intended to replace advice given to you by your health care provider. Make sure you discuss any questions you have with your health care provider. Document Released: 02/11/2011 Document Revised: 10/20/2015 Document Reviewed: 09/06/2014 Elsevier Interactive Patient Education  2017 [employer]

## 2016-04-28 NOTE — ED Provider Notes (Signed)
[facility] Emergency Department Provider Note   ____________________________________________   First MD Initiated Contact with Patient 04/28/16 0143     (approximate)  I have reviewed the triage vital signs and the nursing notes.   HISTORY  Chief Complaint Cough    HPI Douglas Mcdaniel is a 56 y.o. male who comes into the hospital today with swollen glands and a dry cough. The patient reports that he couldn't lay down to sleep. The symptoms started about 4-5 days ago. He's been taking Mucinex and throat spray but has not helped. The patient reports that his throat hurts a little bit as well. 12 years ago he was told he needed his tonsils out that he has never had it done. He denies any fevers or runny nose. He's had some cough with some shortness of breath. The patient also has some abdominal pain for which she is seeing an intestinal specialist tomorrow. He denies nausea and vomiting. He decided to come in to get checked out.   Past Medical History:  Diagnosis Date  . Bilateral low back pain without sciatica 03/05/2014  . Bladder wall thickening 02/26/2014   Last Assessment & Plan:  - CT with bladder wall thickening possibly related to infection v. Neoplasm. - UA and urine cytology pending. - Urology referral for cystoscopy.  . Chronic abdominal pain 08/13/2015   Overview:  Chart Review:.  03/13/15- He underwent an exploratory laparotomy with all examined abdominal contents appearing normal including the entirety of the small bowel, appendix and colon, with no evidence of internal hernia. 03/17/15- CT: Segmental wall thickening in the proximal small bowel with mild dilatation, possibly enteritis, less likely obstruction. Correlate with recent laparoscopy.Smal  . 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 Anxiety. - Will continue to follow.  . Dental infection 02/26/2014  . Dyspepsia 01/15/2014   Last Assessment &  Plan:  - Chronic, stable. - Refilled Pepcid.  . Elevated creatine kinase 07/17/2014   Last Assessment & Plan:  - Trending up 216 --> 330 --> 407 --> 295. 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  Other differential includes inflammatory myopathy (dermatomyositis v. Polymyositis) v. v. Medication effect.  Unlikely hypothyroidism as normal TSH. - Denies alcohol and cocaine use. - Continue to monitor.  [name] Epidermal inclusion cyst 08/13/2015   Last Assessment & Plan:  Discussed excision of the cyst in the future. Patient is in agreement.   . Erectile dysfunction 04/11/2014  . Generalized anxiety disorder 01/15/2014   Last Assessment & Plan:  - Counseled on importance of re-starting Zoloft 50 mg once daily. - Concern that patient has been presenting to the ED multiple times for array of symptoms likely related to anxiety. - Encouraged to make an appointment at [facility] if patient feels like he needs to go to ED as we have same-day appointments available.  Patient in agreement. - Close follow-up at [facility] in one week.  [name] GERD (gastroesophageal reflux disease)   . Hemorrhoids 04/05/2014  . Hypertension   . Left inguinal hernia 03/05/2014  . Nonintractable episodic headache 07/17/2014   Last Assessment & Plan:  - Concern that patient has been presenting to the ED with 7 visits since 3/2 for similar symptoms.  Has had multiple rounds of antibiotics.  See chart review in subjective section. - Encouraged to make an appointment at [facility] if patient feels like he needs to go to ED as  we have same-day appointments available.  Patient in agreement. - Recommend continuing Flonase and using   . Screening for diabetes mellitus 01/15/2014  . Tobacco use 09/16/2014  . Toe pain, right 04/05/2014  . Tonsillitis 02/12/2014    Patient Active Problem List   Diagnosis Date Noted  . Hypertension 04/26/2016  . Chronic abdominal pain 08/13/2015  . Epidermal inclusion cyst 08/13/2015  . Tobacco use  09/16/2014  . 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 Elevated creatine kinase 07/17/2014  . Nonintractable episodic headache 07/17/2014  . Erectile dysfunction 04/11/2014  . Hemorrhoids 04/05/2014  . Toe pain, right 04/05/2014  . Bilateral low back pain without sciatica 03/05/2014  . Left inguinal hernia 03/05/2014  . Bladder wall thickening 02/26/2014  . Dental infection 02/26/2014  . Tonsillitis 02/12/2014  . Dyspepsia 01/15/2014  . Generalized anxiety disorder 01/15/2014  . GERD (gastroesophageal reflux disease) 01/15/2014  . Screening for diabetes mellitus 01/15/2014    Past Surgical History:  Procedure Laterality Date  . head surgery      Prior to Admission medications   Medication Sig Start Date End Date Taking? Authorizing Provider  albuterol (PROVENTIL HFA;VENTOLIN HFA) 108 (90 Base) MCG/ACT inhaler Inhale 2 puffs into the lungs every 4 (four) hours as needed for wheezing or shortness of breath. 08/06/15   Irean Hong, MD  albuterol (PROVENTIL HFA;VENTOLIN HFA) 108 (90 Base) MCG/ACT inhaler Inhale 2 puffs into the lungs every 6 (six) hours as needed for wheezing or shortness of breath. 04/28/16   Rebecka Apley, MD  amLODipine (NORVASC) 10 MG tablet Take 10 mg by mouth daily.    Historical Provider, MD  benzonatate (TESSALON PERLES) 100 MG capsule Take 1 capsule (100 mg total) by mouth 3 (three) times daily as needed for cough (Take 1-2 per dose). 02/23/16   Jenise V Bacon Menshew, PA-C  benzonatate (TESSALON PERLES) 100 MG capsule Take 1 capsule (100 mg total) by mouth every 6 (six) hours as needed for cough. 04/28/16   Rebecka Apley, MD  butalbital-acetaminophen-caffeine (FIORICET, [facility]) [phone] MG tablet Take 1-2 tablets by mouth every 6 (six) hours as needed for headache. 02/25/16 02/24/17  Rebecka Apley, MD  chlorpheniramine-HYDROcodone [facility]) 10-8 MG/5ML SUER Take 5 mLs by mouth 2 (two) times daily. 08/06/15   Irean Hong, MD  cyclobenzaprine  (FLEXERIL) 10 MG tablet Take by mouth. 09/04/14   Historical Provider, MD  fluticasone (FLONASE) 50 MCG/ACT nasal spray Place 2 sprays into both nostrils daily. 02/23/16   Jenise V Bacon Menshew, PA-C  hydrochlorothiazide (HYDRODIURIL) 25 MG tablet Take 25 mg by mouth daily.    Historical Provider, MD  omeprazole (PRILOSEC) 40 MG capsule Take 1 capsule (40 mg total) by mouth daily. 02/06/16 02/05/17  Darci Current, MD  pantoprazole (PROTONIX) 40 MG tablet Take 1 tablet (40 mg total) by mouth daily. 03/20/16 03/20/17  Darci Current, MD  predniSONE (DELTASONE) 20 MG tablet 3 tablets daily 4 days 08/06/15   Irean Hong, MD  predniSONE (DELTASONE) 20 MG tablet Take 3 tablets (60 mg total) by mouth daily. 04/28/16   Rebecka Apley, MD  sertraline (ZOLOFT) 50 MG tablet Take 50 mg by mouth. 09/22/15   Historical Provider, MD  sildenafil (REVATIO) 20 MG tablet Take by mouth. 04/11/14   Historical Provider, MD  sucralfate (CARAFATE) 1 g tablet Take 1 tablet (1 g total) by mouth 2 (two) times daily. 02/25/16   Rebecka Apley, MD    Allergies Other  No  family history on file.  Social History Social History  Substance Use Topics  . Smoking status: Current Every Day Smoker    Packs/day: 0.00    Types: Cigarettes  . Smokeless tobacco: Never Used  . Alcohol use Yes    Review of Systems Constitutional: No fever/chills Eyes: No visual changes. ENT:  sore throat. Cardiovascular: Denies chest pain. Respiratory: Cough and shortness of breath. Gastrointestinal:  abdominal pain.  No nausea, no vomiting.  No diarrhea.  No constipation. Genitourinary: Negative for dysuria. Musculoskeletal: Negative for back pain. Skin: Negative for rash. Neurological: Negative for headaches, focal weakness or numbness.  10-point ROS otherwise negative.  ____________________________________________   PHYSICAL EXAM:  VITAL SIGNS: ED Triage Vitals [04/28/16 0018]  Enc Vitals Group     BP 124/75     Pulse  Rate 88     Resp 20     Temp 97.8 F (36.6 C)     Temp Source Oral     SpO2 97 %     Weight 210 lb (95.3 kg)     Height 5\' 9"  (1.753 m)     Head Circumference      Peak Flow      Pain Score 10     Pain Loc      Pain Edu?      Excl. in GC?     Constitutional: Alert and oriented. Well appearing and in Mild distress. Eyes: Conjunctivae are normal. PERRL. EOMI. Head: Atraumatic. Nose: No congestion/rhinnorhea. Mouth/Throat: Mucous membranes are moist.  Oropharynx mildly erythematous. Lymph: Cervical lympadenopathy Cardiovascular: Normal rate, regular rhythm. Grossly normal heart sounds.  Good peripheral circulation. Respiratory: Normal respiratory effort.  No retractions. Lungs CTAB. Gastrointestinal: Soft and nontender. No distention. Positive bowel sounds Musculoskeletal: No lower extremity tenderness nor edema.   Neurologic:  Normal speech and language.  Skin:  Skin is warm, dry and intact.  Psychiatric: Mood and affect are normal.   ____________________________________________   LABS (all labs ordered are listed, but only abnormal results are displayed)  Labs Reviewed  POCT RAPID STREP A   ____________________________________________  EKG  none ____________________________________________  RADIOLOGY  CXR ____________________________________________   PROCEDURES  Procedure(s) performed: None  Procedures  Critical Care performed: No  ____________________________________________   INITIAL IMPRESSION / ASSESSMENT AND PLAN / ED COURSE  Pertinent labs & imaging results that were available during my care of the patient were reviewed by me and considered in my medical decision making (see chart for details).  This is a 56 year old male who comes into the hospital today with some dry cough and swollen glands. The patient is a smoker so I will try a breathing treatment as well as some prednisone. I will also give the patient some viscous lidocaine for his  throat. I sent the patient for chest x-ray which did not show pneumonia. The patient also received a strep that was negative. I think that the patient has bronchitis causing his symptoms. I will discharge him home with some prednisone and an inhaler. The patient will also receive some benzonatate. The patient should follow-up with his primary care physician for further evaluation of these symptoms. He is sleeping without significant coughing in the emergency department.  Clinical Course as of Apr 28 228  Wed Apr 28, 2016  0222 No active cardiopulmonary disease. DG Chest 2 View [AW]    Clinical Course User Index [AW] Rebecka ApleyAllison P Eymi Lipuma, MD     ____________________________________________   FINAL CLINICAL IMPRESSION(S) / ED DIAGNOSES  Final diagnoses:  Cough  Bronchitis      NEW MEDICATIONS STARTED DURING THIS VISIT:  New Prescriptions   ALBUTEROL (PROVENTIL HFA;VENTOLIN HFA) 108 (90 BASE) MCG/ACT INHALER    Inhale 2 puffs into the lungs every 6 (six) hours as needed for wheezing or shortness of breath.   BENZONATATE (TESSALON PERLES) 100 MG CAPSULE    Take 1 capsule (100 mg total) by mouth every 6 (six) hours as needed for cough.   PREDNISONE (DELTASONE) 20 MG TABLET    Take 3 tablets (60 mg total) by mouth daily.     Note:  This document was prepared using Dragon voice recognition software and may include unintentional dictation errors.    Rebecka Apley, MD 04/28/16 0230

## 2016-04-28 NOTE — Addendum Note (Signed)
Addended by: Cameron ProudHILDERS, Miyonna Ormiston S on: 04/28/2016 10:08 AM   Modules accepted: Orders

## 2016-04-30 ENCOUNTER — Telehealth: Payer: Self-pay

## 2016-04-30 NOTE — Telephone Encounter (Signed)
Patient called inquiring about his appointment information. I read the note that Velna HatchetSheila documented to the patient. He is aware of his appointment and will call us if he has any questions.

## 2016-05-04 ENCOUNTER — Ambulatory Visit: Payer: MEDICAID

## 2016-05-04 ENCOUNTER — Telehealth: Payer: Self-pay

## 2016-05-04 NOTE — Telephone Encounter (Signed)
Spoke with patient at this time. He did not go to have Small Bowel series done, and so I rescheduled it for 05/11/16 @ 8:15 am, NPO after midnight. Patient verbalized understanding.He was reminded of his follow up appointment with Dr Tobi BastosAnna and Dr.Cooper.

## 2016-05-11 ENCOUNTER — Ambulatory Visit: Admission: RE | Admit: 2016-05-11 | Payer: MEDICAID | Source: Ambulatory Visit

## 2016-05-11 ENCOUNTER — Telehealth: Payer: Self-pay

## 2016-05-11 NOTE — Telephone Encounter (Signed)
Unable to reach patient due to mailbox not set up.  Tried reaching patient in regards to no show x2 for Small Bowel Imaging.  When I speak with patient I will provide number to reschedule his [phone] and once  this has been scheduled  He can let me know and I  will make a follow up appointment to see Dr.Cooper.  I have cancelled the follow up appointment with DR.Cooper on 05/19/16.

## 2016-05-12 NOTE — Telephone Encounter (Signed)
Patient called office at this time to state he missed his small bowel series yesterday due to being sick with a cold.  Number [phone]  was provided for him to call to schedule and he will call our office once he has the test done to be put on Dr.Cooper's schedule for follow up.

## 2016-05-14 ENCOUNTER — Ambulatory Visit
Admission: RE | Admit: 2016-05-14 | Discharge: 2016-05-14 | Disposition: A | Discharge status: Home / Self Care | Payer: BLUE CROSS/BLUE SHIELD | Source: Ambulatory Visit | Attending: Surgery | Admitting: Surgery

## 2016-05-14 DIAGNOSIS — R1012 Left upper quadrant pain: Secondary | ICD-10-CM | POA: Insufficient documentation

## 2016-05-18 ENCOUNTER — Encounter: Payer: Self-pay | Admitting: Gastroenterology

## 2016-05-18 ENCOUNTER — Ambulatory Visit (INDEPENDENT_AMBULATORY_CARE_PROVIDER_SITE_OTHER): Payer: Self-pay | Admitting: Gastroenterology

## 2016-05-18 ENCOUNTER — Other Ambulatory Visit
Admission: RE | Admit: 2016-05-18 | Discharge: 2016-05-18 | Disposition: A | Discharge status: Home / Self Care | Payer: Self-pay | Source: Ambulatory Visit | Attending: Gastroenterology | Admitting: Gastroenterology

## 2016-05-18 VITALS — BP 106/67 | HR 91 | Temp 97.5°F | Ht 69.0 in | Wt 212.0 lb

## 2016-05-18 DIAGNOSIS — R1012 Left upper quadrant pain: Secondary | ICD-10-CM

## 2016-05-18 DIAGNOSIS — K219 Gastro-esophageal reflux disease without esophagitis: Secondary | ICD-10-CM

## 2016-05-18 DIAGNOSIS — K9289 Other specified diseases of the digestive system: Secondary | ICD-10-CM

## 2016-05-18 MED ORDER — DICYCLOMINE HCL 10 MG PO CAPS: 10.0000 mg | ORAL_CAPSULE | Freq: Three times a day (TID) | ORAL | 0 refills | Status: DC

## 2016-05-18 MED ORDER — DOXYCYCLINE HYCLATE 100 MG PO CAPS: 100.0000 mg | ORAL_CAPSULE | Freq: Two times a day (BID) | ORAL | 0 refills | Status: DC

## 2016-05-18 NOTE — Progress Notes (Signed)
Gastroenterology Consultation  Referring Provider:     Dr Excell Seltzer  Primary Care Physician:  No PCP Per Patient Primary Gastroenterologist:  Dr. Wyline Mood  Reason for Consultation:     GERD        HPI:   Douglas Mcdaniel is a 56 y.o. y/o male referred for consultation & management  by Dr. Bonnetta Barry PCP Per Patient.    He has been referred for  Acid reflux.  He was recently seen at Dr Randolm Idol office . Apparently a CT scan in December showed intusucception . Per Dr Randolm Idol note has had a diagnostic laparoscopy(1/2017by Dr Augustine Radar the entire small bowel was run which were normal  ) at [facility] which was negative. He has been referred to me by Dr Excell Seltzer for GERD. He has last been seen by [facility] Dr Marcella Dubs in 2016 when he was diagnosed with functional dyspepsia. EGD in 2016 showed gastritis. Colonoscopy last was in 2016  Where two small polyps were resected and next due in 2021. SBFT 05/2016 was normal.   CT abdomen in 04/2016 was normal except for a possible ileus.   He says that he is here today for abdominal pain. USG   Abdominal pain: Onset: 17 years back , got a bit worse, gets the pain every , each episode lasts hours  Site :epigastrium and LUQ Radiation: LLQ and center of his abdomen  Severity :upto 10/10  Nature of pain: squeeze  Aggravating factors: eating , usually gets the pain within 20-30 minutes, most severe of the pain lasts upto 30 minutes  Relieving factors :bowel movement or passing gas  Weight loss: gained  NSAID use: none  PPI use :omeperazole 40 mg , takes before meals, since going to 40 mg has helped Gall bladder surgery: intact Frequency of bowel movements: daily , sometimes hard , takes miralax which helps  Change in bowel movements: no  Relief with bowel movements: yes  Gas/Bloating/Abdominal distension: yes with distension, he consumes sweetened tea once daily , also consumes sodas occasionally.     Past Medical History:  Diagnosis Date  . Bilateral low back pain  without sciatica 03/05/2014  . Bladder wall thickening 02/26/2014   Last Assessment & Plan:  - CT with bladder wall thickening possibly related to infection v. Neoplasm. - UA and urine cytology pending. - Urology referral for cystoscopy.  . Chronic abdominal pain 08/13/2015   Overview:  Chart Review:.  03/13/15- He underwent an exploratory laparotomy with all examined abdominal contents appearing normal including the entirety of the small bowel, appendix and colon, with no evidence of internal hernia. 03/17/15- CT: Segmental wall thickening in the proximal small bowel with mild dilatation, possibly enteritis, less likely obstruction. Correlate with recent laparoscopy.Smal  . 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 Anxiety. - Will continue to follow.  . Dental infection 02/26/2014  . Dyspepsia 01/15/2014   Last Assessment & Plan:  - Chronic, stable. - Refilled Pepcid.  . Elevated creatine kinase 07/17/2014   Last Assessment & Plan:  - Trending up 216 --> 330 --> 407 --> 295. 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  Other differential includes inflammatory myopathy (dermatomyositis v. Polymyositis) v. v. Medication effect.  Unlikely hypothyroidism as normal TSH. - Denies alcohol and cocaine use. - Continue to monitor.  [name] Epidermal inclusion cyst 08/13/2015   Last Assessment & Plan:  Discussed excision of the cyst in the  future. Patient is in agreement.   . Erectile dysfunction 04/11/2014  . Generalized anxiety disorder 01/15/2014   Last Assessment & Plan:  - Counseled on importance of re-starting Zoloft 50 mg once daily. - Concern that patient has been presenting to the ED multiple times for array of symptoms likely related to anxiety. - [facility] feels like he needs to go to ED as we have same-day appointments available.  Patient in agreement. - [facility].  [name]. GERD (gastroesophageal reflux disease)   . Hemorrhoids 04/05/2014  . Hypertension   . Left inguinal hernia 03/05/2014  . Nonintractable episodic headache 07/17/2014   Last Assessment & Plan:  - Concern that patient has been presenting to the ED with 7 visits since 3/2 for similar symptoms.  Has had multiple rounds of antibiotics.  See chart review in subjective section. - [facility] if patient feels like he needs to go to ED as we have same-day appointments available.  Patient in agreement. - Recommend continuing Flonase and using   . Screening for diabetes mellitus 01/15/2014  . Tobacco use 09/16/2014  . Toe pain, right 04/05/2014  . Tonsillitis 02/12/2014    Past Surgical History:  Procedure Laterality Date  . COLONOSCOPY    . COLONOSCOPY W/ POLYPECTOMY  02/07/2015  . head surgery    . head surgery    . LAPAROSCOPY ABDOMEN DIAGNOSTIC  03/13/2015  . UPPER GASTROINTESTINAL ENDOSCOPY  04/29/2014    Prior to Admission medications   Medication Sig Start Date End Date Taking? Authorizing Provider  albuterol (PROVENTIL HFA;VENTOLIN HFA) 108 (90 Base) MCG/ACT inhaler Inhale 2 puffs into the lungs every 4 (four) hours as needed for wheezing or shortness of breath. 08/06/15   Irean HongJade J Sung, MD  albuterol (PROVENTIL HFA;VENTOLIN HFA) 108 (90 Base) MCG/ACT inhaler Inhale 2 puffs into the lungs every 6 (six) hours as needed for wheezing or shortness of breath. 04/28/16   Rebecka ApleyAllison P Webster, MD  amLODipine (NORVASC) 10 MG tablet Take 10 mg by mouth daily.    Historical Provider, MD  amoxicillin-clavulanate (AUGMENTIN) 875-125 MG tablet  05/05/16   Historical Provider, MD  benzonatate (TESSALON PERLES) 100 MG capsule Take 1 capsule (100 mg total) by mouth 3 (three) times daily as needed for cough (Take 1-2 per dose). 02/23/16   Jenise V Bacon Menshew, PA-C  benzonatate (TESSALON PERLES) 100 MG capsule Take 1 capsule (100 mg total) by mouth every 6 (six) hours as needed  for cough. 04/28/16   Rebecka ApleyAllison P Webster, MD  butalbital-acetaminophen-caffeine (FIORICET, [facility]) [phone] MG tablet Take 1-2 tablets by mouth every 6 (six) hours as needed for headache. 02/25/16 02/24/17  Rebecka ApleyAllison P Webster, MD  chlorpheniramine-HYDROcodone [facility](TUSSIONEX PENNKINETIC ER) 10-8 MG/5ML SUER Take 5 mLs by mouth 2 (two) times daily. 08/06/15   Irean HongJade J Sung, MD  cyclobenzaprine (FLEXERIL) 10 MG tablet Take by mouth. 09/04/14   Historical Provider, MD  fluticasone (FLONASE) 50 MCG/ACT nasal spray Place 2 sprays into both nostrils daily. 02/23/16   Jenise V Bacon Menshew, PA-C  hydrochlorothiazide (HYDRODIURIL) 25 MG tablet Take 25 mg by mouth daily.    Historical Provider, MD  omeprazole (PRILOSEC) 40 MG capsule Take 1 capsule (40 mg total) by mouth daily. 02/06/16 02/05/17  Darci Currentandolph N Brown, MD  pantoprazole (PROTONIX) 40 MG tablet Take 1 tablet (40 mg total) by mouth daily. 03/20/16 03/20/17  Darci Currentandolph N Brown, MD  predniSONE (  DELTASONE) 20 MG tablet 3 tablets daily 4 days 08/06/15   Irean Hong, MD  predniSONE (DELTASONE) 20 MG tablet Take 3 tablets (60 mg total) by mouth daily. 04/28/16   Rebecka Apley, MD  sertraline (ZOLOFT) 50 MG tablet Take 50 mg by mouth. 09/22/15   Historical Provider, MD  sildenafil (REVATIO) 20 MG tablet Take by mouth. 04/11/14   Historical Provider, MD  sucralfate (CARAFATE) 1 g tablet Take 1 tablet (1 g total) by mouth 2 (two) times daily. 02/25/16   Rebecka Apley, MD  VIRTUSSIN A/C 100-10 MG/5ML syrup  05/05/16   Historical Provider, MD    Family History  Problem Relation Age of Onset  . Diabetes Mother   . Multiple sclerosis Brother   . Breast cancer Maternal Aunt   . Lung cancer Maternal Grandfather   . Stomach cancer Paternal Grandmother   . Colon cancer Maternal Uncle      Social History  Substance Use Topics  . Smoking status: Current Every Day Smoker    Packs/day: 0.25    Years: 10.00    Types: Cigarettes  . Smokeless tobacco: Never Used  .  Alcohol use Yes    Allergies as of 05/18/2016 - Review Complete 04/28/2016  Allergen Reaction Noted  . Other Rash 11/06/2014    Review of Systems:    All systems reviewed and negative except where noted in HPI.   Physical Exam:  There were no vitals taken for this visit. No LMP for male patient. Psych:  Alert and cooperative. Normal mood and affect. General:   Alert,  Well-developed, well-nourished, pleasant and cooperative in NAD Head:  Normocephalic and atraumatic. Eyes:  Sclera clear, no icterus.   Conjunctiva pink. Ears:  Normal auditory acuity. Nose:  No deformity, discharge, or lesions. Mouth:  No deformity or lesions,oropharynx pink & moist. Neck:  Supple; no masses or thyromegaly. Lungs:  Respirations even and unlabored.  Clear throughout to auscultation.   No wheezes, crackles, or rhonchi. No acute distress. Heart:  Regular rate and rhythm; no murmurs, clicks, rubs, or gallops. Abdomen:  Normal bowel sounds.  No bruits.  Soft, non-tender and non-distended without masses, hepatosplenomegaly or hernias noted.  No guarding or rebound tenderness.    Pulses:  Normal pulses noted. Extremities:  No clubbing or edema.  No cyanosis. Neurologic:  Alert and oriented x3;  grossly normal neurologically. Psych:  Alert and cooperative. Normal mood and affect.  Imaging Studies: Dg Chest 2 View  Result Date: 04/28/2016 CLINICAL DATA:  56 year old male with cough and shortness of breath. EXAM: CHEST  2 VIEW COMPARISON:  Chest radiograph dated 08/06/2015 FINDINGS: There is mild eventration of the left hemidiaphragm similar to prior radiograph. The lungs are clear. There is no pleural effusion or pneumothorax. The cardiac silhouette is within normal limits. No acute osseous pathology. IMPRESSION: No active cardiopulmonary disease. Electronically Signed   By: Elgie Collard M.D.   On: 04/28/2016 02:18   Dg Small Bowel  Result Date: 05/14/2016 CLINICAL DATA:  Abdominal pain left upper  quadrant. EXAM: SMALL BOWEL SERIES COMPARISON:  CT abdomen pelvis 04/11/2016 TECHNIQUE: Following ingestion of thin barium, serial small bowel images were obtained including spot views of the terminal ileum. FLUOROSCOPY TIME:  Fluoroscopy Time:  0 minutes 54 second Radiation Exposure Index (if provided by the fluoroscopic device): Number of Acquired Spot Images: 0 FINDINGS: Rapid small-bowel transit time approximately 20 minutes. Negative for bowel obstruction. No bowel edema. Small bowel normal in caliber. Small bowel loops  are pliable and readily mobile upon palpation. No bowel mass or edema is identified. Terminal ileum negative. IMPRESSION: Relatively rapid small-bowel transit time. Otherwise within normal limits. Electronically Signed   By: Marlan Palau M.D.   On: 05/14/2016 09:33    Assessment and Plan:   Douglas Mcdaniel is a 56 y.o. y/o male has been referred for GERD/abdominal pain. It appears based on his history his dyspeptic symptoms may be from a combination of GERD( has improved with PPI) +/- constipation (better with miralax)+/- gas/bloating which may be from smoking and small bowel bacterial overgrowth. If no better at next visit will get EGD.   Plan   1. Stop all sodas and artificial sugars in diet due to bloating  2. Continue Omeprazole 40 mg daily , avoid eating for 2 hours before bed time.  3. USG RUQ and HIDA scan as he has post prandial pain.  4. Bentyl trial  5. Check for celiac disease and stool H pylori 6. Course of doxycycline to empirically treat any small bowel bacterial overgrowth 7. Daily miralax 8. Stop smoking as it can cause bloating and gas issues.  Follow up in 10-12 weeks    Dr Wyline Mood MD

## 2016-05-19 ENCOUNTER — Other Ambulatory Visit
Admission: RE | Admit: 2016-05-19 | Discharge: 2016-05-19 | Disposition: A | Discharge status: Home / Self Care | Payer: BLUE CROSS/BLUE SHIELD | Source: Ambulatory Visit | Attending: Gastroenterology | Admitting: Gastroenterology

## 2016-05-19 ENCOUNTER — Ambulatory Visit: Payer: Self-pay | Admitting: Surgery

## 2016-05-19 DIAGNOSIS — K9289 Other specified diseases of the digestive system: Secondary | ICD-10-CM | POA: Diagnosis present

## 2016-05-20 ENCOUNTER — Ambulatory Visit (INDEPENDENT_AMBULATORY_CARE_PROVIDER_SITE_OTHER): Payer: BLUE CROSS/BLUE SHIELD | Admitting: Surgery

## 2016-05-20 ENCOUNTER — Encounter: Payer: Self-pay | Admitting: Surgery

## 2016-05-20 VITALS — BP 147/85 | HR 90 | Temp 97.8°F | Ht >= 80 in | Wt 220.4 lb

## 2016-05-20 DIAGNOSIS — R1012 Left upper quadrant pain: Secondary | ICD-10-CM | POA: Diagnosis not present

## 2016-05-20 LAB — CELIAC DISEASE PANEL
ENDOMYSIAL ANTIBODY IGA: NEGATIVE
IGA: 219 mg/dL (ref 90–386)

## 2016-05-20 NOTE — Progress Notes (Signed)
Outpatient Surgical Follow Up  05/20/2016  Douglas Mcdaniel is an 56 y.o. male.   CC: Left upper quadrant pain  HPI: Patient continues to have left upper quadrant pain someone somewhat postprandial and always related with fatty foods as well as spicy foods. He seems to have reflux especially at night. He continues to smoke and has not filled the medications that Dr. Tobi BastosAnna suggested for him and prescribed for him previously.  Past Medical History:  Diagnosis Date  . Bilateral low back pain without sciatica 03/05/2014  . Bladder wall thickening 02/26/2014   Last Assessment & Plan:  - CT with bladder wall thickening possibly related to infection v. Neoplasm. - UA and urine cytology pending. - Urology referral for cystoscopy.  . Chronic abdominal pain 08/13/2015   Overview:  Chart Review:.  03/13/15- He underwent an exploratory laparotomy with all examined abdominal contents appearing normal including the entirety of the small bowel, appendix and colon, with no evidence of internal hernia. 03/17/15- CT: Segmental wall thickening in the proximal small bowel with mild dilatation, possibly enteritis, less likely obstruction. Correlate with recent laparoscopy.Smal  . 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 Anxiety. - Will continue to follow.  . Dental infection 02/26/2014  . Dyspepsia 01/15/2014   Last Assessment & Plan:  - Chronic, stable. - Refilled Pepcid.  . Elevated creatine kinase 07/17/2014   Last Assessment & Plan:  - Trending up 216 --> 330 --> 407 --> 295. 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  Other differential includes inflammatory myopathy (dermatomyositis v. Polymyositis) v. v. Medication effect.  Unlikely hypothyroidism as normal TSH. - Denies alcohol and cocaine use. - Continue to monitor.  [name]. Epidermal inclusion cyst 08/13/2015   Last Assessment & Plan:  Discussed excision of the cyst in the future.  Patient is in agreement.   . Erectile dysfunction 04/11/2014  . Generalized anxiety disorder 01/15/2014   Last Assessment & Plan:  - Counseled on importance of re-starting Zoloft 50 mg once daily. - Concern that patient has been presenting to the ED multiple times for array of symptoms likely related to anxiety. - [facility] if patient feels like he needs to go to ED as we have same-day appointments available.  Patient in agreement. - [facility]  [name]. GERD (gastroesophageal reflux disease)   . Hemorrhoids 04/05/2014  . Hypertension   . Left inguinal hernia 03/05/2014  . Nonintractable episodic headache 07/17/2014   Last Assessment & Plan:  - Concern that patient has been presenting to the ED with 7 visits since 3/2 for similar symptoms.  Has had multiple rounds of antibiotics.  See chart review in subjective section. - [facility] patient feels like he needs to go to ED as we have same-day appointments available.  Patient in agreement. - Recommend continuing Flonase and using   . Screening for diabetes mellitus 01/15/2014  . Tobacco use 09/16/2014  . Toe pain, right 04/05/2014  . Tonsillitis 02/12/2014    Past Surgical History:  Procedure Laterality Date  . COLONOSCOPY    . COLONOSCOPY W/ POLYPECTOMY  02/07/2015  . head surgery    . head surgery    . LAPAROSCOPY ABDOMEN DIAGNOSTIC  03/13/2015  . UPPER GASTROINTESTINAL ENDOSCOPY  04/29/2014    Family History  Problem Relation Age of Onset  . Diabetes Mother   .  Multiple sclerosis Brother   . Breast cancer Maternal Aunt   . Lung cancer Maternal Grandfather   . Stomach cancer Paternal Grandmother   . Colon cancer Maternal Uncle     Social History:  reports that he has been smoking Cigarettes.  He has a 2.50 pack-year smoking history. He has never used smokeless tobacco. He reports that he drinks alcohol. He reports that he does not use drugs.  Allergies:   Allergies  Allergen Reactions  . Other Rash    Tomatoes-hives    Medications reviewed.   Review of Systems:   Review of Systems  Constitutional: Negative.   HENT: Negative.   Eyes: Negative.   Respiratory: Negative.   Cardiovascular: Negative.   Gastrointestinal: Positive for abdominal pain, heartburn and nausea. Negative for blood in stool, constipation, diarrhea, melena and vomiting.  Genitourinary: Negative.   Musculoskeletal: Negative.   Skin: Negative.   Neurological: Negative.   Endo/Heme/Allergies: Negative.   Psychiatric/Behavioral: Negative.      Physical Exam:  BP (!) 147/85   Pulse 90   Temp 97.8 F (36.6 C) (Oral)   Ht 7\' 5"  (2.261 m)   Wt 220 lb 6.4 oz (100 kg)   BMI 19.56 kg/m   Physical Exam  Constitutional: He is oriented to person, place, and time and well-developed, well-nourished, and in no distress. No distress.  HENT:  Head: Normocephalic and atraumatic.  Eyes: Pupils are equal, round, and reactive to light. Right eye exhibits no discharge. Left eye exhibits no discharge. No scleral icterus.  Neck: Normal range of motion.  Cardiovascular: Normal rate and regular rhythm.   Pulmonary/Chest: Effort normal. No respiratory distress.  Abdominal: Soft. He exhibits no distension and no mass. There is no tenderness. There is no rebound and no guarding.  Musculoskeletal: Normal range of motion. He exhibits no edema.  Lymphadenopathy:    He has no cervical adenopathy.  Neurological: He is alert and oriented to person, place, and time. Gait normal.  Skin: Skin is warm and dry. He is not diaphoretic. No erythema.  Psychiatric: Mood and affect normal.  Vitals reviewed.     No results found for this or any previous visit (from the past 48 hour(s)). No results found.  Assessment/Plan:  Upper GI personally reviewed. Patient also had what sounds like a guaiac test done yesterday was results are not accessible to me and were done and Dr. Johnney Killian  office. Small bowel series fails to identify any sign of intussusception or evidence of prior intussusception in the form of bowel edema etc.  With this patient as had a prior laparoscopy for this pain which was completely negative and a negative workup to date now I believe this is all likely related to reflux disease and the patient has not stop smoking nor has he been filled his medications. At this point I see no sign of surgical needs and the patient will follow-up with [place] on an as-needed basis but he continues to follow with Dr. Eber Jones, MD, FACS

## 2016-05-20 NOTE — Patient Instructions (Signed)
Please call our office if you have questions or concerns.   

## 2016-05-21 ENCOUNTER — Telehealth: Payer: Self-pay

## 2016-05-21 LAB — H. PYLORI ANTIGEN, STOOL: H. PYLORI STOOL AG, EIA: NEGATIVE

## 2016-05-21 NOTE — Telephone Encounter (Signed)
Advised pt results per Dr. Tobi BastosAnna.

## 2016-05-21 NOTE — Telephone Encounter (Signed)
-----   Message from Wyline MoodKiran Anna, MD sent at 05/21/2016 10:54 AM EDT ----- h pylori negative

## 2016-06-03 ENCOUNTER — Ambulatory Visit
Admission: RE | Admit: 2016-06-03 | Discharge: 2016-06-03 | Disposition: A | Discharge status: Home / Self Care | Payer: BLUE CROSS/BLUE SHIELD | Source: Ambulatory Visit | Attending: Gastroenterology | Admitting: Gastroenterology

## 2016-06-03 ENCOUNTER — Ambulatory Visit: Payer: BLUE CROSS/BLUE SHIELD

## 2016-06-03 DIAGNOSIS — R1012 Left upper quadrant pain: Secondary | ICD-10-CM

## 2016-06-10 DIAGNOSIS — Y999 Unspecified external cause status: Secondary | ICD-10-CM | POA: Insufficient documentation

## 2016-06-10 DIAGNOSIS — I10 Essential (primary) hypertension: Secondary | ICD-10-CM | POA: Insufficient documentation

## 2016-06-10 DIAGNOSIS — Z79899 Other long term (current) drug therapy: Secondary | ICD-10-CM | POA: Diagnosis not present

## 2016-06-10 DIAGNOSIS — F17210 Nicotine dependence, cigarettes, uncomplicated: Secondary | ICD-10-CM | POA: Insufficient documentation

## 2016-06-10 DIAGNOSIS — S161XXA Strain of muscle, fascia and tendon at neck level, initial encounter: Secondary | ICD-10-CM | POA: Diagnosis not present

## 2016-06-10 DIAGNOSIS — Y929 Unspecified place or not applicable: Secondary | ICD-10-CM | POA: Insufficient documentation

## 2016-06-10 DIAGNOSIS — X509XXA Other and unspecified overexertion or strenuous movements or postures, initial encounter: Secondary | ICD-10-CM | POA: Insufficient documentation

## 2016-06-10 DIAGNOSIS — S4991XA Unspecified injury of right shoulder and upper arm, initial encounter: Secondary | ICD-10-CM | POA: Diagnosis present

## 2016-06-10 DIAGNOSIS — S46911A Strain of unspecified muscle, fascia and tendon at shoulder and upper arm level, right arm, initial encounter: Secondary | ICD-10-CM | POA: Insufficient documentation

## 2016-06-10 DIAGNOSIS — Y9389 Activity, other specified: Secondary | ICD-10-CM | POA: Insufficient documentation

## 2016-06-11 ENCOUNTER — Emergency Department
Admission: EM | Admit: 2016-06-11 | Discharge: 2016-06-11 | Disposition: A | Discharge status: Home / Self Care | Payer: BLUE CROSS/BLUE SHIELD | Attending: Emergency Medicine | Admitting: Emergency Medicine

## 2016-06-11 ENCOUNTER — Encounter: Payer: Self-pay | Admitting: Emergency Medicine

## 2016-06-11 DIAGNOSIS — S46911A Strain of unspecified muscle, fascia and tendon at shoulder and upper arm level, right arm, initial encounter: Secondary | ICD-10-CM

## 2016-06-11 DIAGNOSIS — S161XXA Strain of muscle, fascia and tendon at neck level, initial encounter: Secondary | ICD-10-CM

## 2016-06-11 MED ORDER — LIDOCAINE 5 % EX PTCH: 1.0000 | MEDICATED_PATCH | CUTANEOUS | 0 refills | Status: DC

## 2016-06-11 MED ORDER — DIAZEPAM 5 MG PO TABS: 5.0000 mg | ORAL_TABLET | Freq: Three times a day (TID) | ORAL | 0 refills | Status: DC | PRN

## 2016-06-11 MED ORDER — KETOROLAC TROMETHAMINE 30 MG/ML IJ SOLN
60.0000 mg | Freq: Once | INTRAMUSCULAR | Status: AC
  Administered 2016-06-11: 60 mg via INTRAMUSCULAR
  Filled 2016-06-11: qty 2

## 2016-06-11 MED ORDER — LIDOCAINE 5 % EX PTCH
1.0000 | MEDICATED_PATCH | CUTANEOUS | Status: DC
  Administered 2016-06-11: 1 via TRANSDERMAL
  Filled 2016-06-11: qty 1

## 2016-06-11 MED ORDER — DIAZEPAM 5 MG PO TABS: 5.0000 mg | ORAL_TABLET | Freq: Once | ORAL | Status: DC

## 2016-06-11 NOTE — ED Notes (Signed)
Reviewed d/c instructions, follow-up care, prescriptions, use of sling and moist heat with patient. Pt verbalized understanding.

## 2016-06-11 NOTE — Discharge Instructions (Signed)
1. Wear sling as needed for comfort. 2. Apply lidocaine patch to affected area as prescribed. 3. Take muscle relaxer as needed. 4. Apply moist heat to affected area several times daily. 5. Return to the ER for worsening symptoms, persistent vomiting, difficulty breathing or other concerns.

## 2016-06-11 NOTE — ED Notes (Signed)
Pt reports he is having right shoulder pain that started tonight - pt denies any injury

## 2016-06-11 NOTE — ED Provider Notes (Signed)
[facility] Emergency Department Provider Note   ____________________________________________   First MD Initiated Contact with Patient 06/11/16 0246     (approximate)  I have reviewed the triage vital signs and the nursing notes.   HISTORY  Chief Complaint Shoulder Pain    HPI Douglas Mcdaniel is a 56 y.o. male who presents to the ED from home with a chief complaint of right neck and shoulder pain. 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 Denies falling or striking right shoulder. He was seen at [facility] ED, diagnosed with cervical strain and prescribed Valium and lidocaine patches. States he has been taking Motrin without relief of symptoms.Denies associated extremity weakness, numbness or tingling. Denies fever, chills, chest pain, shortness of breath, abdominal pain, nausea, vomiting. Denies recent travel or trauma. Nothing makes his pain better. Movement makes his pain worse.   Past Medical History:  Diagnosis Date  . Bilateral low back pain without sciatica 03/05/2014  . Bladder wall thickening 02/26/2014   Last Assessment & Plan:  - CT with bladder wall thickening possibly related to infection v. Neoplasm. - UA and urine cytology pending. - Urology referral for cystoscopy.  . Chronic abdominal pain 08/13/2015   Overview:  Chart Review:.  03/13/15- He underwent an exploratory laparotomy with all examined abdominal contents appearing normal including the entirety of the small bowel, appendix and colon, with no evidence of internal hernia. 03/17/15- CT: Segmental wall thickening in the proximal small bowel with mild dilatation, possibly enteritis, less likely obstruction. Correlate with recent laparoscopy.Smal  . 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 Anxiety. - Will continue to follow.  . Dental infection 02/26/2014  . Dyspepsia 01/15/2014   Last Assessment &  Plan:  - Chronic, stable. - Refilled Pepcid.  . Elevated creatine kinase 07/17/2014   Last Assessment & Plan:  - Trending up 216 --> 330 --> 407 --> 295. 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  Other differential includes inflammatory myopathy (dermatomyositis v. Polymyositis) v. v. Medication effect.  Unlikely hypothyroidism as normal TSH. - Denies alcohol and cocaine use. - Continue to monitor.  [name] Epidermal inclusion cyst 08/13/2015   Last Assessment & Plan:  Discussed excision of the cyst in the future. Patient is in agreement.   . Erectile dysfunction 04/11/2014  . Generalized anxiety disorder 01/15/2014   Last Assessment & Plan:  - Counseled on importance of re-starting Zoloft 50 mg once daily. - Concern that patient has been presenting to the ED multiple times for array of symptoms likely related to anxiety. - Encouraged to make an appointment at [facility] if patient feels like he needs to go to ED as we have same-day appointments available.  Patient in agreement. - Close follow-up at [facility] in one week.  [name] GERD (gastroesophageal reflux disease)   . Hemorrhoids 04/05/2014  . Hypertension   . Left inguinal hernia 03/05/2014  . Nonintractable episodic headache 07/17/2014   Last Assessment & Plan:  - Concern that patient has been presenting to the ED with 7 visits since 3/2 for similar symptoms.  Has had multiple rounds of antibiotics.  See chart review in subjective section. - Encouraged to make an appointment at [facility] if patient feels like he needs to go to ED as we have same-day appointments available.  Patient in agreement. - Recommend continuing Flonase and using   .  Screening for diabetes mellitus 01/15/2014  . Tobacco use 09/16/2014  . Toe pain, right 04/05/2014  . Tonsillitis 02/12/2014    Patient Active Problem List   Diagnosis Date Noted  . Hypertension 04/26/2016  . Chronic abdominal pain 08/13/2015  . Epidermal inclusion cyst 08/13/2015  . Tobacco use  09/16/2014  . 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 Elevated creatine kinase 07/17/2014  . Nonintractable episodic headache 07/17/2014  . Erectile dysfunction 04/11/2014  . Hemorrhoids 04/05/2014  . Toe pain, right 04/05/2014  . Bilateral low back pain without sciatica 03/05/2014  . Left inguinal hernia 03/05/2014  . Bladder wall thickening 02/26/2014  . Dental infection 02/26/2014  . Tonsillitis 02/12/2014  . Dyspepsia 01/15/2014  . Generalized anxiety disorder 01/15/2014  . GERD (gastroesophageal reflux disease) 01/15/2014  . Screening for diabetes mellitus 01/15/2014    Past Surgical History:  Procedure Laterality Date  . COLONOSCOPY    . COLONOSCOPY W/ POLYPECTOMY  02/07/2015  . head surgery    . head surgery    . LAPAROSCOPY ABDOMEN DIAGNOSTIC  03/13/2015  . UPPER GASTROINTESTINAL ENDOSCOPY  04/29/2014    Prior to Admission medications   Medication Sig Start Date End Date Taking? Authorizing Provider  albuterol (PROVENTIL HFA;VENTOLIN HFA) 108 (90 Base) MCG/ACT inhaler Inhale 2 puffs into the lungs every 4 (four) hours as needed for wheezing or shortness of breath. 08/06/15   Irean Hong, MD  albuterol (PROVENTIL HFA;VENTOLIN HFA) 108 (90 Base) MCG/ACT inhaler Inhale 2 puffs into the lungs every 6 (six) hours as needed for wheezing or shortness of breath. 04/28/16   Rebecka Apley, MD  amLODipine (NORVASC) 10 MG tablet Take 10 mg by mouth daily.    Historical Provider, MD  benzonatate (TESSALON PERLES) 100 MG capsule Take 1 capsule (100 mg total) by mouth 3 (three) times daily as needed for cough (Take 1-2 per dose). 02/23/16   Jenise V Bacon Menshew, PA-C  benzonatate (TESSALON PERLES) 100 MG capsule Take 1 capsule (100 mg total) by mouth every 6 (six) hours as needed for cough. 04/28/16   Rebecka Apley, MD  butalbital-acetaminophen-caffeine (FIORICET, [facility]) [phone] MG tablet Take 1-2 tablets by mouth every 6 (six) hours as needed for headache. 02/25/16 02/24/17  Rebecka Apley, MD  chlorpheniramine-HYDROcodone [facility]) 10-8 MG/5ML SUER Take 5 mLs by mouth 2 (two) times daily. 08/06/15   Irean Hong, MD  cyclobenzaprine (FLEXERIL) 10 MG tablet Take by mouth. 09/04/14   Historical Provider, MD  dicyclomine (BENTYL) 10 MG capsule Take 1 capsule (10 mg total) by mouth 4 (four) times daily -  before meals and at bedtime. 05/18/16   Wyline Mood, MD  fluticasone (FLONASE) 50 MCG/ACT nasal spray Place 2 sprays into both nostrils daily. 02/23/16   Jenise V Bacon Menshew, PA-C  hydrochlorothiazide (HYDRODIURIL) 25 MG tablet Take 25 mg by mouth daily.    Historical Provider, MD  omeprazole (PRILOSEC) 40 MG capsule Take 1 capsule (40 mg total) by mouth daily. 02/06/16 02/05/17  Darci Current, MD  pantoprazole (PROTONIX) 40 MG tablet Take 1 tablet (40 mg total) by mouth daily. 03/20/16 03/20/17  Darci Current, MD  predniSONE (DELTASONE) 20 MG tablet 3 tablets daily 4 days 08/06/15   Irean Hong, MD  predniSONE (DELTASONE) 20 MG tablet Take 3 tablets (60 mg total) by mouth daily. 04/28/16   Rebecka Apley, MD  sertraline (ZOLOFT) 50 MG tablet Take 50 mg by mouth. 09/22/15   Historical Provider, MD  sildenafil (  REVATIO) 20 MG tablet Take by mouth. 04/11/14   Historical Provider, MD  sucralfate (CARAFATE) 1 g tablet Take 1 tablet (1 g total) by mouth 2 (two) times daily. 02/25/16   Rebecka Apley, MD  VIRTUSSIN A/C 100-10 MG/5ML syrup  05/05/16   Historical Provider, MD    Allergies Other  Family History  Problem Relation Age of Onset  . Diabetes Mother   . Multiple sclerosis Brother   . Breast cancer Maternal Aunt   . Lung cancer Maternal Grandfather   . Stomach cancer Paternal Grandmother   . Colon cancer Maternal Uncle     Social History Social History  Substance Use Topics  . Smoking status: Current Every Day Smoker    Packs/day: 0.25    Years: 10.00    Types: Cigarettes  . Smokeless tobacco: Never Used  . Alcohol use Yes    Review of  Systems  Constitutional: No fever/chills. Eyes: No visual changes. ENT: No sore throat. Cardiovascular: Denies chest pain. Respiratory: Denies shortness of breath. Gastrointestinal: No abdominal pain.  No nausea, no vomiting.  No diarrhea.  No constipation. Genitourinary: Negative for dysuria. Musculoskeletal: Positive for right neck and shoulder pain. Negative for back pain. Skin: Negative for rash. Neurological: Negative for headaches, focal weakness or numbness.  10-point ROS otherwise negative.  ____________________________________________   PHYSICAL EXAM:  VITAL SIGNS: ED Triage Vitals  Enc Vitals Group     BP 06/11/16 0002 136/80     Pulse Rate 06/11/16 0002 97     Resp 06/11/16 0002 20     Temp 06/11/16 0002 98.3 F (36.8 C)     Temp Source 06/11/16 0002 Oral     SpO2 06/11/16 0002 96 %     Weight 06/11/16 0003 215 lb (97.5 kg)     Height 06/11/16 0003  (1.753 m)     Head Circumference --      Peak Flow --      Pain Score 06/11/16 0002 10     Pain Loc --      Pain Edu? --      Excl. in GC? --     Constitutional: Alert and oriented. Well appearing and in no acute distress. Eyes: Conjunctivae are normal. PERRL. EOMI. Head: Atraumatic. Nose: No congestion/rhinnorhea. Mouth/Throat: Mucous membranes are moist.  Oropharynx non-erythematous. Neck: No stridor.  No cervical spine tenderness to palpation.  Right SCM muscle spasms and tenderness turning neck against resistance. No carotid bruits. Cardiovascular: Normal rate, regular rhythm. Grossly normal heart sounds.  Good peripheral circulation. Respiratory: Normal respiratory effort.  No retractions. Lungs CTAB. Gastrointestinal: Soft and nontender. No distention. No abdominal bruits. No CVA tenderness. Musculoskeletal: Right shoulder is not tender to palpation. Instead it hurts the patient to range his shoulder secondary to neck pain. No lower extremity tenderness nor edema.  No joint effusions. Neurologic:   Normal speech and language. No gross focal neurologic deficits are appreciated. No gait instability. Skin:  Skin is warm, dry and intact. No rash noted. Psychiatric: Mood and affect are normal. Speech and behavior are normal.  ____________________________________________   LABS (all labs ordered are listed, but only abnormal results are displayed)  Labs Reviewed - No data to display ____________________________________________  EKG  None ____________________________________________  RADIOLOGY  None ____________________________________________   PROCEDURES  Procedure(s) performed: None  Procedures  Critical Care performed: No  ____________________________________________   INITIAL IMPRESSION / ASSESSMENT AND PLAN / ED COURSE  Pertinent labs & imaging results that were available during  my care of the patient were reviewed by me and considered in my medical decision making (see chart for details).  56 year old male who presents with cervical strain. Patient tells me he does not know where his prescription from [facility] was sent. On review of the [place] drug database, I do not see that he was recently dispensed Valium or lidocaine patches. Will physically hand patient a prescription for limited quantity Valium and lidocaine patches to take to his pharmacy. Sling provided for comfort. Patient will follow-up with orthopedics as needed. Strict return precautions given. Patient verbalizes understanding and agrees with plan of care.  I reviewed the patient's prescription history over the last 12 months in the multi-state controlled substances database(s) that includes [place], [place], [place], [place], [place], [place], [place], [place], [place], [place], [place], [place], [place], and [place].  Results were notable for Virtussin AC liquid dispensed 05/05/2016, Norco #15 dispensed 03/15/2016. There is no indication that patient has been dispensed  Valium or lidocaine patches.      ____________________________________________   FINAL CLINICAL IMPRESSION(S) / ED DIAGNOSES  Final diagnoses:  Strain of right shoulder, initial encounter  Cervical strain, acute, initial encounter      NEW MEDICATIONS STARTED DURING THIS VISIT:  New Prescriptions   No medications on file     Note:  This document was prepared using Dragon voice recognition software and may include unintentional dictation errors.    Irean Hong, MD 06/11/16 [phone]

## 2016-06-11 NOTE — ED Triage Notes (Signed)
Pt ambulatory to triage in NAD, reports hurt neck and right shoulder closing a door on a moving truck yesterday, was seen at an ED and dx with pulled muscle.  Pt reports taking extra strength ibuprofen w/o relief.  Pt reports he is in ED by himself at this time.

## 2016-06-12 ENCOUNTER — Emergency Department: Payer: BLUE CROSS/BLUE SHIELD

## 2016-06-12 ENCOUNTER — Encounter: Payer: Self-pay | Admitting: Emergency Medicine

## 2016-06-12 ENCOUNTER — Emergency Department
Admission: EM | Admit: 2016-06-12 | Discharge: 2016-06-12 | Disposition: A | Discharge status: Home / Self Care | Payer: BLUE CROSS/BLUE SHIELD | Attending: Emergency Medicine | Admitting: Emergency Medicine

## 2016-06-12 DIAGNOSIS — S46911D Strain of unspecified muscle, fascia and tendon at shoulder and upper arm level, right arm, subsequent encounter: Secondary | ICD-10-CM | POA: Diagnosis not present

## 2016-06-12 DIAGNOSIS — J189 Pneumonia, unspecified organism: Secondary | ICD-10-CM | POA: Insufficient documentation

## 2016-06-12 DIAGNOSIS — I10 Essential (primary) hypertension: Secondary | ICD-10-CM | POA: Diagnosis not present

## 2016-06-12 DIAGNOSIS — S161XXD Strain of muscle, fascia and tendon at neck level, subsequent encounter: Secondary | ICD-10-CM | POA: Diagnosis not present

## 2016-06-12 DIAGNOSIS — Z79899 Other long term (current) drug therapy: Secondary | ICD-10-CM | POA: Diagnosis not present

## 2016-06-12 DIAGNOSIS — S4991XD Unspecified injury of right shoulder and upper arm, subsequent encounter: Secondary | ICD-10-CM | POA: Diagnosis present

## 2016-06-12 DIAGNOSIS — X501XXD Overexertion from prolonged static or awkward postures, subsequent encounter: Secondary | ICD-10-CM | POA: Insufficient documentation

## 2016-06-12 DIAGNOSIS — F17210 Nicotine dependence, cigarettes, uncomplicated: Secondary | ICD-10-CM | POA: Insufficient documentation

## 2016-06-12 MED ORDER — KETOROLAC TROMETHAMINE 60 MG/2ML IM SOLN
60.0000 mg | Freq: Once | INTRAMUSCULAR | Status: AC
  Administered 2016-06-12: 60 mg via INTRAMUSCULAR
  Filled 2016-06-12: qty 2

## 2016-06-12 MED ORDER — GI COCKTAIL ~~LOC~~
30.0000 mL | Freq: Once | ORAL | Status: AC
  Administered 2016-06-12: 30 mL via ORAL
  Filled 2016-06-12: qty 30

## 2016-06-12 MED ORDER — AZITHROMYCIN 250 MG PO TABS: ORAL_TABLET | ORAL | 0 refills | Status: AC

## 2016-06-12 MED ORDER — CYCLOBENZAPRINE HCL 10 MG PO TABS
10.0000 mg | ORAL_TABLET | Freq: Once | ORAL | Status: AC
  Administered 2016-06-12: 10 mg via ORAL
  Filled 2016-06-12: qty 1

## 2016-06-12 MED ORDER — CYCLOBENZAPRINE HCL 10 MG PO TABS: 10.0000 mg | ORAL_TABLET | Freq: Three times a day (TID) | ORAL | 0 refills | Status: DC | PRN

## 2016-06-12 NOTE — ED Triage Notes (Signed)
Pt ambulatory to rm 17, report cough, fever, body aches since Wednesday, reports SOB and weakness as well.

## 2016-06-12 NOTE — ED Provider Notes (Signed)
[facility] Emergency Department Provider Note   First MD Initiated Contact with Patient 06/12/16 [phone]     (approximate)  I have reviewed the triage vital signs and the nursing notes.   HISTORY  Chief Complaint Fever; Cough; and Shortness of Breath   HPI Douglas Mcdaniel is a 56 y.o. male with [place] of chronic medical conditions presents to the emergency department with 2 new right upper back/shoulder pain 3 days. Patient states the pain began when he closed a heavy truck door and has been persistently worsening since then. Patient states that he was seen at [facility] for the same as well as here at [facility] yesterday. Patient states pain however is persistent current pain score is 10 out of 10. Patient denies any headache  no dizziness no nausea,   Past Medical History:  Diagnosis Date  . Bilateral low back pain without sciatica 03/05/2014  . Bladder wall thickening 02/26/2014   Last Assessment & Plan:  - CT with bladder wall thickening possibly related to infection v. Neoplasm. - UA and urine cytology pending. - Urology referral for cystoscopy.  . Chronic abdominal pain 08/13/2015   Overview:  Chart Review:.  03/13/15- He underwent an exploratory laparotomy with all examined abdominal contents appearing normal including the entirety of the small bowel, appendix and colon, with no evidence of internal hernia. 03/17/15- CT: Segmental wall thickening in the proximal small bowel with mild dilatation, possibly enteritis, less likely obstruction. Correlate with recent laparoscopy.Smal  . 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 Anxiety. - Will continue to follow.  . Dental infection 02/26/2014  . Dyspepsia 01/15/2014   Last Assessment & Plan:  - Chronic, stable. - Refilled Pepcid.  . Elevated creatine kinase 07/17/2014   Last Assessment & Plan:  - Trending up 216 --> 330 --> 407 --> 295. - Possibly related to  muscle strain and dehydration with new job working outdoors.  Other differential includes inflammatory myopathy (dermatomyositis v. Polymyositis) v. v. Medication effect.  Unlikely hypothyroidism as normal TSH. - Denies alcohol and cocaine use. - Continue to monitor.  [name] Epidermal inclusion cyst 08/13/2015   Last Assessment & Plan:  Discussed excision of the cyst in the future. Patient is in agreement.   . Erectile dysfunction 04/11/2014  . Generalized anxiety disorder 01/15/2014   Last Assessment & Plan:  - Counseled on importance of re-starting Zoloft 50 mg once daily. - Concern that patient has been presenting to the ED multiple times for array of symptoms likely related to anxiety. - Encouraged to make an appointment at [facility] if patient feels like he needs to go to ED as we have same-day appointments available.  Patient in agreement. - Close follow-up at [facility] in one week.  [name] GERD (gastroesophageal reflux disease)   . Hemorrhoids 04/05/2014  . Hypertension   . Left inguinal hernia 03/05/2014  . Nonintractable episodic headache 07/17/2014   Last Assessment & Plan:  - Concern that patient has been presenting to the ED with 7 visits since 3/2 for similar symptoms.  Has had multiple rounds of antibiotics.  See chart review in subjective section. - Encouraged to make an appointment at [facility] if patient feels like he needs to go to ED as we have same-day appointments available.  Patient in agreement. - Recommend continuing Flonase and using   . Screening for diabetes mellitus 01/15/2014  . Tobacco use 09/16/2014  . Toe pain, right 04/05/2014  .  Tonsillitis 02/12/2014    Patient Active Problem List   Diagnosis Date Noted  . Hypertension 04/26/2016  . Chronic abdominal pain 08/13/2015  . Epidermal inclusion cyst 08/13/2015  . Tobacco use 09/16/2014  . 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 Elevated creatine kinase 07/17/2014  . Nonintractable episodic headache 07/17/2014  . Erectile dysfunction 04/11/2014  .  Hemorrhoids 04/05/2014  . Toe pain, right 04/05/2014  . Bilateral low back pain without sciatica 03/05/2014  . Left inguinal hernia 03/05/2014  . Bladder wall thickening 02/26/2014  . Dental infection 02/26/2014  . Tonsillitis 02/12/2014  . Dyspepsia 01/15/2014  . Generalized anxiety disorder 01/15/2014  . GERD (gastroesophageal reflux disease) 01/15/2014  . Screening for diabetes mellitus 01/15/2014    Past Surgical History:  Procedure Laterality Date  . COLONOSCOPY    . COLONOSCOPY W/ POLYPECTOMY  02/07/2015  . head surgery    . head surgery    . LAPAROSCOPY ABDOMEN DIAGNOSTIC  03/13/2015  . UPPER GASTROINTESTINAL ENDOSCOPY  04/29/2014    Prior to Admission medications   Medication Sig Start Date End Date Taking? Authorizing Provider  albuterol (PROVENTIL HFA;VENTOLIN HFA) 108 (90 Base) MCG/ACT inhaler Inhale 2 puffs into the lungs every 4 (four) hours as needed for wheezing or shortness of breath. 08/06/15   Irean Hong, MD  albuterol (PROVENTIL HFA;VENTOLIN HFA) 108 (90 Base) MCG/ACT inhaler Inhale 2 puffs into the lungs every 6 (six) hours as needed for wheezing or shortness of breath. 04/28/16   Rebecka Apley, MD  amLODipine (NORVASC) 10 MG tablet Take 10 mg by mouth daily.    Historical Provider, MD  azithromycin (ZITHROMAX Z-PAK) 250 MG tablet Take 2 tablets (500 mg) on  Day 1,  followed by 1 tablet (250 mg) once daily on Days 2 through 5. 06/12/16 06/17/16  Darci Current, MD  benzonatate (TESSALON PERLES) 100 MG capsule Take 1 capsule (100 mg total) by mouth 3 (three) times daily as needed for cough (Take 1-2 per dose). 02/23/16   Jenise V Bacon Menshew, PA-C  benzonatate (TESSALON PERLES) 100 MG capsule Take 1 capsule (100 mg total) by mouth every 6 (six) hours as needed for cough. 04/28/16   Rebecka Apley, MD  butalbital-acetaminophen-caffeine (FIORICET, [facility]) [phone] MG tablet Take 1-2 tablets by mouth every 6 (six) hours as needed for headache. 02/25/16 02/24/17   Rebecka Apley, MD  chlorpheniramine-HYDROcodone [facility]) 10-8 MG/5ML SUER Take 5 mLs by mouth 2 (two) times daily. 08/06/15   Irean Hong, MD  cyclobenzaprine (FLEXERIL) 10 MG tablet Take by mouth. 09/04/14   Historical Provider, MD  cyclobenzaprine (FLEXERIL) 10 MG tablet Take 1 tablet (10 mg total) by mouth 3 (three) times daily as needed. 06/12/16   Darci Current, MD  diazepam (VALIUM) 5 MG tablet Take 1 tablet (5 mg total) by mouth every 8 (eight) hours as needed for anxiety. 06/11/16   Irean Hong, MD  dicyclomine (BENTYL) 10 MG capsule Take 1 capsule (10 mg total) by mouth 4 (four) times daily -  before meals and at bedtime. 05/18/16   Wyline Mood, MD  fluticasone (FLONASE) 50 MCG/ACT nasal spray Place 2 sprays into both nostrils daily. 02/23/16   Jenise V Bacon Menshew, PA-C  hydrochlorothiazide (HYDRODIURIL) 25 MG tablet Take 25 mg by mouth daily.    Historical Provider, MD  lidocaine (LIDODERM) 5 % Place 1 patch onto the skin daily. Remove & Discard patch within 12 hours or as directed by MD 06/11/16   Lesly Rubenstein  Lorene Dy, MD  omeprazole (PRILOSEC) 40 MG capsule Take 1 capsule (40 mg total) by mouth daily. 02/06/16 02/05/17  Darci Current, MD  pantoprazole (PROTONIX) 40 MG tablet Take 1 tablet (40 mg total) by mouth daily. 03/20/16 03/20/17  Darci Current, MD  predniSONE (DELTASONE) 20 MG tablet 3 tablets daily 4 days 08/06/15   Irean Hong, MD  predniSONE (DELTASONE) 20 MG tablet Take 3 tablets (60 mg total) by mouth daily. 04/28/16   Rebecka Apley, MD  sertraline (ZOLOFT) 50 MG tablet Take 50 mg by mouth. 09/22/15   Historical Provider, MD  sildenafil (REVATIO) 20 MG tablet Take by mouth. 04/11/14   Historical Provider, MD  sucralfate (CARAFATE) 1 g tablet Take 1 tablet (1 g total) by mouth 2 (two) times daily. 02/25/16   Rebecka Apley, MD  VIRTUSSIN A/C 100-10 MG/5ML syrup  05/05/16   Historical Provider, MD    Allergies Other  Family History  Problem Relation Age of  Onset  . Diabetes Mother   . Multiple sclerosis Brother   . Breast cancer Maternal Aunt   . Lung cancer Maternal Grandfather   . Stomach cancer Paternal Grandmother   . Colon cancer Maternal Uncle     Social History Social History  Substance Use Topics  . Smoking status: Current Every Day Smoker    Packs/day: 0.25    Years: 10.00    Types: Cigarettes  . Smokeless tobacco: Never Used  . Alcohol use Yes    Review of Systems Constitutional: No fever/chills Eyes: No visual changes. ENT: No sore throat. Cardiovascular: Denies chest pain. Respiratory: Denies shortness of breath. Gastrointestinal: No abdominal pain.  No nausea, no vomiting.  No diarrhea.  No constipation. Genitourinary: Negative for dysuria. Musculoskeletal: Positive for right upper back pain. Skin: Negative for rash. Neurological: Negative for headaches, focal weakness or numbness.  10-point ROS otherwise negative.  ____________________________________________   PHYSICAL EXAM:  VITAL SIGNS: ED Triage Vitals [06/12/16 0042]  Enc Vitals Group     BP 130/81     Pulse Rate 97     Resp 20     Temp 98.8 F (37.1 C)     Temp Source Oral     SpO2 96 %     Weight 215 lb (97.5 kg)     Height  (1.753 m)     Head Circumference      Peak Flow      Pain Score 10     Pain Loc      Pain Edu?      Excl. in GC?     Constitutional: Alert and oriented. Well appearing and in no acute distress. Eyes: Conjunctivae are normal. PERRL. EOMI. Head: Atraumatic. Mouth/Throat: Mucous membranes are moist.  Oropharynx non-erythematous. Neck: No stridor.  No cervical spine tenderness to palpation. Cardiovascular: Normal rate, regular rhythm. Good peripheral circulation. Grossly normal heart sounds. Respiratory: Normal respiratory effort.  No retractions. Lungs CTAB. Gastrointestinal: Soft and nontender. No distention.  Musculoskeletal: No lower extremity tenderness nor edema. No gross deformities of extremities. Pain  with palpation of the right rhomboid muscles as well as the right trapezius muscle. Neurologic:  Normal speech and language. No gross focal neurologic deficits are appreciated.  Skin:  Skin is warm, dry and intact. No rash noted. Psychiatric: Mood and affect are normal. Speech and behavior are normal.   RADIOLOGY I, [place] N Tamarcus Condie, personally viewed and evaluated these images (plain radiographs) as part of my medical decision making,  as well as reviewing the written report by the radiologist.  Dg Chest 2 View  Result Date: 06/12/2016 CLINICAL DATA:  56 y/o  M; cough and fever. EXAM: CHEST  2 VIEW COMPARISON:  None. FINDINGS: Stable normal cardiac silhouette. Streaky opacities at the lung bases bilaterally. No pleural effusion or pneumothorax. No acute osseous abnormality is evident. IMPRESSION: Streaky opacities at lung bases bilaterally may represent atelectasis or developing infiltrates. Electronically Signed   By: Mitzi Hansen M.D.   On: 06/12/2016 01:18      Procedures   ____________________________________________   INITIAL IMPRESSION / ASSESSMENT AND PLAN / ED COURSE  Pertinent labs & imaging results that were available during my care of the patient were reviewed by me and considered in my medical decision making (see chart for details).  Patient given 60 mg IM Toradol and Flexeril 10 mg. We'll prescribe Flexeril for the patient at home.      ____________________________________________  FINAL CLINICAL IMPRESSION(S) / ED DIAGNOSES  Final diagnoses:  Strain of right shoulder, subsequent encounter  Cervical strain, acute, subsequent encounter  Community acquired pneumonia, unspecified laterality     MEDICATIONS GIVEN DURING THIS VISIT:  Medications  gi cocktail (Maalox,Lidocaine,Donnatal) (not administered)  ketorolac (TORADOL) injection 60 mg (60 mg Intramuscular Given 06/12/16 0059)  cyclobenzaprine (FLEXERIL) tablet 10 mg (10 mg Oral Given 06/12/16  0059)     NEW OUTPATIENT MEDICATIONS STARTED DURING THIS VISIT:  New Prescriptions   AZITHROMYCIN (ZITHROMAX Z-PAK) 250 MG TABLET    Take 2 tablets (500 mg) on  Day 1,  followed by 1 tablet (250 mg) once daily on Days 2 through 5.   CYCLOBENZAPRINE (FLEXERIL) 10 MG TABLET    Take 1 tablet (10 mg total) by mouth 3 (three) times daily as needed.    Modified Medications   No medications on file    Discontinued Medications   No medications on file     Note:  This document was prepared using Dragon voice recognition software and may include unintentional dictation errors.    Darci Current, MD 06/12/16 [phone]

## 2016-06-12 NOTE — ED Notes (Signed)
Pt states flexeril is going to give him reflux. Pt requesting reflux medication. md notified. Pt to xray.

## 2016-06-23 ENCOUNTER — Ambulatory Visit: Payer: Self-pay | Admitting: Surgery

## 2016-07-10 ENCOUNTER — Encounter: Payer: Self-pay | Admitting: Emergency Medicine

## 2016-07-10 ENCOUNTER — Emergency Department: Payer: BLUE CROSS/BLUE SHIELD

## 2016-07-10 ENCOUNTER — Emergency Department
Admission: EM | Admit: 2016-07-10 | Discharge: 2016-07-10 | Disposition: A | Discharge status: Home / Self Care | Payer: BLUE CROSS/BLUE SHIELD | Attending: Emergency Medicine | Admitting: Emergency Medicine

## 2016-07-10 DIAGNOSIS — R0602 Shortness of breath: Secondary | ICD-10-CM | POA: Diagnosis not present

## 2016-07-10 DIAGNOSIS — I10 Essential (primary) hypertension: Secondary | ICD-10-CM | POA: Insufficient documentation

## 2016-07-10 DIAGNOSIS — R0789 Other chest pain: Secondary | ICD-10-CM | POA: Diagnosis present

## 2016-07-10 DIAGNOSIS — F17210 Nicotine dependence, cigarettes, uncomplicated: Secondary | ICD-10-CM | POA: Insufficient documentation

## 2016-07-10 DIAGNOSIS — R1013 Epigastric pain: Secondary | ICD-10-CM | POA: Diagnosis not present

## 2016-07-10 LAB — TROPONIN I
Troponin I: <0.03 ng/mL (ref <0.03)
Troponin I: <0.03 ng/mL (ref <0.03)

## 2016-07-10 LAB — CBC
HEMATOCRIT: 47.1 % (ref 40.0–52.0)
Hemoglobin: 16 g/dL (ref 13.0–18.0)
MCH: 30.2 pg (ref 26.0–34.0)
MCHC: 34 g/dL (ref 32.0–36.0)
MCV: 88.7 fL (ref 80.0–100.0)
Platelets: 206 10*3/uL (ref 150–440)
RBC: 5.31 MIL/uL (ref 4.40–5.90)
RDW: 14.4 % (ref 11.5–14.5)
WBC: 5.6 10*3/uL (ref 3.8–10.6)

## 2016-07-10 LAB — COMPREHENSIVE METABOLIC PANEL
ALBUMIN: 3.8 g/dL (ref 3.5–5.0)
ALK PHOS: 35 U/L — AB (ref 38–126)
ALT: 29 U/L (ref 17–63)
AST: 31 U/L (ref 15–41)
Anion gap: 5 (ref 5–15)
BILIRUBIN TOTAL: 0.8 mg/dL (ref 0.3–1.2)
BUN: 15 mg/dL (ref 6–20)
CALCIUM: 8.4 mg/dL — AB (ref 8.9–10.3)
CO2: 29 mmol/L (ref 22–32)
Chloride: 102 mmol/L (ref 101–111)
Creatinine, Ser: 1.09 mg/dL (ref 0.61–1.24)
GFR calc Af Amer: >60 mL/min (ref >60)
GLUCOSE: 114 mg/dL — AB (ref 65–99)
Potassium: 3.6 mmol/L (ref 3.5–5.1)
Sodium: 136 mmol/L (ref 135–145)
TOTAL PROTEIN: 6.8 g/dL (ref 6.5–8.1)

## 2016-07-10 LAB — LIPASE, BLOOD: Lipase: 23 U/L (ref 11–51)

## 2016-07-10 MED ORDER — SUCRALFATE 1 G PO TABS
1.0000 g | ORAL_TABLET | Freq: Once | ORAL | Status: AC
  Administered 2016-07-10: 1 g via ORAL

## 2016-07-10 MED ORDER — SUCRALFATE 1 G PO TABS: 1.0000 g | ORAL_TABLET | Freq: Two times a day (BID) | ORAL | 0 refills | Status: DC

## 2016-07-10 MED ORDER — GI COCKTAIL ~~LOC~~
ORAL | Status: AC
  Filled 2016-07-10: qty 30

## 2016-07-10 MED ORDER — SUCRALFATE 1 G PO TABS
ORAL_TABLET | ORAL | Status: AC
  Filled 2016-07-10: qty 1

## 2016-07-10 MED ORDER — GI COCKTAIL ~~LOC~~
30.0000 mL | Freq: Once | ORAL | Status: AC
Start: 2016-07-10 — End: 2016-07-10
  Administered 2016-07-10: 30 mL via ORAL

## 2016-07-10 NOTE — ED Notes (Signed)
This RN had to wake patient to administer medications.

## 2016-07-10 NOTE — ED Provider Notes (Signed)
[facility] Emergency Department Provider Note   ____________________________________________   First MD Initiated Contact with Patient 07/10/16 [phone]     (approximate)  I have reviewed the triage vital signs and the nursing notes.   HISTORY  Chief Complaint Gastroesophageal Reflux    HPI Douglas Mcdaniel is a 56 y.o. male who comes into the hospital today with pressure in his abdomen. He reports that the pressure is pushing up into his chest and is given him chest tightness. The patient reports that he tried taking some Maalox but it didn't help at home. He reports that at home he had some chicken and mashed potatoes with gravy. The patient has had some abdominal pain with chest pain due to reflux in the past but he reports it is never woke him up out of bed. The patient rates his pain a 5 out of 10 in intensity. The patient reports that he's had no nausea and vomiting and no dizziness but he has had some shortness of breath. He reports that the pressure was so intense that he couldn't even burp or pass gas. The patient was concerned so he decided to come in to the hospital today for evaluation.   Past Medical History:  Diagnosis Date  . Bilateral low back pain without sciatica 03/05/2014  . Bladder wall thickening 02/26/2014   Last Assessment & Plan:  - CT with bladder wall thickening possibly related to infection v. Neoplasm. - UA and urine cytology pending. - Urology referral for cystoscopy.  . Chronic abdominal pain 08/13/2015   Overview:  Chart Review:.  03/13/15- He underwent an exploratory laparotomy with all examined abdominal contents appearing normal including the entirety of the small bowel, appendix and colon, with no evidence of internal hernia. 03/17/15- CT: Segmental wall thickening in the proximal small bowel with mild dilatation, possibly enteritis, less likely obstruction. Correlate with recent laparoscopy.Smal  . 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 Anxiety. - Will continue to follow.  . Dental infection 02/26/2014  . Dyspepsia 01/15/2014   Last Assessment & Plan:  - Chronic, stable. - Refilled Pepcid.  . Elevated creatine kinase 07/17/2014   Last Assessment & Plan:  - Trending up 216 --> 330 --> 407 --> 295. 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  Other differential includes inflammatory myopathy (dermatomyositis v. Polymyositis) v. v. Medication effect.  Unlikely hypothyroidism as normal TSH. - Denies alcohol and cocaine use. - Continue to monitor.  [name] Epidermal inclusion cyst 08/13/2015   Last Assessment & Plan:  Discussed excision of the cyst in the future. Patient is in agreement.   . Erectile dysfunction 04/11/2014  . Generalized anxiety disorder 01/15/2014   Last Assessment & Plan:  - Counseled on importance of re-starting Zoloft 50 mg once daily. - Concern that patient has been presenting to the ED multiple times for array of symptoms likely related to anxiety. - Encouraged to make an appointment at [facility] if patient feels like he needs to go to ED as we have same-day appointments available.  Patient in agreement. - Close follow-up at [facility] in one week.  [name] GERD (gastroesophageal reflux disease)   . Hemorrhoids 04/05/2014  . Hypertension   . Left inguinal hernia 03/05/2014  . Nonintractable episodic headache 07/17/2014   Last Assessment & Plan:  - Concern that patient has been presenting to the ED with 7 visits since 3/2 for similar symptoms.  Has had multiple rounds of antibiotics.  See chart review in subjective section. - Encouraged to make an appointment at [facility] if patient feels like he needs to go to ED as we have same-day appointments available.  Patient in agreement. - Recommend continuing Flonase and using   . Screening for diabetes mellitus 01/15/2014  . Tobacco use 09/16/2014  . Toe pain, right 04/05/2014  . Tonsillitis 02/12/2014     Patient Active Problem List   Diagnosis Date Noted  . Hypertension 04/26/2016  . Chronic abdominal pain 08/13/2015  . Epidermal inclusion cyst 08/13/2015  . Tobacco use 09/16/2014  . 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 Elevated creatine kinase 07/17/2014  . Nonintractable episodic headache 07/17/2014  . Erectile dysfunction 04/11/2014  . Hemorrhoids 04/05/2014  . Toe pain, right 04/05/2014  . Bilateral low back pain without sciatica 03/05/2014  . Left inguinal hernia 03/05/2014  . Bladder wall thickening 02/26/2014  . Dental infection 02/26/2014  . Tonsillitis 02/12/2014  . Dyspepsia 01/15/2014  . Generalized anxiety disorder 01/15/2014  . GERD (gastroesophageal reflux disease) 01/15/2014  . Screening for diabetes mellitus 01/15/2014    Past Surgical History:  Procedure Laterality Date  . COLONOSCOPY    . COLONOSCOPY W/ POLYPECTOMY  02/07/2015  . head surgery    . head surgery    . LAPAROSCOPY ABDOMEN DIAGNOSTIC  03/13/2015  . UPPER GASTROINTESTINAL ENDOSCOPY  04/29/2014    Prior to Admission medications   Medication Sig Start Date End Date Taking? Authorizing Provider  albuterol (PROVENTIL HFA;VENTOLIN HFA) 108 (90 Base) MCG/ACT inhaler Inhale 2 puffs into the lungs every 4 (four) hours as needed for wheezing or shortness of breath. 08/06/15   Irean HongSung, Jade J, MD  albuterol (PROVENTIL HFA;VENTOLIN HFA) 108 (90 Base) MCG/ACT inhaler Inhale 2 puffs into the lungs every 6 (six) hours as needed for wheezing or shortness of breath. 04/28/16   Rebecka ApleyWebster, Icholas Irby P, MD  amLODipine (NORVASC) 10 MG tablet Take 10 mg by mouth daily.    [provider]  benzonatate (TESSALON PERLES) 100 MG capsule Take 1 capsule (100 mg total) by mouth 3 (three) times daily as needed for cough (Take 1-2 per dose). 02/23/16   Menshew, Charlesetta IvoryJenise V Bacon, PA-C  benzonatate (TESSALON PERLES) 100 MG capsule Take 1 capsule (100 mg total) by mouth every 6 (six) hours as needed for cough. 04/28/16   Rebecka ApleyWebster,  Aurorah Schlachter P, MD  butalbital-acetaminophen-caffeine (FIORICET, [facility]) [phone] MG tablet Take 1-2 tablets by mouth every 6 (six) hours as needed for headache. 02/25/16 02/24/17  Rebecka ApleyWebster, Daylin Eads P, MD  chlorpheniramine-HYDROcodone [facility](TUSSIONEX PENNKINETIC ER) 10-8 MG/5ML SUER Take 5 mLs by mouth 2 (two) times daily. 08/06/15   Irean HongSung, Jade J, MD  cyclobenzaprine (FLEXERIL) 10 MG tablet Take by mouth. 09/04/14   [provider]  cyclobenzaprine (FLEXERIL) 10 MG tablet Take 1 tablet (10 mg total) by mouth 3 (three) times daily as needed. 06/12/16   Darci CurrentBrown, Ruso N, MD  diazepam (VALIUM) 5 MG tablet Take 1 tablet (5 mg total) by mouth every 8 (eight) hours as needed for anxiety. 06/11/16   Irean HongSung, Jade J, MD  dicyclomine (BENTYL) 10 MG capsule Take 1 capsule (10 mg total) by mouth 4 (four) times daily -  before meals and at bedtime. 05/18/16   Wyline MoodAnna, Kiran, MD  fluticasone [facility](FLONASE) 50 MCG/ACT nasal spray Place 2 sprays into both nostrils daily. 02/23/16   Menshew, Charlesetta IvoryJenise V Bacon, PA-C  hydrochlorothiazide (HYDRODIURIL) 25 MG tablet Take 25 mg by mouth daily.  [provider]  lidocaine (LIDODERM) 5 % Place 1 patch onto the skin daily. Remove & Discard patch within 12 hours or as directed by MD 06/11/16   Irean Hong, MD  omeprazole (PRILOSEC) 40 MG capsule Take 1 capsule (40 mg total) by mouth daily. 02/06/16 02/05/17  Darci Current, MD  pantoprazole (PROTONIX) 40 MG tablet Take 1 tablet (40 mg total) by mouth daily. 03/20/16 03/20/17  Darci Current, MD  predniSONE (DELTASONE) 20 MG tablet 3 tablets daily 4 days 08/06/15   Irean Hong, MD  predniSONE (DELTASONE) 20 MG tablet Take 3 tablets (60 mg total) by mouth daily. 04/28/16   Rebecka Apley, MD  sertraline (ZOLOFT) 50 MG tablet Take 50 mg by mouth. 09/22/15   [provider]  sildenafil (REVATIO) 20 MG tablet Take by mouth. 04/11/14   [provider]  sucralfate (CARAFATE) 1 g tablet Take 1 tablet (1 g total) by mouth 2  (two) times daily. 02/25/16   Rebecka Apley, MD  sucralfate (CARAFATE) 1 g tablet Take 1 tablet (1 g total) by mouth 2 (two) times daily. 07/10/16   Rebecka Apley, MD  VIRTUSSIN A/C 100-10 MG/5ML syrup  05/05/16   [provider]    Allergies Other  Family History  Problem Relation Age of Onset  . Diabetes Mother   . Multiple sclerosis Brother   . Breast cancer Maternal Aunt   . Lung cancer Maternal Grandfather   . Stomach cancer Paternal Grandmother   . Colon cancer Maternal Uncle     Social History Social History  Substance Use Topics  . Smoking status: Current Every Day Smoker    Packs/day: 0.25    Years: 10.00    Types: Cigarettes  . Smokeless tobacco: Never Used  . Alcohol use Yes    Review of Systems  Constitutional: No fever/chills Eyes: No visual changes. ENT: No sore throat. Cardiovascular: Denies chest pain. Respiratory: shortness of breath. Gastrointestinal:  abdominal pain.  No nausea, no vomiting.  No diarrhea.  No constipation. Genitourinary: Negative for dysuria. Musculoskeletal: Negative for back pain. Skin: Negative for rash. Neurological: Negative for headaches, focal weakness or numbness.   ____________________________________________   PHYSICAL EXAM:  VITAL SIGNS: ED Triage Vitals  Enc Vitals Group     BP 07/10/16 0445 124/82     Pulse Rate 07/10/16 0445 93     Resp 07/10/16 0445 18     Temp 07/10/16 0445 97.6 F (36.4 C)     Temp Source 07/10/16 0445 Oral     SpO2 07/10/16 0445 96 %     Weight 07/10/16 0446 215 lb (97.5 kg)     Height 07/10/16 0446 5\' 9"  (1.753 m)     Head Circumference --      Peak Flow --      Pain Score 07/10/16 0443 8     Pain Loc --      Pain Edu? --      Excl. in GC? --     Constitutional: Alert and oriented. Well appearing and inMild distress. Eyes: Conjunctivae are normal. PERRL. EOMI. Head: Atraumatic. Nose: No congestion/rhinnorhea. Mouth/Throat: Mucous membranes are moist.   Oropharynx non-erythematous. Cardiovascular: Normal rate, regular rhythm. Grossly normal heart sounds.  Good peripheral circulation. Respiratory: Normal respiratory effort.  No retractions. Lungs CTAB. Gastrointestinal: Soft with some mild epigastric tenderness to palpation. No distention. Positive bowel sounds Musculoskeletal: No lower extremity tenderness nor edema.   Neurologic:  Normal speech and language.  Skin:  Skin is warm, dry and intact.  Psychiatric: Mood and affect are normal.   ____________________________________________   LABS (all labs ordered are listed, but only abnormal results are displayed)  Labs Reviewed  COMPREHENSIVE METABOLIC PANEL - Abnormal; Notable for the following:       Result Value   Glucose, Bld 114 (*)    Calcium 8.4 (*)    Alkaline Phosphatase 35 (*)    All other components within normal limits  CBC  LIPASE, BLOOD  TROPONIN I  TROPONIN I   ____________________________________________  EKG  ED ECG REPORT I, Rebecka Apley, the attending physician, personally viewed and interpreted this ECG.   Date: 07/10/2016  EKG Time: 710  Rate: 79  Rhythm: normal sinus rhythm  Axis: normal  Intervals:none  ST&T Change: Flipped T waves in leads II, III, aVF and V5 and V6 with some T-wave flattening seen on previous EKGs.04/2016  ____________________________________________  RADIOLOGY  CXR ____________________________________________   PROCEDURES  Procedure(s) performed: None  Procedures  Critical Care performed: No  ____________________________________________   INITIAL IMPRESSION / ASSESSMENT AND PLAN / ED COURSE  Pertinent labs & imaging results that were available during my care of the patient were reviewed by me and considered in my medical decision making (see chart for details).  This is a 56 year old male who comes into the hospital today with epigastric pain. The patient does have a history of reflux but given his  epigastric pain and his age and comorbidities I will check some blood work including a chest x-ray, EKG and a troponin. I will reassess the patient.     The patient is currently awaiting a chest x-ray. He will also have a repeat troponin given his location of discomfort. The patient will be reevaluated by Dr. Shaune Pollack who will follow-up the results of the x-ray as well as the troponin. ____________________________________________   FINAL CLINICAL IMPRESSION(S) / ED DIAGNOSES  Final diagnoses:  Epigastric pain      NEW MEDICATIONS STARTED DURING THIS VISIT:  New Prescriptions   SUCRALFATE (CARAFATE) 1 G TABLET    Take 1 tablet (1 g total) by mouth 2 (two) times daily.     Note:  This document was prepared using Dragon voice recognition software and may include unintentional dictation errors.    Rebecka Apley, MD 07/10/16 [phone]

## 2016-07-10 NOTE — ED Triage Notes (Signed)
Pt presents ambulatory to triage with c/o acid reflux. Pt reports that he has had this stomach pain x 17 years. Pt reports difficulty since around 0200 this am. Pt also reports that he is unable to pass gas right now but has had regular BM. Pt reports drinking a 40 oz beer around 2300 Friday night. Pt is in NAD at this time.

## 2016-07-10 NOTE — Discharge Instructions (Addendum)
Although no certain cause was found, your examination are reassuring in the emergency department today. Return immediately for any new or worsening abdominal pain, black or bloody stool, chest pain, trouble breathing, sweats, dizziness, passing out, or any other symptoms concerning to you.

## 2016-07-10 NOTE — ED Provider Notes (Signed)
[facility]  I accepted care from [facility] ____________________________________________    LABS (pertinent positives/negatives)  Labs Reviewed  COMPREHENSIVE METABOLIC PANEL - Abnormal; Notable for the following:       Result Value   Glucose, Bld 114 (*)    Calcium 8.4 (*)    Alkaline Phosphatase 35 (*)    All other components within normal limits  CBC  LIPASE, BLOOD  TROPONIN I  TROPONIN I     _________   INITIAL IMPRESSION / ASSESSMENT AND PLAN / ED COURSE   Pertinent labs & imaging results that were available during my care of the patient were reviewed by me and considered in my medical decision making (see chart for details).  Patient with symptoms most likely due to indigestion/gastritis, but awaiting repeat troponin although felt to be unlikely per signout.  I reviewed patient's symptoms around 11:30, he states he feels much better. We discussed follow-up with a primary care doctor as well as cardiologist.    CONSULTATIONS: None   Patient / Family / Caregiver informed of clinical course, medical decision-making process, and agree with plan.  I discussed return precautions, follow-up instructions, and discharged instructions with patient and/or family.  Discharge Instructions: Although no certain cause was found, your examination are reassuring in the emergency department today. Return immediately for any new or worsening abdominal pain, black or bloody stool, chest pain, trouble breathing, sweats, dizziness, passing out, or any other symptoms concerning to you.   ____________________________________________   FINAL CLINICAL IMPRESSION(S) / ED DIAGNOSES  Final diagnoses:  Epigastric pain        Governor RooksLord, Constancia Geeting, MD 07/10/16 [phone]

## 2016-07-10 NOTE — ED Notes (Signed)
RN to room to assess patient. PT sleeping when RN entered the room. Pt fell asleep repeatedly during assessment.   Patient c/o acid reflux and epigastric pain rated at 8 out of 10.

## 2016-08-19 ENCOUNTER — Emergency Department: Payer: BLUE CROSS/BLUE SHIELD

## 2016-08-19 DIAGNOSIS — Z79899 Other long term (current) drug therapy: Secondary | ICD-10-CM | POA: Diagnosis not present

## 2016-08-19 DIAGNOSIS — I10 Essential (primary) hypertension: Secondary | ICD-10-CM | POA: Insufficient documentation

## 2016-08-19 DIAGNOSIS — J209 Acute bronchitis, unspecified: Secondary | ICD-10-CM | POA: Insufficient documentation

## 2016-08-19 DIAGNOSIS — F17210 Nicotine dependence, cigarettes, uncomplicated: Secondary | ICD-10-CM | POA: Diagnosis not present

## 2016-08-19 DIAGNOSIS — R05 Cough: Secondary | ICD-10-CM | POA: Diagnosis present

## 2016-08-19 LAB — CBC
HEMATOCRIT: 45.5 % (ref 40.0–52.0)
Hemoglobin: 15.3 g/dL (ref 13.0–18.0)
MCH: 30.3 pg (ref 26.0–34.0)
MCHC: 33.6 g/dL (ref 32.0–36.0)
MCV: 90.2 fL (ref 80.0–100.0)
PLATELETS: 255 10*3/uL (ref 150–440)
RBC: 5.04 MIL/uL (ref 4.40–5.90)
RDW: 13.8 % (ref 11.5–14.5)
WBC: 5.8 10*3/uL (ref 3.8–10.6)

## 2016-08-19 MED ORDER — ALBUTEROL SULFATE (2.5 MG/3ML) 0.083% IN NEBU
5.0000 mg | INHALATION_SOLUTION | Freq: Once | RESPIRATORY_TRACT | Status: AC
  Administered 2016-08-19: 5 mg via RESPIRATORY_TRACT
  Filled 2016-08-19: qty 6

## 2016-08-19 NOTE — ED Triage Notes (Signed)
Pt states that he has had cough and congestion for approximately 5 days with no relief with OTC medication.  Pt states that his chest is sore from coughing as well.  Pt states that his also having sinus drainage and ear pain as well.  Pt is A&Ox4, ambulatory to triage.

## 2016-08-20 ENCOUNTER — Emergency Department
Admission: EM | Admit: 2016-08-20 | Discharge: 2016-08-20 | Disposition: A | Discharge status: Home / Self Care | Payer: BLUE CROSS/BLUE SHIELD | Attending: Emergency Medicine | Admitting: Emergency Medicine

## 2016-08-20 DIAGNOSIS — J209 Acute bronchitis, unspecified: Secondary | ICD-10-CM

## 2016-08-20 LAB — BASIC METABOLIC PANEL
ANION GAP: 6 (ref 5–15)
BUN: 15 mg/dL (ref 6–20)
CO2: 28 mmol/L (ref 22–32)
Calcium: 8.7 mg/dL — ABNORMAL LOW (ref 8.9–10.3)
Chloride: 106 mmol/L (ref 101–111)
Creatinine, Ser: 1.11 mg/dL (ref 0.61–1.24)
GLUCOSE: 152 mg/dL — AB (ref 65–99)
POTASSIUM: 3.1 mmol/L — AB (ref 3.5–5.1)
SODIUM: 140 mmol/L (ref 135–145)

## 2016-08-20 LAB — TROPONIN I

## 2016-08-20 MED ORDER — AZITHROMYCIN 250 MG PO TABS: ORAL_TABLET | ORAL | 0 refills | Status: AC

## 2016-08-20 MED ORDER — ALBUTEROL SULFATE (2.5 MG/3ML) 0.083% IN NEBU
INHALATION_SOLUTION | RESPIRATORY_TRACT | Status: AC
  Filled 2016-08-20: qty 3

## 2016-08-20 MED ORDER — BENZONATATE 100 MG PO CAPS: 100.0000 mg | ORAL_CAPSULE | Freq: Three times a day (TID) | ORAL | 0 refills | Status: DC | PRN

## 2016-08-20 MED ORDER — BENZONATATE 100 MG PO CAPS
ORAL_CAPSULE | ORAL | Status: AC
  Filled 2016-08-20: qty 1

## 2016-08-20 MED ORDER — ALBUTEROL SULFATE HFA 108 (90 BASE) MCG/ACT IN AERS: 2.0000 | INHALATION_SPRAY | Freq: Four times a day (QID) | RESPIRATORY_TRACT | 0 refills | Status: DC | PRN

## 2016-08-20 MED ORDER — BENZONATATE 100 MG PO CAPS
100.0000 mg | ORAL_CAPSULE | Freq: Once | ORAL | Status: AC
  Administered 2016-08-20: 100 mg via ORAL

## 2016-08-20 MED ORDER — AZITHROMYCIN 500 MG PO TABS
500.0000 mg | ORAL_TABLET | Freq: Once | ORAL | Status: AC
  Administered 2016-08-20: 500 mg via ORAL

## 2016-08-20 MED ORDER — ALBUTEROL SULFATE (2.5 MG/3ML) 0.083% IN NEBU
2.5000 mg | INHALATION_SOLUTION | Freq: Once | RESPIRATORY_TRACT | Status: AC
  Administered 2016-08-20: 2.5 mg via RESPIRATORY_TRACT

## 2016-08-20 MED ORDER — AZITHROMYCIN 500 MG PO TABS
ORAL_TABLET | ORAL | Status: AC
  Filled 2016-08-20: qty 1

## 2016-08-20 NOTE — ED Notes (Signed)

## 2016-08-20 NOTE — ED Notes (Addendum)
Pt presents ED 08 c/o cough, congestion, ear ache, and sinus pain x1 week; pt states "I've tried all the over the counter stuff, and it hasn't worked"; pt denies any fever, chills, nausea, vomiting, diarrhea, light headedness, dizziness or chest pain. Pt states the albuterol treatment seems to have helped a great deal. Pt is awake, alert and oriented x4.

## 2016-08-20 NOTE — ED Provider Notes (Signed)
[facility] Emergency Department Provider Note    First MD Initiated Contact with Patient 08/20/16 [phone]     (approximate)  I have reviewed the triage vital signs and the nursing notes.   HISTORY  Chief Complaint Cough and Chest Pain    HPI Douglas Mcdaniel is a 56 y.o. male presents to the emergency department with five-day history of productive cough with yellow sputum and chest soreness secondary to cough. "Sinus drainage". Patient denies any fever afebrile on presentation. Patient does admit to daily cigarette use approximately half pack to pack.   Past Medical History:  Diagnosis Date  . Bilateral low back pain without sciatica 03/05/2014  . Bladder wall thickening 02/26/2014   Last Assessment & Plan:  - CT with bladder wall thickening possibly related to infection v. Neoplasm. - UA and urine cytology pending. - Urology referral for cystoscopy.  . Chronic abdominal pain 08/13/2015   Overview:  Chart Review:.  03/13/15- He underwent an exploratory laparotomy with all examined abdominal contents appearing normal including the entirety of the small bowel, appendix and colon, with no evidence of internal hernia. 03/17/15- CT: Segmental wall thickening in the proximal small bowel with mild dilatation, possibly enteritis, less likely obstruction. Correlate with recent laparoscopy.Smal  . 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 Anxiety. - Will continue to follow.  . Dental infection 02/26/2014  . Dyspepsia 01/15/2014   Last Assessment & Plan:  - Chronic, stable. - Refilled Pepcid.  . Elevated creatine kinase 07/17/2014   Last Assessment & Plan:  - Trending up 216 --> 330 --> 407 --> 295. 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  Other differential includes inflammatory myopathy (dermatomyositis v. Polymyositis) v. v. Medication effect.  Unlikely hypothyroidism as normal TSH. -  Denies alcohol and cocaine use. - Continue to monitor.  [name] Epidermal inclusion cyst 08/13/2015   Last Assessment & Plan:  Discussed excision of the cyst in the future. Patient is in agreement.   . Erectile dysfunction 04/11/2014  . Generalized anxiety disorder 01/15/2014   Last Assessment & Plan:  - Counseled on importance of re-starting Zoloft 50 mg once daily. - Concern that patient has been presenting to the ED multiple times for array of symptoms likely related to anxiety. - Encouraged to make an appointment at [facility] if patient feels like he needs to go to ED as we have same-day appointments available.  Patient in agreement. - Close follow-up at [facility] in one week.  [name] GERD (gastroesophageal reflux disease)   . Hemorrhoids 04/05/2014  . Hypertension   . Left inguinal hernia 03/05/2014  . Nonintractable episodic headache 07/17/2014   Last Assessment & Plan:  - Concern that patient has been presenting to the ED with 7 visits since 3/2 for similar symptoms.  Has had multiple rounds of antibiotics.  See chart review in subjective section. - Encouraged to make an appointment at [facility] if patient feels like he needs to go to ED as we have same-day appointments available.  Patient in agreement. - Recommend continuing Flonase and using   . Screening for diabetes mellitus 01/15/2014  . Tobacco use 09/16/2014  . Toe pain, right 04/05/2014  . Tonsillitis 02/12/2014    Patient Active Problem List   Diagnosis Date Noted  . Hypertension 04/26/2016  . Chronic abdominal pain 08/13/2015  . Epidermal inclusion cyst 08/13/2015  . Tobacco use 09/16/2014  . Delayed ejaculation  08/23/2014  . Elevated creatine kinase 07/17/2014  . Nonintractable episodic headache 07/17/2014  . Erectile dysfunction 04/11/2014  . Hemorrhoids 04/05/2014  . Toe pain, right 04/05/2014  . Bilateral low back pain without sciatica 03/05/2014  . Left inguinal hernia 03/05/2014  . Bladder wall thickening 02/26/2014  . Dental infection 02/26/2014    . Tonsillitis 02/12/2014  . Dyspepsia 01/15/2014  . Generalized anxiety disorder 01/15/2014  . GERD (gastroesophageal reflux disease) 01/15/2014  . Screening for diabetes mellitus 01/15/2014    Past Surgical History:  Procedure Laterality Date  . COLONOSCOPY    . COLONOSCOPY W/ POLYPECTOMY  02/07/2015  . head surgery    . head surgery    . LAPAROSCOPY ABDOMEN DIAGNOSTIC  03/13/2015  . UPPER GASTROINTESTINAL ENDOSCOPY  04/29/2014    Prior to Admission medications   Medication Sig Start Date End Date Taking? Authorizing Provider  albuterol (PROVENTIL HFA;VENTOLIN HFA) 108 (90 Base) MCG/ACT inhaler Inhale 2 puffs into the lungs every 4 (four) hours as needed for wheezing or shortness of breath. 08/06/15   Irean Hong, MD  albuterol (PROVENTIL HFA;VENTOLIN HFA) 108 (90 Base) MCG/ACT inhaler Inhale 2 puffs into the lungs every 6 (six) hours as needed for wheezing or shortness of breath. 04/28/16   Rebecka Apley, MD  amLODipine (NORVASC) 10 MG tablet Take 10 mg by mouth daily.    [provider]  benzonatate (TESSALON PERLES) 100 MG capsule Take 1 capsule (100 mg total) by mouth 3 (three) times daily as needed for cough (Take 1-2 per dose). 02/23/16   Menshew, Charlesetta Ivory, PA-C  benzonatate (TESSALON PERLES) 100 MG capsule Take 1 capsule (100 mg total) by mouth every 6 (six) hours as needed for cough. 04/28/16   Rebecka Apley, MD  butalbital-acetaminophen-caffeine (FIORICET, [facility]) [phone] MG tablet Take 1-2 tablets by mouth every 6 (six) hours as needed for headache. 02/25/16 02/24/17  Rebecka Apley, MD  chlorpheniramine-HYDROcodone [facility] PENNKINETIC ER) 10-8 MG/5ML SUER Take 5 mLs by mouth 2 (two) times daily. 08/06/15   Irean Hong, MD  cyclobenzaprine (FLEXERIL) 10 MG tablet Take by mouth. 09/04/14   [provider]  cyclobenzaprine (FLEXERIL) 10 MG tablet Take 1 tablet (10 mg total) by mouth 3 (three) times daily as needed. 06/12/16   Darci Current, MD  diazepam (VALIUM) 5 MG tablet Take 1 tablet (5 mg total) by mouth every 8 (eight) hours as needed for anxiety. 06/11/16   Irean Hong, MD  dicyclomine (BENTYL) 10 MG capsule Take 1 capsule (10 mg total) by mouth 4 (four) times daily -  before meals and at bedtime. 05/18/16   Wyline Mood, MD  fluticasone [facility]) 50 MCG/ACT nasal spray Place 2 sprays into both nostrils daily. 02/23/16   Menshew, Charlesetta Ivory, PA-C  hydrochlorothiazide (HYDRODIURIL) 25 MG tablet Take 25 mg by mouth daily.    [provider]  lidocaine (LIDODERM) 5 % Place 1 patch onto the skin daily. Remove & Discard patch within 12 hours or as directed by MD 06/11/16   Irean Hong, MD  omeprazole (PRILOSEC) 40 MG capsule Take 1 capsule (40 mg total) by mouth daily. 02/06/16 02/05/17  Darci Current, MD  pantoprazole (PROTONIX) 40 MG tablet Take 1 tablet (40 mg total) by mouth daily. 03/20/16 03/20/17  Darci Current, MD  predniSONE (DELTASONE) 20 MG tablet 3 tablets daily 4 days 08/06/15   Irean Hong, MD  predniSONE (DELTASONE) 20 MG tablet Take 3 tablets (60  mg total) by mouth daily. 04/28/16   Rebecka Apley, MD  sertraline (ZOLOFT) 50 MG tablet Take 50 mg by mouth. 09/22/15   [provider]  sildenafil (REVATIO) 20 MG tablet Take by mouth. 04/11/14   [provider]  sucralfate (CARAFATE) 1 g tablet Take 1 tablet (1 g total) by mouth 2 (two) times daily. 02/25/16   Rebecka Apley, MD  sucralfate (CARAFATE) 1 g tablet Take 1 tablet (1 g total) by mouth 2 (two) times daily. 07/10/16   Rebecka Apley, MD  VIRTUSSIN A/C 100-10 MG/5ML syrup  05/05/16   [provider]    Allergies Other  Family History  Problem Relation Age of Onset  . Diabetes Mother   . Multiple sclerosis Brother   . Breast cancer Maternal Aunt   . Lung cancer Maternal Grandfather   . Stomach cancer Paternal Grandmother   . Colon cancer Maternal Uncle     Social History Social History  Substance Use  Topics  . Smoking status: Current Every Day Smoker    Packs/day: 0.25    Years: 10.00    Types: Cigarettes  . Smokeless tobacco: Never Used  . Alcohol use Yes    Review of Systems Constitutional: No fever/chills Eyes: No visual changes. ENT: No sore throat. Cardiovascular: Positive for "chest soreness" Respiratory: Denies shortness of breath.Positive for productive cough Gastrointestinal: No abdominal pain.  No nausea, no vomiting.  No diarrhea.  No constipation. Genitourinary: Negative for dysuria. Musculoskeletal: Negative for neck pain.  Negative for back pain. Integumentary: Negative for rash. Neurological: Negative for headaches, focal weakness or numbness.   ____________________________________________   PHYSICAL EXAM:  VITAL SIGNS: ED Triage Vitals  Enc Vitals Group     BP 08/19/16 2322 126/77     Pulse Rate 08/19/16 2322 82     Resp 08/19/16 2322 18     Temp 08/19/16 2322 98.5 F (36.9 C)     Temp Source 08/19/16 2322 Oral     SpO2 08/19/16 2322 97 %     Weight 08/19/16 2322 96.2 kg (212 lb)     Height 08/19/16 2322 1.753 m (5\' 9" )     Head Circumference --      Peak Flow --      Pain Score 08/19/16 2321 9     Pain Loc --      Pain Edu? --      Excl. in GC? --     Constitutional: Alert and oriented. Well appearing and in no acute distress. Eyes: Conjunctivae are normal.  Head: Atraumatic. Mouth/Throat: Mucous membranes are moist. Oropharynx non-erythematous. Neck: No stridor.   Cardiovascular: Normal rate, regular rhythm. Good peripheral circulation. Grossly normal heart sounds. Respiratory: Normal respiratory effort.  No retractions. Lungs CTAB. Gastrointestinal: Soft and nontender. No distention.  Musculoskeletal: No lower extremity tenderness nor edema. No gross deformities of extremities. Neurologic:  Normal speech and language. No gross focal neurologic deficits are appreciated.  Skin:  Skin is warm, dry and intact. No rash noted. Psychiatric:  Mood and affect are normal. Speech and behavior are normal.  ____________________________________________   LABS (all labs ordered are listed, but only abnormal results are displayed)  Labs Reviewed  BASIC METABOLIC PANEL - Abnormal; Notable for the following:       Result Value   Potassium 3.1 (*)    Glucose, Bld 152 (*)    Calcium 8.7 (*)    All other components within normal limits  CBC  TROPONIN I  ____________________________________________  EKG  ED ECG REPORT I, [place] N Jacquelyn Antony, the attending physician, personally viewed and interpreted this ECG.   Date: 08/20/2016  EKG Time: 11:29 PM  Rate: 79  Rhythm: Normal sinus rhythm  Axis: Normal  Intervals: Normal  ST&T Change: None  ____________________________________________  RADIOLOGY I, [place] N Oral Remache, personally viewed and evaluated these images (plain radiographs) as part of my medical decision making, as well as reviewing the written report by the radiologist.  Dg Chest 2 View  Result Date: 08/20/2016 CLINICAL DATA:  Acute onset of cough and congestion. Chest soreness. Initial encounter. EXAM: CHEST  2 VIEW COMPARISON:  Chest radiograph performed 07/10/2016 FINDINGS: There is mild elevation of the left hemidiaphragm. Peribronchial thickening is noted. No pleural effusion or pneumothorax is seen. The heart is normal in size. No acute osseous abnormalities are identified. IMPRESSION: Mild elevation of the left hemidiaphragm. Peribronchial thickening noted. Lungs otherwise grossly clear. Electronically Signed   By: Roanna RaiderJeffery  Chang M.D.   On: 08/20/2016 00:37    ________________ Procedures   ____________________________________________   INITIAL IMPRESSION / ASSESSMENT AND PLAN / ED COURSE  Pertinent labs & imaging results that were available during my care of the patient were reviewed by me and considered in my medical decision making (see chart for details).  Patient given DuoNeb in the emergency  department azithromycin and Tessalon will be prescribed the same for home.    ____________________________________________  FINAL CLINICAL IMPRESSION(S) / ED DIAGNOSES  Final diagnoses:  Acute bronchitis, unspecified organism     MEDICATIONS GIVEN DURING THIS VISIT:  Medications  albuterol (PROVENTIL) (2.5 MG/3ML) 0.083% nebulizer solution 5 mg (5 mg Nebulization Given 08/19/16 2331)  albuterol (PROVENTIL) (2.5 MG/3ML) 0.083% nebulizer solution 2.5 mg (2.5 mg Nebulization Given 08/20/16 0252)  benzonatate (TESSALON) capsule 100 mg (100 mg Oral Given 08/20/16 0252)  azithromycin (ZITHROMAX) tablet 500 mg (500 mg Oral Given 08/20/16 0252)     NEW OUTPATIENT MEDICATIONS STARTED DURING THIS VISIT:  New Prescriptions   No medications on file    Modified Medications   No medications on file    Discontinued Medications   No medications on file     Note:  This document was prepared using Dragon voice recognition software and may include unintentional dictation errors.    Darci CurrentBrown,  N, MD 08/20/16 [phone]

## 2016-09-08 ENCOUNTER — Emergency Department
Admission: EM | Admit: 2016-09-08 | Discharge: 2016-09-08 | Disposition: A | Discharge status: Home / Self Care | Payer: BLUE CROSS/BLUE SHIELD | Attending: Emergency Medicine | Admitting: Emergency Medicine

## 2016-09-08 ENCOUNTER — Encounter: Payer: Self-pay | Admitting: *Deleted

## 2016-09-08 ENCOUNTER — Emergency Department: Payer: BLUE CROSS/BLUE SHIELD

## 2016-09-08 DIAGNOSIS — R0789 Other chest pain: Secondary | ICD-10-CM | POA: Diagnosis not present

## 2016-09-08 DIAGNOSIS — F17210 Nicotine dependence, cigarettes, uncomplicated: Secondary | ICD-10-CM | POA: Insufficient documentation

## 2016-09-08 DIAGNOSIS — I10 Essential (primary) hypertension: Secondary | ICD-10-CM | POA: Insufficient documentation

## 2016-09-08 DIAGNOSIS — Z79899 Other long term (current) drug therapy: Secondary | ICD-10-CM | POA: Diagnosis not present

## 2016-09-08 LAB — CBC
HCT: 42.4 % (ref 40.0–52.0)
HEMOGLOBIN: 14.7 g/dL (ref 13.0–18.0)
MCH: 30.7 pg (ref 26.0–34.0)
MCHC: 34.5 g/dL (ref 32.0–36.0)
MCV: 88.9 fL (ref 80.0–100.0)
PLATELETS: 224 10*3/uL (ref 150–440)
RBC: 4.77 MIL/uL (ref 4.40–5.90)
RDW: 14.1 % (ref 11.5–14.5)
WBC: 5.3 10*3/uL (ref 3.8–10.6)

## 2016-09-08 LAB — BASIC METABOLIC PANEL
ANION GAP: 6 (ref 5–15)
BUN: 12 mg/dL (ref 6–20)
CHLORIDE: 107 mmol/L (ref 101–111)
CO2: 26 mmol/L (ref 22–32)
CREATININE: 0.96 mg/dL (ref 0.61–1.24)
Calcium: 8.4 mg/dL — ABNORMAL LOW (ref 8.9–10.3)
GFR calc non Af Amer: >60 mL/min (ref >60)
Glucose, Bld: 86 mg/dL (ref 65–99)
POTASSIUM: 3.3 mmol/L — AB (ref 3.5–5.1)
Sodium: 139 mmol/L (ref 135–145)

## 2016-09-08 LAB — TROPONIN I: Troponin I: <0.03 ng/mL (ref <0.03)

## 2016-09-08 MED ORDER — OMEPRAZOLE 40 MG PO CPDR: 40.0000 mg | DELAYED_RELEASE_CAPSULE | Freq: Every day | ORAL | 0 refills | Status: DC

## 2016-09-08 MED ORDER — HYDROCHLOROTHIAZIDE 25 MG PO TABS: 25.0000 mg | ORAL_TABLET | Freq: Every day | ORAL | 0 refills | Status: AC

## 2016-09-08 MED ORDER — AMLODIPINE BESYLATE 10 MG PO TABS: 10.0000 mg | ORAL_TABLET | Freq: Every day | ORAL | 0 refills | Status: AC

## 2016-09-08 NOTE — ED Notes (Signed)
See triage note..the patient reports to ED w/ c/o CP and h/a. Pt sts that [place] [facility] [place], sts that CP same. Pt also sts that he hasn't eaten much today and that his GERD is irritating him.  Pt sts that he has been out of HTN, "fluid" pill and "reflux pill" x 3 days. Pt sts that BP was elevated today. Pt sts that he was diaphoretic after riding to friend's house on motorcycle. Resp even and unlabored. NAD.

## 2016-09-08 NOTE — ED Triage Notes (Signed)
Pt to ED from home reporting headache and centralized chest pain for the past week. Pt reports he has been out of BP medication for the past two days as well as gerd medication. PT reports [facility] and was told he was having muscle pains. Pt reports tenderness upon palpation of chest. No SOB or cough reported. No light sensitivity, nausea, or vomiting. PT reports having been diaphoretic and weak today but also reports he has not eaten today and has been outside all day.

## 2016-09-08 NOTE — ED Provider Notes (Signed)
[facility] ([facility]) Emergency Department Provider Note  ____________________________________________   I have reviewed the triage vital signs and the nursing notes.   HISTORY  Chief Complaint Chest Pain and Headache    HPI JETTIE LAZARE is a 56 y.o. male who is very well known to this and multiple other emergency rooms from his 20 prior visits thus far this year, presents today with similar complaints that he had last night in a different ER, which is that he has pain in his chest wall which is worse when he touches it or raises his arm. No pleuritic pain or shortness of breath, it hurts when he is lifting up stuff to put on his motorcycle. He has no exertional symptoms unless he is actually using that muscle. He denies any leg swelling, he has no personal or family history of PE or DVT, no recent surgery or travel. He also wants me to refill his blood pressure medications. He states when he was on his motorcycle 100 he got sweaty. He also has a history of reflux disease and he is out of his reflux pills. He denies any chest pain or abdominal pain but sometimes "when I know that acid is there I don't want to eat". He denies any focal right upper or lower quadrant abdominal pain nausea vomiting diarrhea melena bright red blood per rectum or any other complaint associated with the gastrointestinal system.    Past Medical History:  Diagnosis Date  . Bilateral low back pain without sciatica 03/05/2014  . Bladder wall thickening 02/26/2014   Last Assessment & Plan:  - CT with bladder wall thickening possibly related to infection v. Neoplasm. - UA and urine cytology pending. - Urology referral for cystoscopy.  . Chronic abdominal pain 08/13/2015   Overview:  Chart Review:.  03/13/15- He underwent an exploratory laparotomy with all examined abdominal contents appearing normal including the entirety of the small bowel, appendix and colon, with no evidence of internal hernia. 03/17/15-  CT: Segmental wall thickening in the proximal small bowel with mild dilatation, possibly enteritis, less likely obstruction. Correlate with recent laparoscopy.Smal  . 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 Anxiety. - Will continue to follow.  . Dental infection 02/26/2014  . Dyspepsia 01/15/2014   Last Assessment & Plan:  - Chronic, stable. - Refilled Pepcid.  . Elevated creatine kinase 07/17/2014   Last Assessment & Plan:  - Trending up 216 --> 330 --> 407 --> 295. 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  Other differential includes inflammatory myopathy (dermatomyositis v. Polymyositis) v. v. Medication effect.  Unlikely hypothyroidism as normal TSH. - Denies alcohol and cocaine use. - Continue to monitor.  [name] Epidermal inclusion cyst 08/13/2015   Last Assessment & Plan:  Discussed excision of the cyst in the future. Patient is in agreement.   . Erectile dysfunction 04/11/2014  . Generalized anxiety disorder 01/15/2014   Last Assessment & Plan:  - Counseled on importance of re-starting Zoloft 50 mg once daily. - Concern that patient has been presenting to the ED multiple times for array of symptoms likely related to anxiety. - Encouraged to make an appointment at [facility] if patient feels like he needs to go to ED as we have same-day appointments available.  Patient in agreement. - Close follow-up at [facility] in one week.  [name] GERD (gastroesophageal reflux disease)   . Hemorrhoids 04/05/2014  . Hypertension   .  Left inguinal hernia 03/05/2014  . Nonintractable episodic headache 07/17/2014   Last Assessment & Plan:  - Concern that patient has been presenting to the ED with 7 visits since 3/2 for similar symptoms.  Has had multiple rounds of antibiotics.  See chart review in subjective section. - Encouraged to make an appointment at [facility] if patient feels like he needs to go to ED as we have same-day appointments available.   Patient in agreement. - Recommend continuing Flonase and using   . Screening for diabetes mellitus 01/15/2014  . Tobacco use 09/16/2014  . Toe pain, right 04/05/2014  . Tonsillitis 02/12/2014    Patient Active Problem List   Diagnosis Date Noted  . Hypertension 04/26/2016  . Chronic abdominal pain 08/13/2015  . Epidermal inclusion cyst 08/13/2015  . Tobacco use 09/16/2014  . 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 Elevated creatine kinase 07/17/2014  . Nonintractable episodic headache 07/17/2014  . Erectile dysfunction 04/11/2014  . Hemorrhoids 04/05/2014  . Toe pain, right 04/05/2014  . Bilateral low back pain without sciatica 03/05/2014  . Left inguinal hernia 03/05/2014  . Bladder wall thickening 02/26/2014  . Dental infection 02/26/2014  . Tonsillitis 02/12/2014  . Dyspepsia 01/15/2014  . Generalized anxiety disorder 01/15/2014  . GERD (gastroesophageal reflux disease) 01/15/2014  . Screening for diabetes mellitus 01/15/2014    Past Surgical History:  Procedure Laterality Date  . COLONOSCOPY    . COLONOSCOPY W/ POLYPECTOMY  02/07/2015  . head surgery    . head surgery    . LAPAROSCOPY ABDOMEN DIAGNOSTIC  03/13/2015  . UPPER GASTROINTESTINAL ENDOSCOPY  04/29/2014    Prior to Admission medications   Medication Sig Start Date End Date Taking? Authorizing Provider  albuterol (PROVENTIL HFA;VENTOLIN HFA) 108 (90 Base) MCG/ACT inhaler Inhale 2 puffs into the lungs every 4 (four) hours as needed for wheezing or shortness of breath. 08/06/15   Irean Hong, MD  albuterol (PROVENTIL HFA;VENTOLIN HFA) 108 (90 Base) MCG/ACT inhaler Inhale 2 puffs into the lungs every 6 (six) hours as needed for wheezing or shortness of breath. 08/20/16   Darci Current, MD  amLODipine (NORVASC) 10 MG tablet Take 10 mg by mouth daily.    [provider]  benzonatate (TESSALON PERLES) 100 MG capsule Take 1 capsule (100 mg total) by mouth every 6 (six) hours as needed for cough. 04/28/16   Rebecka Apley, MD  benzonatate (TESSALON PERLES) 100 MG capsule Take 1 capsule (100 mg total) by mouth 3 (three) times daily as needed for cough (Take 1-2 per dose). 08/20/16   Darci Current, MD  butalbital-acetaminophen-caffeine (FIORICET, ESGIC) [phone] MG tablet Take 1-2 tablets by mouth every 6 (six) hours as needed for headache. 02/25/16 02/24/17  Rebecka Apley, MD  chlorpheniramine-HYDROcodone [facility] PENNKINETIC ER) 10-8 MG/5ML SUER Take 5 mLs by mouth 2 (two) times daily. 08/06/15   Irean Hong, MD  cyclobenzaprine (FLEXERIL) 10 MG tablet Take by mouth. 09/04/14   [provider]  cyclobenzaprine (FLEXERIL) 10 MG tablet Take 1 tablet (10 mg total) by mouth 3 (three) times daily as needed. 06/12/16   Darci Current, MD  diazepam (VALIUM) 5 MG tablet Take 1 tablet (5 mg total) by mouth every 8 (eight) hours as needed for anxiety. 06/11/16   Irean Hong, MD  dicyclomine (BENTYL) 10 MG capsule Take 1 capsule (10 mg total) by mouth 4 (four) times daily -  before meals and at bedtime. 05/18/16   Wyline Mood, MD  fluticasone (  FLONASE) 50 MCG/ACT nasal spray Place 2 sprays into both nostrils daily. 02/23/16   Menshew, Charlesetta Ivory, PA-C  hydrochlorothiazide (HYDRODIURIL) 25 MG tablet Take 25 mg by mouth daily.    [provider]  lidocaine (LIDODERM) 5 % Place 1 patch onto the skin daily. Remove & Discard patch within 12 hours or as directed by MD 06/11/16   Irean Hong, MD  omeprazole (PRILOSEC) 40 MG capsule Take 1 capsule (40 mg total) by mouth daily. 02/06/16 02/05/17  Darci Current, MD  pantoprazole (PROTONIX) 40 MG tablet Take 1 tablet (40 mg total) by mouth daily. 03/20/16 03/20/17  Darci Current, MD  predniSONE (DELTASONE) 20 MG tablet 3 tablets daily 4 days 08/06/15   Irean Hong, MD  predniSONE (DELTASONE) 20 MG tablet Take 3 tablets (60 mg total) by mouth daily. 04/28/16   Rebecka Apley, MD  sertraline (ZOLOFT) 50 MG tablet Take 50 mg by mouth. 09/22/15    [provider]  sildenafil (REVATIO) 20 MG tablet Take by mouth. 04/11/14   [provider]  sucralfate (CARAFATE) 1 g tablet Take 1 tablet (1 g total) by mouth 2 (two) times daily. 02/25/16   Rebecka Apley, MD  sucralfate (CARAFATE) 1 g tablet Take 1 tablet (1 g total) by mouth 2 (two) times daily. 07/10/16   Rebecka Apley, MD  VIRTUSSIN A/C 100-10 MG/5ML syrup  05/05/16   [provider]    Allergies Other  Family History  Problem Relation Age of Onset  . Diabetes Mother   . Multiple sclerosis Brother   . Breast cancer Maternal Aunt   . Lung cancer Maternal Grandfather   . Stomach cancer Paternal Grandmother   . Colon cancer Maternal Uncle     Social History Social History  Substance Use Topics  . Smoking status: Current Every Day Smoker    Packs/day: 0.25    Years: 10.00    Types: Cigarettes  . Smokeless tobacco: Never Used  . Alcohol use Yes    Review of Systems  Constitutional: No fever/chills Eyes: No visual changes. ENT: No sore throat. No stiff neck no neck pain Cardiovascular: Denies chest pain Except for pain in his pectoralis muscle which she can reproduce forming Respiratory: Denies shortness of breath. Gastrointestinal:   no vomiting.  No diarrhea.  No constipation. Genitourinary: Negative for dysuria. Musculoskeletal: Negative lower extremity swelling Skin: Negative for rash. Neurological: Negative for severe headaches, focal weakness or numbness.   ____________________________________________   PHYSICAL EXAM:  VITAL SIGNS: ED Triage Vitals [09/08/16 1830]  Enc Vitals Group     BP 138/88     Pulse Rate 87     Resp 16     Temp 98.1 F (36.7 C)     Temp Source Oral     SpO2 98 %     Weight 216 lb 7.9 oz (98.2 kg)     Height 5\' 9"  (1.753 m)     Head Circumference      Peak Flow      Pain Score 6     Pain Loc      Pain Edu?      Excl. in GC?     Constitutional: Alert and oriented. Well appearing and in no  acute distress. Eyes: Conjunctivae are normal Head: Atraumatic HEENT: No congestion/rhinnorhea. Mucous membranes are moist.  Oropharynx non-erythematous Neck:   Nontender with no meningismus, no masses, no stridor Chest: Tender to palpation the chest wall on the  left when tested area patient states "ouch that's the pain right there" and pulls back. No evidence of flail chest crepitus or rib fracture. Cardiovascular: Normal rate, regular rhythm. Grossly normal heart sounds.  Good peripheral circulation. Respiratory: Normal respiratory effort.  No retractions. Lungs CTAB. Abdominal: Soft and nontender. No distention. No guarding no rebound Back:  There is no focal tenderness or step off.  there is no midline tenderness there are no lesions noted. there is no CVA tenderness  Musculoskeletal: No lower extremity tenderness, no upper extremity tenderness. No joint effusions, no DVT signs strong distal pulses no edema Neurologic:  Normal speech and language. No gross focal neurologic deficits are appreciated.  Skin:  Skin is warm, dry and intact. No rash noted. Psychiatric: Mood and affect are normal. Speech and behavior are normal.  ____________________________________________   LABS (all labs ordered are listed, but only abnormal results are displayed)  Labs Reviewed  BASIC METABOLIC PANEL - Abnormal; Notable for the following:       Result Value   Potassium 3.3 (*)    Calcium 8.4 (*)    All other components within normal limits  CBC  TROPONIN I   ____________________________________________  EKG  I personally interpreted any EKGs ordered by me or triage Sinus rhythm rate 89 beats per an acute ST elevation or depression acute ischemic changes unremarkable EKG ____________________________________________  RADIOLOGY  I reviewed any imaging ordered by me or triage that were performed during my shift and, if possible, patient and/or family made aware of any abnormal  findings. ____________________________________________   PROCEDURES  Procedure(s) performed: None  Procedures  Critical Care performed: None  ____________________________________________   INITIAL IMPRESSION / ASSESSMENT AND PLAN / ED COURSE  Pertinent labs & imaging results that were available during my care of the patient were reviewed by me and considered in my medical decision making (see chart for details).  Patient very well known to this emergency department presents today requesting that I give him a prescription for his pills because a having, monilia. He also has chest wall pain which is been there uninterrupted Nedra HaiLee for several days is quite reproducible and has he had another negative troponin, had one as well yesterday.At this time, there does not appear to be clinical evidence to support the diagnosis of pulmonary embolus, dissection, myocarditis, endocarditis, pericarditis, pericardial tamponade, acute coronary syndrome, pneumothorax, pneumonia, or any other acute intrathoracic pathology that will require admission or acute intervention. Nor is there evidence of any significant intra-abdominal pathology causing this discomfort. He also is complaining of chronic reflux disease although he has no pain or symptoms with that he "knows acid is there" because he didn't take his pill. Nothing to suggest gallbladder disease or any other acute intra-abdominal pathology such as ulceration or perforation etc. No melena no bleeding. Happy to provide patient with a prescription for his home medications and I think no further emergent evaluation is indicated at this time. Return precautions and follow-up given and understood however    ____________________________________________   FINAL CLINICAL IMPRESSION(S) / ED DIAGNOSES  Final diagnoses:  None      This chart was dictated using voice recognition software.  Despite best efforts to proofread,  errors can occur which can change  meaning.      Jeanmarie PlantMcShane, Yahye Siebert A, MD 09/08/16 2010

## 2016-09-13 ENCOUNTER — Encounter: Payer: Self-pay | Admitting: *Deleted

## 2016-09-13 ENCOUNTER — Emergency Department
Admission: EM | Admit: 2016-09-13 | Discharge: 2016-09-13 | Disposition: A | Discharge status: Home / Self Care | Payer: BLUE CROSS/BLUE SHIELD | Attending: Emergency Medicine | Admitting: Emergency Medicine

## 2016-09-13 DIAGNOSIS — I10 Essential (primary) hypertension: Secondary | ICD-10-CM | POA: Diagnosis not present

## 2016-09-13 DIAGNOSIS — J069 Acute upper respiratory infection, unspecified: Secondary | ICD-10-CM | POA: Insufficient documentation

## 2016-09-13 DIAGNOSIS — Z79899 Other long term (current) drug therapy: Secondary | ICD-10-CM | POA: Diagnosis not present

## 2016-09-13 DIAGNOSIS — R05 Cough: Secondary | ICD-10-CM | POA: Insufficient documentation

## 2016-09-13 DIAGNOSIS — F17210 Nicotine dependence, cigarettes, uncomplicated: Secondary | ICD-10-CM | POA: Diagnosis not present

## 2016-09-13 DIAGNOSIS — B9789 Other viral agents as the cause of diseases classified elsewhere: Secondary | ICD-10-CM

## 2016-09-13 DIAGNOSIS — H9201 Otalgia, right ear: Secondary | ICD-10-CM | POA: Diagnosis present

## 2016-09-13 LAB — POCT RAPID STREP A: STREPTOCOCCUS, GROUP A SCREEN (DIRECT): NEGATIVE

## 2016-09-13 MED ORDER — ANTIPYRINE-BENZOCAINE 5.4-1.4 % OT SOLN: 3.0000 [drp] | OTIC | 0 refills | Status: DC | PRN

## 2016-09-13 NOTE — ED Triage Notes (Signed)
Pt has right earache, headache and sore throat for 3 days.  States feeling worse today.  Pt is hoarse.  Pt alert.

## 2016-09-13 NOTE — Discharge Instructions (Signed)

## 2016-09-13 NOTE — ED Provider Notes (Signed)
[facility] Emergency Department Provider Note  ____________________________________________   First MD Initiated Contact with Patient 09/13/16 [phone]     (approximate)  I have reviewed the triage vital signs and the nursing notes.   HISTORY  Chief Complaint Otalgia and Cough    HPI Douglas Mcdaniel is a 56 y.o. male who is well-known to this emergency Department with 9 visits in the last 6 months as well as numerous visits to the [facility] emergency department.  He presents tonight for evaluation of right earache, generalized headache, hoarse voice, sinus pain, and a light cough.  He reports that the symptoms have been gradually getting worse over the last 3-4 days.  He describes the symptoms as severe.  Nothing in particular makes the patient's symptoms better nor worse.  He denies any difficulty swallowing and when I ask him he denies sore throat although he did report it at triage.  He denies fever/chills, visual changes, neck pain, neck stiffness, shortness of breath, chest pain, abdominal pain.Of note, he was seen in this emergency department less than 5 days ago for chest pain and headache and was seen in [facility] 6 days ago for chest wall pain.    Past Medical History:  Diagnosis Date  . Bilateral low back pain without sciatica 03/05/2014  . Bladder wall thickening 02/26/2014   Last Assessment & Plan:  - CT with bladder wall thickening possibly related to infection v. Neoplasm. - UA and urine cytology pending. - Urology referral for cystoscopy.  . Chronic abdominal pain 08/13/2015   Overview:  Chart Review:.  03/13/15- He underwent an exploratory laparotomy with all examined abdominal contents appearing normal including the entirety of the small bowel, appendix and colon, with no evidence of internal hernia. 03/17/15- CT: Segmental wall thickening in the proximal small bowel with mild dilatation, possibly enteritis, less likely obstruction. Correlate  with recent laparoscopy.Smal  . 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 Anxiety. - Will continue to follow.  . Dental infection 02/26/2014  . Dyspepsia 01/15/2014   Last Assessment & Plan:  - Chronic, stable. - Refilled Pepcid.  . Elevated creatine kinase 07/17/2014   Last Assessment & Plan:  - Trending up 216 --> 330 --> 407 --> 295. 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  Other differential includes inflammatory myopathy (dermatomyositis v. Polymyositis) v. v. Medication effect.  Unlikely hypothyroidism as normal TSH. - Denies alcohol and cocaine use. - Continue to monitor.  [name] Epidermal inclusion cyst 08/13/2015   Last Assessment & Plan:  Discussed excision of the cyst in the future. Patient is in agreement.   . Erectile dysfunction 04/11/2014  . Generalized anxiety disorder 01/15/2014   Last Assessment & Plan:  - Counseled on importance of re-starting Zoloft 50 mg once daily. - Concern that patient has been presenting to the ED multiple times for array of symptoms likely related to anxiety. - Encouraged to make an appointment at [facility] if patient feels like he needs to go to ED as we have same-day appointments available.  Patient in agreement. - Close follow-up at [facility] in one week.  [name] GERD (gastroesophageal reflux disease)   . Hemorrhoids 04/05/2014  . Hypertension   . Left inguinal hernia 03/05/2014  . Nonintractable episodic headache 07/17/2014   Last Assessment & Plan:  - Concern that patient has been presenting to the ED with 7 visits since 3/2 for  similar symptoms.  Has had multiple rounds of antibiotics.  See chart review in subjective section. - Encouraged to make an appointment at [facility] if patient feels like he needs to go to ED as we have same-day appointments available.  Patient in agreement. - Recommend continuing Flonase and using   . Screening for diabetes mellitus 01/15/2014  . Tobacco use 09/16/2014    . Toe pain, right 04/05/2014  . Tonsillitis 02/12/2014    Patient Active Problem List   Diagnosis Date Noted  . Hypertension 04/26/2016  . Chronic abdominal pain 08/13/2015  . Epidermal inclusion cyst 08/13/2015  . Tobacco use 09/16/2014  . 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 Elevated creatine kinase 07/17/2014  . Nonintractable episodic headache 07/17/2014  . Erectile dysfunction 04/11/2014  . Hemorrhoids 04/05/2014  . Toe pain, right 04/05/2014  . Bilateral low back pain without sciatica 03/05/2014  . Left inguinal hernia 03/05/2014  . Bladder wall thickening 02/26/2014  . Dental infection 02/26/2014  . Tonsillitis 02/12/2014  . Dyspepsia 01/15/2014  . Generalized anxiety disorder 01/15/2014  . GERD (gastroesophageal reflux disease) 01/15/2014  . Screening for diabetes mellitus 01/15/2014    Past Surgical History:  Procedure Laterality Date  . COLONOSCOPY    . COLONOSCOPY W/ POLYPECTOMY  02/07/2015  . head surgery    . head surgery    . LAPAROSCOPY ABDOMEN DIAGNOSTIC  03/13/2015  . UPPER GASTROINTESTINAL ENDOSCOPY  04/29/2014    Prior to Admission medications   Medication Sig Start Date End Date Taking? Authorizing Provider  albuterol (PROVENTIL HFA;VENTOLIN HFA) 108 (90 Base) MCG/ACT inhaler Inhale 2 puffs into the lungs every 4 (four) hours as needed for wheezing or shortness of breath. 08/06/15   Irean Hong, MD  albuterol (PROVENTIL HFA;VENTOLIN HFA) 108 (90 Base) MCG/ACT inhaler Inhale 2 puffs into the lungs every 6 (six) hours as needed for wheezing or shortness of breath. 08/20/16   Darci Current, MD  amLODipine (NORVASC) 10 MG tablet Take 1 tablet (10 mg total) by mouth daily. 09/08/16   Jeanmarie Plant, MD  antipyrine-benzocaine Lyla Son) OTIC solution Place 3-4 drops into the right ear every 2 (two) hours as needed for ear pain. 09/13/16   Loleta Rose, MD  benzonatate (TESSALON PERLES) 100 MG capsule Take 1 capsule (100 mg total) by mouth every 6 (six) hours as  needed for cough. 04/28/16   Rebecka Apley, MD  benzonatate (TESSALON PERLES) 100 MG capsule Take 1 capsule (100 mg total) by mouth 3 (three) times daily as needed for cough (Take 1-2 per dose). 08/20/16   Darci Current, MD  butalbital-acetaminophen-caffeine (FIORICET, ESGIC) [phone] MG tablet Take 1-2 tablets by mouth every 6 (six) hours as needed for headache. 02/25/16 02/24/17  Rebecka Apley, MD  chlorpheniramine-HYDROcodone [facility] PENNKINETIC ER) 10-8 MG/5ML SUER Take 5 mLs by mouth 2 (two) times daily. 08/06/15   Irean Hong, MD  cyclobenzaprine (FLEXERIL) 10 MG tablet Take by mouth. 09/04/14   [provider]  cyclobenzaprine (FLEXERIL) 10 MG tablet Take 1 tablet (10 mg total) by mouth 3 (three) times daily as needed. 06/12/16   Darci Current, MD  diazepam (VALIUM) 5 MG tablet Take 1 tablet (5 mg total) by mouth every 8 (eight) hours as needed for anxiety. 06/11/16   Irean Hong, MD  dicyclomine (BENTYL) 10 MG capsule Take 1 capsule (10 mg total) by mouth 4 (four) times daily -  before meals and at bedtime. 05/18/16   Wyline Mood, MD  fluticasone Aleda Grana)  50 MCG/ACT nasal spray Place 2 sprays into both nostrils daily. 02/23/16   Menshew, Charlesetta Ivory, PA-C  hydrochlorothiazide (HYDRODIURIL) 25 MG tablet Take 1 tablet (25 mg total) by mouth daily. 09/08/16   Jeanmarie Plant, MD  lidocaine (LIDODERM) 5 % Place 1 patch onto the skin daily. Remove & Discard patch within 12 hours or as directed by MD 06/11/16   Irean Hong, MD  omeprazole (PRILOSEC) 40 MG capsule Take 1 capsule (40 mg total) by mouth daily. 09/08/16 09/08/17  Jeanmarie Plant, MD  pantoprazole (PROTONIX) 40 MG tablet Take 1 tablet (40 mg total) by mouth daily. 03/20/16 03/20/17  Darci Current, MD  predniSONE (DELTASONE) 20 MG tablet 3 tablets daily 4 days 08/06/15   Irean Hong, MD  predniSONE (DELTASONE) 20 MG tablet Take 3 tablets (60 mg total) by mouth daily. 04/28/16   Rebecka Apley, MD  sertraline  (ZOLOFT) 50 MG tablet Take 50 mg by mouth. 09/22/15   [provider]  sildenafil (REVATIO) 20 MG tablet Take by mouth. 04/11/14   [provider]  sucralfate (CARAFATE) 1 g tablet Take 1 tablet (1 g total) by mouth 2 (two) times daily. 02/25/16   Rebecka Apley, MD  sucralfate (CARAFATE) 1 g tablet Take 1 tablet (1 g total) by mouth 2 (two) times daily. 07/10/16   Rebecka Apley, MD  VIRTUSSIN A/C 100-10 MG/5ML syrup  05/05/16   [provider]    Allergies Other  Family History  Problem Relation Age of Onset  . Diabetes Mother   . Multiple sclerosis Brother   . Breast cancer Maternal Aunt   . Lung cancer Maternal Grandfather   . Stomach cancer Paternal Grandmother   . Colon cancer Maternal Uncle     Social History Social History  Substance Use Topics  . Smoking status: Current Every Day Smoker    Packs/day: 0.25    Years: 10.00    Types: Cigarettes  . Smokeless tobacco: Never Used  . Alcohol use Yes    Review of Systems Constitutional: No fever/chills Eyes: No visual changes. ENT: No sore throat But hoarse voice.  Right ear pain.  Sinus/facial pain Cardiovascular: Denies chest pain. Respiratory: Denies shortness of breath.  Mild nonproductive cough Gastrointestinal: No abdominal pain.  No nausea, no vomiting.  No diarrhea.  No constipation.  ____________________________________________   PHYSICAL EXAM:  VITAL SIGNS: ED Triage Vitals  Enc Vitals Group     BP 09/13/16 0059 (!) 146/83     Pulse Rate 09/13/16 0059 88     Resp 09/13/16 0059 20     Temp 09/13/16 0059 97.9 F (36.6 C)     Temp Source 09/13/16 0059 Oral     SpO2 09/13/16 0059 96 %     Weight 09/13/16 0100 97.5 kg (215 lb)     Height 09/13/16 0100 1.753 m (5\' 9" )     Head Circumference --      Peak Flow --      Pain Score 09/13/16 0058 9     Pain Loc --      Pain Edu? --      Excl. in GC? --     Constitutional: Alert and oriented. Well appearing and in no acute  distress. Eyes: Conjunctivae are normal.  Head: Atraumatic. Ears:  Healthy appearing ear canals and TMs bilaterally, very clean and easily visualized.  He has some mild erythema in both canals consistent with superficial abrasions from cleaning, and  this erythema has been documented on prior notes in the EMR.  No evidence of effusion behind TMs. Nose: No congestion/rhinnorhea. Mouth/Throat: Mucous membranes are moist.  Oropharynx non-erythematous.  No petechiae on the palate, no lesions, no exudate.  No swelling, no uvular deviation.  No evidence of peritonsillar abscess.  The patient is speaking with a quiet and hoarse voice.  No difficulty swallowing secretions. Neck: No stridor.  No meningeal signs.   Cardiovascular: Normal rate, regular rhythm. Good peripheral circulation. Grossly normal heart sounds. Respiratory: Normal respiratory effort.  No retractions. Lungs CTAB. Neurologic:  Normal speech and language other than the hoarse voice mentioned above. No gross focal neurologic deficits are appreciated.  Skin:  Skin is warm, dry and intact. No rash noted. Psychiatric: Mood and affect are normal. Speech and behavior are normal.  ____________________________________________   LABS (all labs ordered are listed, but only abnormal results are displayed)  Labs Reviewed  CULTURE, GROUP A STREP [facility])  POCT RAPID STREP A   ____________________________________________  EKG  None - EKG not ordered by ED physician ____________________________________________  RADIOLOGY   No results found.  ____________________________________________   PROCEDURES  Critical Care performed: No   Procedure(s) performed:   Procedures   ____________________________________________   INITIAL IMPRESSION / ASSESSMENT AND PLAN / ED COURSE  Pertinent labs & imaging results that were available during my care of the patient were reviewed by me and considered in my medical decision making (see chart  for details).  The patient is well-appearing and in no acute distress with normal vital signs and a negative rapid strep.  His signs and symptoms are consistent with a mild viral URI that is resulting in his hoarse voice or loss of voice, ear pain, sinus pain, and mild cough.  His lung sounds are clear and he has no evidence of acute pharyngitis/tonsillitis.  He is ears are very clean and easily visualized and there is no evidence of otitis media.  He has no swelling or tenderness in his throat and denies sore throat to me and I do not suspect peritonsillar abscess, retropharyngeal abscess, Ludwig angina, or other deep space infection of the neck based on his physical exam and presentation.   I gave him the reassuring findings and encouraged ibuprofen, Tylenol, plenty of fluids, and outpatient follow-up.  I am giving him a prescription for Auralgan for the ear discomfort but I told him that my understanding is that it is hard to come by these days and that it may not be available at the pharmacy.  He seems somewhat displeased but I explained that his symptoms are viral and that antibiotics will not help.  I gave my usual and customary return precautions.  He asked me to hurry with the discharge because he needs to be at work and 30 minutes.      ____________________________________________  FINAL CLINICAL IMPRESSION(S) / ED DIAGNOSES  Final diagnoses:  Viral URI with cough  Right ear pain     MEDICATIONS GIVEN DURING THIS VISIT:  Medications - No data to display   NEW OUTPATIENT MEDICATIONS STARTED DURING THIS VISIT:  New Prescriptions   ANTIPYRINE-BENZOCAINE (AURALGAN) OTIC SOLUTION    Place 3-4 drops into the right ear every 2 (two) hours as needed for ear pain.    Modified Medications   No medications on file    Discontinued Medications   No medications on file     Note:  This document was prepared using Dragon voice recognition software and may include  unintentional  dictation errors.    Loleta RoseForbach, Jochebed Bills, MD 09/13/16 [phone]

## 2016-09-13 NOTE — ED Notes (Signed)
MD York CeriseForbach at bedsdie with this RN

## 2016-09-13 NOTE — ED Notes (Signed)

## 2016-09-15 ENCOUNTER — Emergency Department
Admission: EM | Admit: 2016-09-15 | Discharge: 2016-09-15 | Disposition: A | Discharge status: Home / Self Care | Payer: BLUE CROSS/BLUE SHIELD | Attending: Emergency Medicine | Admitting: Emergency Medicine

## 2016-09-15 ENCOUNTER — Encounter: Payer: Self-pay | Admitting: Emergency Medicine

## 2016-09-15 ENCOUNTER — Other Ambulatory Visit: Payer: Self-pay

## 2016-09-15 ENCOUNTER — Emergency Department: Payer: BLUE CROSS/BLUE SHIELD

## 2016-09-15 DIAGNOSIS — R0789 Other chest pain: Secondary | ICD-10-CM | POA: Diagnosis not present

## 2016-09-15 DIAGNOSIS — F17210 Nicotine dependence, cigarettes, uncomplicated: Secondary | ICD-10-CM | POA: Insufficient documentation

## 2016-09-15 DIAGNOSIS — Z79899 Other long term (current) drug therapy: Secondary | ICD-10-CM | POA: Diagnosis not present

## 2016-09-15 DIAGNOSIS — I10 Essential (primary) hypertension: Secondary | ICD-10-CM | POA: Diagnosis not present

## 2016-09-15 DIAGNOSIS — R079 Chest pain, unspecified: Secondary | ICD-10-CM | POA: Diagnosis present

## 2016-09-15 LAB — BASIC METABOLIC PANEL
Anion gap: 5 (ref 5–15)
BUN: 15 mg/dL (ref 6–20)
CALCIUM: 8.7 mg/dL — AB (ref 8.9–10.3)
CO2: 31 mmol/L (ref 22–32)
CREATININE: 1.09 mg/dL (ref 0.61–1.24)
Chloride: 105 mmol/L (ref 101–111)
GFR calc Af Amer: >60 mL/min (ref >60)
GFR calc non Af Amer: >60 mL/min (ref >60)
GLUCOSE: 110 mg/dL — AB (ref 65–99)
Potassium: 3.8 mmol/L (ref 3.5–5.1)
Sodium: 141 mmol/L (ref 135–145)

## 2016-09-15 LAB — TROPONIN I

## 2016-09-15 LAB — CBC
HCT: 46.8 % (ref 40.0–52.0)
Hemoglobin: 15.7 g/dL (ref 13.0–18.0)
MCH: 29.8 pg (ref 26.0–34.0)
MCHC: 33.5 g/dL (ref 32.0–36.0)
MCV: 88.9 fL (ref 80.0–100.0)
PLATELETS: 202 10*3/uL (ref 150–440)
RBC: 5.27 MIL/uL (ref 4.40–5.90)
RDW: 14.4 % (ref 11.5–14.5)
WBC: 5.3 10*3/uL (ref 3.8–10.6)

## 2016-09-15 LAB — CULTURE, GROUP A STREP (THRC)

## 2016-09-15 NOTE — Discharge Instructions (Signed)
Please seek medical attention for any high fevers, chest pain, shortness of breath, change in behavior, persistent vomiting, bloody stool or any other new or concerning symptoms.  

## 2016-09-15 NOTE — ED Provider Notes (Signed)
[facility] Emergency Department Provider Note   ____________________________________________   I have reviewed the triage vital signs and the nursing notes.   HISTORY  Chief Complaint Chest Pain   History limited by: Not Limited   HPI Douglas Mcdaniel is a 56 y.o. male who presents to the emergency department today because of concerns for chest pain. It is located in the left side of his chest. Has been going on for greater than 1 week. He describes it as sharp. It is worse with palpation and movement of his left arm. Has been evaluated in this emergency Department for this pain. He states he was told it was musculoskeletal. He denies any shortness of breath, cough or fever.   Past Medical History:  Diagnosis Date  . Bilateral low back pain without sciatica 03/05/2014  . Bladder wall thickening 02/26/2014   Last Assessment & Plan:  - CT with bladder wall thickening possibly related to infection v. Neoplasm. - UA and urine cytology pending. - Urology referral for cystoscopy.  . Chronic abdominal pain 08/13/2015   Overview:  Chart Review:.  03/13/15- He underwent an exploratory laparotomy with all examined abdominal contents appearing normal including the entirety of the small bowel, appendix and colon, with no evidence of internal hernia. 03/17/15- CT: Segmental wall thickening in the proximal small bowel with mild dilatation, possibly enteritis, less likely obstruction. Correlate with recent laparoscopy.Smal  . 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 Anxiety. - Will continue to follow.  . Dental infection 02/26/2014  . Dyspepsia 01/15/2014   Last Assessment & Plan:  - Chronic, stable. - Refilled Pepcid.  . Elevated creatine kinase 07/17/2014   Last Assessment & Plan:  - Trending up 216 --> 330 --> 407 --> 295. 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  Other differential  includes inflammatory myopathy (dermatomyositis v. Polymyositis) v. v. Medication effect.  Unlikely hypothyroidism as normal TSH. - Denies alcohol and cocaine use. - Continue to monitor.  [name] Epidermal inclusion cyst 08/13/2015   Last Assessment & Plan:  Discussed excision of the cyst in the future. Patient is in agreement.   . Erectile dysfunction 04/11/2014  . Generalized anxiety disorder 01/15/2014   Last Assessment & Plan:  - Counseled on importance of re-starting Zoloft 50 mg once daily. - Concern that patient has been presenting to the ED multiple times for array of symptoms likely related to anxiety. - Encouraged to make an appointment at [facility] if patient feels like he needs to go to ED as we have same-day appointments available.  Patient in agreement. - Close follow-up at [facility] in one week.  [name] GERD (gastroesophageal reflux disease)   . Hemorrhoids 04/05/2014  . Hypertension   . Left inguinal hernia 03/05/2014  . Nonintractable episodic headache 07/17/2014   Last Assessment & Plan:  - Concern that patient has been presenting to the ED with 7 visits since 3/2 for similar symptoms.  Has had multiple rounds of antibiotics.  See chart review in subjective section. - Encouraged to make an appointment at [facility] if patient feels like he needs to go to ED as we have same-day appointments available.  Patient in agreement. - Recommend continuing Flonase and using   . Screening for diabetes mellitus 01/15/2014  . Tobacco use 09/16/2014  . Toe pain, right 04/05/2014  . Tonsillitis 02/12/2014    Patient Active Problem List   Diagnosis Date Noted  .  Hypertension 04/26/2016  . Chronic abdominal pain 08/13/2015  . Epidermal inclusion cyst 08/13/2015  . Tobacco use 09/16/2014  . 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 Elevated creatine kinase 07/17/2014  . Nonintractable episodic headache 07/17/2014  . Erectile dysfunction 04/11/2014  . Hemorrhoids 04/05/2014  . Toe pain, right 04/05/2014  . Bilateral low back pain  without sciatica 03/05/2014  . Left inguinal hernia 03/05/2014  . Bladder wall thickening 02/26/2014  . Dental infection 02/26/2014  . Tonsillitis 02/12/2014  . Dyspepsia 01/15/2014  . Generalized anxiety disorder 01/15/2014  . GERD (gastroesophageal reflux disease) 01/15/2014  . Screening for diabetes mellitus 01/15/2014    Past Surgical History:  Procedure Laterality Date  . COLONOSCOPY    . COLONOSCOPY W/ POLYPECTOMY  02/07/2015  . head surgery    . head surgery    . LAPAROSCOPY ABDOMEN DIAGNOSTIC  03/13/2015  . UPPER GASTROINTESTINAL ENDOSCOPY  04/29/2014    Prior to Admission medications   Medication Sig Start Date End Date Taking? Authorizing Provider  albuterol (PROVENTIL HFA;VENTOLIN HFA) 108 (90 Base) MCG/ACT inhaler Inhale 2 puffs into the lungs every 4 (four) hours as needed for wheezing or shortness of breath. 08/06/15   Irean Hong, MD  albuterol (PROVENTIL HFA;VENTOLIN HFA) 108 (90 Base) MCG/ACT inhaler Inhale 2 puffs into the lungs every 6 (six) hours as needed for wheezing or shortness of breath. 08/20/16   Darci Current, MD  amLODipine (NORVASC) 10 MG tablet Take 1 tablet (10 mg total) by mouth daily. 09/08/16   Jeanmarie Plant, MD  antipyrine-benzocaine Lyla Son) OTIC solution Place 3-4 drops into the right ear every 2 (two) hours as needed for ear pain. 09/13/16   Loleta Rose, MD  benzonatate (TESSALON PERLES) 100 MG capsule Take 1 capsule (100 mg total) by mouth every 6 (six) hours as needed for cough. 04/28/16   Rebecka Apley, MD  benzonatate (TESSALON PERLES) 100 MG capsule Take 1 capsule (100 mg total) by mouth 3 (three) times daily as needed for cough (Take 1-2 per dose). 08/20/16   Darci Current, MD  butalbital-acetaminophen-caffeine (FIORICET, ESGIC) [phone] MG tablet Take 1-2 tablets by mouth every 6 (six) hours as needed for headache. 02/25/16 02/24/17  Rebecka Apley, MD  chlorpheniramine-HYDROcodone [facility] PENNKINETIC ER) 10-8 MG/5ML  SUER Take 5 mLs by mouth 2 (two) times daily. 08/06/15   Irean Hong, MD  cyclobenzaprine (FLEXERIL) 10 MG tablet Take by mouth. 09/04/14   [provider]  cyclobenzaprine (FLEXERIL) 10 MG tablet Take 1 tablet (10 mg total) by mouth 3 (three) times daily as needed. 06/12/16   Darci Current, MD  diazepam (VALIUM) 5 MG tablet Take 1 tablet (5 mg total) by mouth every 8 (eight) hours as needed for anxiety. 06/11/16   Irean Hong, MD  dicyclomine (BENTYL) 10 MG capsule Take 1 capsule (10 mg total) by mouth 4 (four) times daily -  before meals and at bedtime. 05/18/16   Wyline Mood, MD  fluticasone [facility]) 50 MCG/ACT nasal spray Place 2 sprays into both nostrils daily. 02/23/16   Menshew, Charlesetta Ivory, PA-C  hydrochlorothiazide (HYDRODIURIL) 25 MG tablet Take 1 tablet (25 mg total) by mouth daily. 09/08/16   Jeanmarie Plant, MD  lidocaine (LIDODERM) 5 % Place 1 patch onto the skin daily. Remove & Discard patch within 12 hours or as directed by MD 06/11/16   Irean Hong, MD  omeprazole (PRILOSEC) 40 MG capsule Take 1 capsule (40 mg total) by mouth daily. 09/08/16  09/08/17  Jeanmarie PlantMcShane, James A, MD  pantoprazole (PROTONIX) 40 MG tablet Take 1 tablet (40 mg total) by mouth daily. 03/20/16 03/20/17  Darci CurrentBrown, [place] N, MD  predniSONE (DELTASONE) 20 MG tablet 3 tablets daily 4 days 08/06/15   Irean HongSung, Jade J, MD  predniSONE (DELTASONE) 20 MG tablet Take 3 tablets (60 mg total) by mouth daily. 04/28/16   Rebecka ApleyWebster, Allison P, MD  sertraline (ZOLOFT) 50 MG tablet Take 50 mg by mouth. 09/22/15   [provider]  sildenafil (REVATIO) 20 MG tablet Take by mouth. 04/11/14   [provider]  sucralfate (CARAFATE) 1 g tablet Take 1 tablet (1 g total) by mouth 2 (two) times daily. 02/25/16   Rebecka ApleyWebster, Allison P, MD  sucralfate (CARAFATE) 1 g tablet Take 1 tablet (1 g total) by mouth 2 (two) times daily. 07/10/16   Rebecka ApleyWebster, Allison P, MD  VIRTUSSIN A/C 100-10 MG/5ML syrup  05/05/16   [provider]     Allergies Other  Family History  Problem Relation Age of Onset  . Diabetes Mother   . Multiple sclerosis Brother   . Breast cancer Maternal Aunt   . Lung cancer Maternal Grandfather   . Stomach cancer Paternal Grandmother   . Colon cancer Maternal Uncle     Social History Social History  Substance Use Topics  . Smoking status: Current Every Day Smoker    Packs/day: 0.25    Years: 10.00    Types: Cigarettes  . Smokeless tobacco: Never Used  . Alcohol use Yes    Review of Systems Constitutional: No fever/chills Eyes: No visual changes. ENT: No sore throat. Cardiovascular: Positive for chest pain Respiratory: Denies shortness of breath. Gastrointestinal: No abdominal pain.  No nausea, no vomiting.  No diarrhea.   Genitourinary: Negative for dysuria. Musculoskeletal: Negative for back pain. Skin: Negative for rash. Neurological: Negative for headaches, focal weakness or numbness.  ____________________________________________   PHYSICAL EXAM:  VITAL SIGNS: ED Triage Vitals  Enc Vitals Group     BP 09/15/16 0555 121/74     Pulse Rate 09/15/16 0555 77     Resp 09/15/16 0555 20     Temp 09/15/16 0555 97.7 F (36.5 C)     Temp Source 09/15/16 0555 Oral     SpO2 09/15/16 0555 96 %     Weight 09/15/16 0551 215 lb (97.5 kg)     Height 09/15/16 0551 5\' 9"  (1.753 m)     Head Circumference --      Peak Flow --      Pain Score 09/15/16 0551 9   Constitutional: Alert and oriented. Well appearing and in no distress. Eyes: Conjunctivae are normal.  ENT   Head: Normocephalic and atraumatic.   Nose: No congestion/rhinnorhea.   Mouth/Throat: Mucous membranes are moist.   Neck: No stridor. Hematological/Lymphatic/Immunilogical: No cervical lymphadenopathy. Cardiovascular: Normal rate, regular rhythm.  No murmurs, rubs, or gallops.  Respiratory: Normal respiratory effort without tachypnea nor retractions. Breath sounds are clear and equal bilaterally. No  wheezes/rales/rhonchi. Gastrointestinal: Soft and non tender. No rebound. No guarding.  Genitourinary: Deferred Musculoskeletal: Left chest wall tender to palpation. Neurologic:  Normal speech and language. No gross focal neurologic deficits are appreciated.  Skin:  Skin is warm, dry and intact. No rash noted. Psychiatric: Mood and affect are normal. Speech and behavior are normal. Patient exhibits appropriate insight and judgment.  ____________________________________________    LABS (pertinent positives/negatives)  Labs Reviewed  BASIC METABOLIC PANEL - Abnormal; Notable for the following:  Result Value   Glucose, Bld 110 (*)    Calcium 8.7 (*)    All other components within normal limits  CBC  TROPONIN I    ____________________________________________   EKG  I, Phineas Semen, attending physician, personally viewed and interpreted this EKG  EKG Time: 0553 Rate: 83 Rhythm: normal sinus rhythm Axis: normal Intervals: qtc 426 QRS: narrow, q waves V1 ST changes: no st elevation Impression: abnormal ekg   ____________________________________________    RADIOLOGY  CXR IMPRESSION:  No active cardiopulmonary disease. No interval change.    ____________________________________________   PROCEDURES  Procedures  ____________________________________________   INITIAL IMPRESSION / ASSESSMENT AND PLAN / ED COURSE  Pertinent labs & imaging results that were available during my care of the patient were reviewed by me and considered in my medical decision making (see chart for details).  Patient presented to the emergency department today because of concerns for chest pain. Patient has been seen in multiple ED 7 over the past week for this. Today's workup without any concerning findings. He is tender to palpation. I do think this is likely muscle skeletal. Will give patient primary care follow-up.  ____________________________________________   FINAL  CLINICAL IMPRESSION(S) / ED DIAGNOSES  Final diagnoses:  Chest wall pain     Note: This dictation was prepared with Dragon dictation. Any transcriptional errors that result from this process are unintentional     Phineas Semen, MD 09/15/16 [id number]

## 2016-09-15 NOTE — ED Triage Notes (Addendum)
Patient to ER for c/o mid chest pain x1 week, worse today. Patient denies shortness of breath. Describes pain as a sharp stabbing pain, worsens with movement. Reports doing recent heavy lifting. Reports being diaphoretic last night.

## 2016-09-15 NOTE — ED Notes (Signed)
Pt verbalized understanding of discharge instructions. NAD at this time. 

## 2016-10-21 ENCOUNTER — Emergency Department
Admission: EM | Admit: 2016-10-21 | Discharge: 2016-10-21 | Disposition: A | Discharge status: Home / Self Care | Payer: BLUE CROSS/BLUE SHIELD | Attending: Emergency Medicine | Admitting: Emergency Medicine

## 2016-10-21 ENCOUNTER — Encounter: Payer: Self-pay | Admitting: Emergency Medicine

## 2016-10-21 ENCOUNTER — Emergency Department: Payer: BLUE CROSS/BLUE SHIELD

## 2016-10-21 ENCOUNTER — Other Ambulatory Visit: Payer: Self-pay

## 2016-10-21 DIAGNOSIS — Z79899 Other long term (current) drug therapy: Secondary | ICD-10-CM | POA: Insufficient documentation

## 2016-10-21 DIAGNOSIS — F17210 Nicotine dependence, cigarettes, uncomplicated: Secondary | ICD-10-CM | POA: Diagnosis not present

## 2016-10-21 DIAGNOSIS — R079 Chest pain, unspecified: Secondary | ICD-10-CM | POA: Diagnosis not present

## 2016-10-21 DIAGNOSIS — R109 Unspecified abdominal pain: Secondary | ICD-10-CM | POA: Diagnosis present

## 2016-10-21 DIAGNOSIS — I10 Essential (primary) hypertension: Secondary | ICD-10-CM | POA: Diagnosis not present

## 2016-10-21 DIAGNOSIS — R1013 Epigastric pain: Secondary | ICD-10-CM | POA: Insufficient documentation

## 2016-10-21 LAB — BASIC METABOLIC PANEL
Anion gap: 7 (ref 5–15)
BUN: 14 mg/dL (ref 6–20)
CHLORIDE: 107 mmol/L (ref 101–111)
CO2: 28 mmol/L (ref 22–32)
CREATININE: 1.27 mg/dL — AB (ref 0.61–1.24)
Calcium: 8.6 mg/dL — ABNORMAL LOW (ref 8.9–10.3)
Glucose, Bld: 160 mg/dL — ABNORMAL HIGH (ref 65–99)
POTASSIUM: 3.4 mmol/L — AB (ref 3.5–5.1)
Sodium: 142 mmol/L (ref 135–145)

## 2016-10-21 LAB — CBC
HEMATOCRIT: 44.3 % (ref 40.0–52.0)
Hemoglobin: 15.3 g/dL (ref 13.0–18.0)
MCH: 30 pg (ref 26.0–34.0)
MCHC: 34.6 g/dL (ref 32.0–36.0)
MCV: 86.7 fL (ref 80.0–100.0)
PLATELETS: 260 10*3/uL (ref 150–440)
RBC: 5.11 MIL/uL (ref 4.40–5.90)
RDW: 15 % — ABNORMAL HIGH (ref 11.5–14.5)
WBC: 5.5 10*3/uL (ref 3.8–10.6)

## 2016-10-21 LAB — TROPONIN I: Troponin I: <0.03 ng/mL (ref <0.03)

## 2016-10-21 LAB — LIPASE, BLOOD: LIPASE: 31 U/L (ref 11–51)

## 2016-10-21 MED ORDER — GI COCKTAIL ~~LOC~~
30.0000 mL | Freq: Once | ORAL | Status: AC
  Administered 2016-10-21: 30 mL via ORAL

## 2016-10-21 MED ORDER — GI COCKTAIL ~~LOC~~
ORAL | Status: AC
  Filled 2016-10-21: qty 30

## 2016-10-21 NOTE — ED Triage Notes (Signed)
Pt arrived with complaints of upper abdominal/chest pain. Pt states the pain feel tight and burning. Pt states has pain has been intermittent over the last few days, pt initially attributed pain to indigestion/gas.

## 2016-10-21 NOTE — ED Provider Notes (Signed)
[facility] Emergency Department Provider Note _____   First MD Initiated Contact with Patient 10/21/16 [phone]     (approximate)  I have reviewed the triage vital signs and the nursing notes.   HISTORY  Chief Complaint Abdominal Pain and Chest Pain   HPI Douglas Mcdaniel is a 56 y.o. male presents to the emergency department with intermittent epigastric pain over "last few days". Patient states that he believes his discomfort is secondary to indigestion or gas. Patient denies any vomiting no diarrhea or constipation. Patient states his current pain score is 8 out of 10 and nonradiating. Patient admits to having previous episodes similar to this in the past.   Past Medical History:  Diagnosis Date  . Bilateral low back pain without sciatica 03/05/2014  . Bladder wall thickening 02/26/2014   Last Assessment & Plan:  - CT with bladder wall thickening possibly related to infection v. Neoplasm. - UA and urine cytology pending. - Urology referral for cystoscopy.  . Chronic abdominal pain 08/13/2015   Overview:  Chart Review:.  03/13/15- He underwent an exploratory laparotomy with all examined abdominal contents appearing normal including the entirety of the small bowel, appendix and colon, with no evidence of internal hernia. 03/17/15- CT: Segmental wall thickening in the proximal small bowel with mild dilatation, possibly enteritis, less likely obstruction. Correlate with recent laparoscopy.Smal  . 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 Anxiety. - Will continue to follow.  . Dental infection 02/26/2014  . Dyspepsia 01/15/2014   Last Assessment & Plan:  - Chronic, stable. - Refilled Pepcid.  . Elevated creatine kinase 07/17/2014   Last Assessment & Plan:  - Trending up 216 --> 330 --> 407 --> 295. 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  Other differential includes inflammatory  myopathy (dermatomyositis v. Polymyositis) v. v. Medication effect.  Unlikely hypothyroidism as normal TSH. - Denies alcohol and cocaine use. - Continue to monitor.  [name] Epidermal inclusion cyst 08/13/2015   Last Assessment & Plan:  Discussed excision of the cyst in the future. Patient is in agreement.   . Erectile dysfunction 04/11/2014  . Generalized anxiety disorder 01/15/2014   Last Assessment & Plan:  - Counseled on importance of re-starting Zoloft 50 mg once daily. - Concern that patient has been presenting to the ED multiple times for array of symptoms likely related to anxiety. - Encouraged to make an appointment at [facility] if patient feels like he needs to go to ED as we have same-day appointments available.  Patient in agreement. - Close follow-up at [facility] in one week.  [name] GERD (gastroesophageal reflux disease)   . Hemorrhoids 04/05/2014  . Hypertension   . Left inguinal hernia 03/05/2014  . Nonintractable episodic headache 07/17/2014   Last Assessment & Plan:  - Concern that patient has been presenting to the ED with 7 visits since 3/2 for similar symptoms.  Has had multiple rounds of antibiotics.  See chart review in subjective section. - Encouraged to make an appointment at [facility] if patient feels like he needs to go to ED as we have same-day appointments available.  Patient in agreement. - Recommend continuing Flonase and using   . Screening for diabetes mellitus 01/15/2014  . Tobacco use 09/16/2014  . Toe pain, right 04/05/2014  . Tonsillitis 02/12/2014    Patient Active Problem List   Diagnosis Date Noted  . Hypertension 04/26/2016  . Chronic  abdominal pain 08/13/2015  . Epidermal inclusion cyst 08/13/2015  . Tobacco use 09/16/2014  . 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 Elevated creatine kinase 07/17/2014  . Nonintractable episodic headache 07/17/2014  . Erectile dysfunction 04/11/2014  . Hemorrhoids 04/05/2014  . Toe pain, right 04/05/2014  . Bilateral low back pain without sciatica  03/05/2014  . Left inguinal hernia 03/05/2014  . Bladder wall thickening 02/26/2014  . Dental infection 02/26/2014  . Tonsillitis 02/12/2014  . Dyspepsia 01/15/2014  . Generalized anxiety disorder 01/15/2014  . GERD (gastroesophageal reflux disease) 01/15/2014  . Screening for diabetes mellitus 01/15/2014    Past Surgical History:  Procedure Laterality Date  . COLONOSCOPY    . COLONOSCOPY W/ POLYPECTOMY  02/07/2015  . head surgery    . head surgery    . LAPAROSCOPY ABDOMEN DIAGNOSTIC  03/13/2015  . UPPER GASTROINTESTINAL ENDOSCOPY  04/29/2014    Prior to Admission medications   Medication Sig Start Date End Date Taking? Authorizing Provider  albuterol (PROVENTIL HFA;VENTOLIN HFA) 108 (90 Base) MCG/ACT inhaler Inhale 2 puffs into the lungs every 4 (four) hours as needed for wheezing or shortness of breath. 08/06/15   Irean Hong, MD  albuterol (PROVENTIL HFA;VENTOLIN HFA) 108 (90 Base) MCG/ACT inhaler Inhale 2 puffs into the lungs every 6 (six) hours as needed for wheezing or shortness of breath. 08/20/16   Darci Current, MD  amLODipine (NORVASC) 10 MG tablet Take 1 tablet (10 mg total) by mouth daily. 09/08/16   Jeanmarie Plant, MD  antipyrine-benzocaine Lyla Son) OTIC solution Place 3-4 drops into the right ear every 2 (two) hours as needed for ear pain. 09/13/16   Loleta Rose, MD  benzonatate (TESSALON PERLES) 100 MG capsule Take 1 capsule (100 mg total) by mouth every 6 (six) hours as needed for cough. 04/28/16   Rebecka Apley, MD  benzonatate (TESSALON PERLES) 100 MG capsule Take 1 capsule (100 mg total) by mouth 3 (three) times daily as needed for cough (Take 1-2 per dose). 08/20/16   Darci Current, MD  butalbital-acetaminophen-caffeine (FIORICET, ESGIC) [phone] MG tablet Take 1-2 tablets by mouth every 6 (six) hours as needed for headache. 02/25/16 02/24/17  Rebecka Apley, MD  chlorpheniramine-HYDROcodone [facility] PENNKINETIC ER) 10-8 MG/5ML SUER Take 5 mLs by  mouth 2 (two) times daily. 08/06/15   Irean Hong, MD  cyclobenzaprine (FLEXERIL) 10 MG tablet Take by mouth. 09/04/14   [provider]  cyclobenzaprine (FLEXERIL) 10 MG tablet Take 1 tablet (10 mg total) by mouth 3 (three) times daily as needed. 06/12/16   Darci Current, MD  diazepam (VALIUM) 5 MG tablet Take 1 tablet (5 mg total) by mouth every 8 (eight) hours as needed for anxiety. 06/11/16   Irean Hong, MD  dicyclomine (BENTYL) 10 MG capsule Take 1 capsule (10 mg total) by mouth 4 (four) times daily -  before meals and at bedtime. 05/18/16   Wyline Mood, MD  fluticasone [facility]) 50 MCG/ACT nasal spray Place 2 sprays into both nostrils daily. 02/23/16   Menshew, Charlesetta Ivory, PA-C  hydrochlorothiazide (HYDRODIURIL) 25 MG tablet Take 1 tablet (25 mg total) by mouth daily. 09/08/16   Jeanmarie Plant, MD  lidocaine (LIDODERM) 5 % Place 1 patch onto the skin daily. Remove & Discard patch within 12 hours or as directed by MD 06/11/16   Irean Hong, MD  omeprazole (PRILOSEC) 40 MG capsule Take 1 capsule (40 mg total) by mouth daily. 09/08/16 09/08/17  Jeanmarie Plant,  MD  pantoprazole (PROTONIX) 40 MG tablet Take 1 tablet (40 mg total) by mouth daily. 03/20/16 03/20/17  Darci Current, MD  predniSONE (DELTASONE) 20 MG tablet 3 tablets daily 4 days 08/06/15   Irean Hong, MD  predniSONE (DELTASONE) 20 MG tablet Take 3 tablets (60 mg total) by mouth daily. 04/28/16   Rebecka Apley, MD  sertraline (ZOLOFT) 50 MG tablet Take 50 mg by mouth. 09/22/15   [provider]  sildenafil (REVATIO) 20 MG tablet Take by mouth. 04/11/14   [provider]  sucralfate (CARAFATE) 1 g tablet Take 1 tablet (1 g total) by mouth 2 (two) times daily. 02/25/16   Rebecka Apley, MD  sucralfate (CARAFATE) 1 g tablet Take 1 tablet (1 g total) by mouth 2 (two) times daily. 07/10/16   Rebecka Apley, MD  VIRTUSSIN A/C 100-10 MG/5ML syrup  05/05/16   [provider]     Allergies Other  Family History  Problem Relation Age of Onset  . Diabetes Mother   . Multiple sclerosis Brother   . Breast cancer Maternal Aunt   . Lung cancer Maternal Grandfather   . Stomach cancer Paternal Grandmother   . Colon cancer Maternal Uncle     Social History Social History  Substance Use Topics  . Smoking status: Current Every Day Smoker    Packs/day: 0.25    Years: 10.00    Types: Cigarettes  . Smokeless tobacco: Never Used  . Alcohol use Yes    Review of Systems Constitutional: No fever/chills Eyes: No visual changes. ENT: No sore throat. Cardiovascular: Denies chest pain. Respiratory: Denies shortness of breath. Gastrointestinal: Positive for epigastric abdominal pain.  No nausea, no vomiting.  No diarrhea.  No constipation. Genitourinary: Negative for dysuria. Musculoskeletal: Negative for neck pain.  Negative for back pain. Integumentary: Negative for rash. Neurological: Negative for headaches, focal weakness or numbness.   ____________________________________________   PHYSICAL EXAM:  VITAL SIGNS: ED Triage Vitals  Enc Vitals Group     BP 10/21/16 0238 135/72     Pulse Rate 10/21/16 0238 88     Resp 10/21/16 0238 18     Temp --      Temp Source 10/21/16 0238 Oral     SpO2 10/21/16 0238 96 %     Weight 10/21/16 0239 96.6 kg (213 lb)     Height 10/21/16 0239 1.753 m (5\' 9" )     Head Circumference --      Peak Flow --      Pain Score 10/21/16 0238 8     Pain Loc --      Pain Edu? --      Excl. in GC? --     Constitutional: Alert and oriented. Well appearing and in no acute distress. Eyes: Conjunctivae are normal.  Head: Atraumatic. Mouth/Throat: Mucous membranes are moist. Oropharynx non-erythematous. Neck: No stridor.   Cardiovascular: Normal rate, regular rhythm. Good peripheral circulation. Grossly normal heart sounds. Respiratory: Normal respiratory effort.  No retractions. Lungs CTAB. Gastrointestinal: Soft and nontender.  No distention.  Musculoskeletal: No lower extremity tenderness nor edema. No gross deformities of extremities. Neurologic:  Normal speech and language. No gross focal neurologic deficits are appreciated.  Skin:  Skin is warm, dry and intact. No rash noted. Psychiatric: Mood and affect are normal. Speech and behavior are normal.  ____________________________________________   LABS (all labs ordered are listed, but only abnormal results are displayed)  Labs Reviewed  BASIC METABOLIC PANEL - Abnormal;  Notable for the following:       Result Value   Potassium 3.4 (*)    Glucose, Bld 160 (*)    Creatinine, Ser 1.27 (*)    Calcium 8.6 (*)    All other components within normal limits  CBC - Abnormal; Notable for the following:    RDW 15.0 (*)    All other components within normal limits  TROPONIN I  LIPASE, BLOOD   ____________________________________________  EKG  ED ECG REPORT I, [place] N Abreanna Drawdy, the attending physician, personally viewed and interpreted this ECG.   Date: 10/21/2016  EKG Time: 2:31 AM  Rate: 87  Rhythm: Normal sinus rhythm  Axis: Normal  Intervals:Normal  ST&T Change: None  ____________________________________________  RADIOLOGY I, [place] N Basma Buchner, personally viewed and evaluated these images (plain radiographs) as part of my medical decision making, as well as reviewing the written report by the radiologist.  Dg Chest 2 View  Result Date: 10/21/2016 CLINICAL DATA:  Chest pain EXAM: CHEST  2 VIEW COMPARISON:  09/15/2016 FINDINGS: The heart size and mediastinal contours are within normal limits. Both lungs are clear. The visualized skeletal structures are unremarkable. IMPRESSION: No active cardiopulmonary disease. Electronically Signed   By: Jasmine PangKim  Fujinaga M.D.   On: 10/21/2016 02:59      Procedures   ____________________________________________   INITIAL IMPRESSION / ASSESSMENT AND PLAN / ED COURSE  Pertinent labs & imaging results that were  available during my care of the patient were reviewed by me and considered in my medical decision making (see chart for details).  56 year old male presenting to the emergency department with abdominal discomfort. No abdominal pain elicited on physical exam laboratory data unremarkable. Patient given a GI cocktail resolution of pain.      ____________________________________________  FINAL CLINICAL IMPRESSION(S) / ED DIAGNOSES  Final diagnoses:  Epigastric pain     MEDICATIONS GIVEN DURING THIS VISIT:  Medications  gi cocktail (Maalox,Lidocaine,Donnatal) (30 mLs Oral Given 10/21/16 0526)     NEW OUTPATIENT MEDICATIONS STARTED DURING THIS VISIT:  New Prescriptions   No medications on file    Modified Medications   No medications on file    Discontinued Medications   No medications on file     Note:  This document was prepared using Dragon voice recognition software and may include unintentional dictation errors.    Darci CurrentBrown, [place] N, MD 10/21/16 [phone]

## 2016-12-11 ENCOUNTER — Emergency Department
Admission: EM | Admit: 2016-12-11 | Discharge: 2016-12-11 | Disposition: A | Discharge status: Home / Self Care | Payer: BLUE CROSS/BLUE SHIELD | Attending: Emergency Medicine | Admitting: Emergency Medicine

## 2016-12-11 DIAGNOSIS — Z5321 Procedure and treatment not carried out due to patient leaving prior to being seen by health care provider: Secondary | ICD-10-CM | POA: Insufficient documentation

## 2016-12-11 DIAGNOSIS — R0981 Nasal congestion: Secondary | ICD-10-CM | POA: Insufficient documentation

## 2016-12-11 NOTE — ED Triage Notes (Addendum)
Pt asleep upon entering triage room; Pt reports sinus congestion and nonproductive cough for 1 week; using inhaler with little to no relief; pt in no acute distress; talking in complete coherent sentences;

## 2016-12-11 NOTE — ED Notes (Signed)
Called for room x1, no answer. 

## 2016-12-12 ENCOUNTER — Emergency Department
Admission: EM | Admit: 2016-12-12 | Discharge: 2016-12-12 | Disposition: A | Discharge status: Home / Self Care | Payer: BLUE CROSS/BLUE SHIELD | Attending: Emergency Medicine | Admitting: Emergency Medicine

## 2016-12-12 ENCOUNTER — Emergency Department: Payer: BLUE CROSS/BLUE SHIELD

## 2016-12-12 ENCOUNTER — Encounter: Payer: Self-pay | Admitting: Emergency Medicine

## 2016-12-12 DIAGNOSIS — F17210 Nicotine dependence, cigarettes, uncomplicated: Secondary | ICD-10-CM | POA: Diagnosis not present

## 2016-12-12 DIAGNOSIS — J209 Acute bronchitis, unspecified: Secondary | ICD-10-CM | POA: Insufficient documentation

## 2016-12-12 DIAGNOSIS — I10 Essential (primary) hypertension: Secondary | ICD-10-CM | POA: Diagnosis not present

## 2016-12-12 DIAGNOSIS — Z79899 Other long term (current) drug therapy: Secondary | ICD-10-CM | POA: Insufficient documentation

## 2016-12-12 DIAGNOSIS — R0981 Nasal congestion: Secondary | ICD-10-CM | POA: Diagnosis present

## 2016-12-12 LAB — COMPREHENSIVE METABOLIC PANEL
ALK PHOS: 42 U/L (ref 38–126)
ALT: 27 U/L (ref 17–63)
AST: 27 U/L (ref 15–41)
Albumin: 3.7 g/dL (ref 3.5–5.0)
Anion gap: 8 (ref 5–15)
BUN: 19 mg/dL (ref 6–20)
CHLORIDE: 105 mmol/L (ref 101–111)
CO2: 26 mmol/L (ref 22–32)
CREATININE: 1.1 mg/dL (ref 0.61–1.24)
Calcium: 8.5 mg/dL — ABNORMAL LOW (ref 8.9–10.3)
GFR calc Af Amer: >60 mL/min (ref >60)
GFR calc non Af Amer: >60 mL/min (ref >60)
GLUCOSE: 121 mg/dL — AB (ref 65–99)
Potassium: 3.5 mmol/L (ref 3.5–5.1)
SODIUM: 139 mmol/L (ref 135–145)
Total Bilirubin: 0.7 mg/dL (ref 0.3–1.2)
Total Protein: 6.5 g/dL (ref 6.5–8.1)

## 2016-12-12 LAB — CBC WITH DIFFERENTIAL/PLATELET
BASOS ABS: 0 10*3/uL (ref 0–0.1)
Basophils Relative: 1 %
EOS ABS: 0.1 10*3/uL (ref 0–0.7)
EOS PCT: 2 %
HCT: 45.5 % (ref 40.0–52.0)
HEMOGLOBIN: 15.7 g/dL (ref 13.0–18.0)
LYMPHS PCT: 39 %
Lymphs Abs: 1.9 10*3/uL (ref 1.0–3.6)
MCH: 30.7 pg (ref 26.0–34.0)
MCHC: 34.6 g/dL (ref 32.0–36.0)
MCV: 88.9 fL (ref 80.0–100.0)
Monocytes Absolute: 0.6 10*3/uL (ref 0.2–1.0)
Monocytes Relative: 13 %
NEUTROS PCT: 45 %
Neutro Abs: 2.2 10*3/uL (ref 1.4–6.5)
PLATELETS: 214 10*3/uL (ref 150–440)
RBC: 5.12 MIL/uL (ref 4.40–5.90)
RDW: 14.5 % (ref 11.5–14.5)
WBC: 5 10*3/uL (ref 3.8–10.6)

## 2016-12-12 LAB — TROPONIN I

## 2016-12-12 LAB — LIPASE, BLOOD: Lipase: 22 U/L (ref 11–51)

## 2016-12-12 MED ORDER — PREDNISONE 20 MG PO TABS
50.0000 mg | ORAL_TABLET | Freq: Once | ORAL | Status: AC
  Administered 2016-12-12: 50 mg via ORAL
  Filled 2016-12-12: qty 2

## 2016-12-12 MED ORDER — PREDNISONE 10 MG PO TABS: 50.0000 mg | ORAL_TABLET | Freq: Every day | ORAL | 0 refills | Status: DC

## 2016-12-12 MED ORDER — ALBUTEROL SULFATE HFA 108 (90 BASE) MCG/ACT IN AERS: 2.0000 | INHALATION_SPRAY | Freq: Four times a day (QID) | RESPIRATORY_TRACT | 0 refills | Status: AC | PRN

## 2016-12-12 MED ORDER — AZITHROMYCIN 500 MG PO TABS
500.0000 mg | ORAL_TABLET | Freq: Once | ORAL | Status: AC
  Administered 2016-12-12: 500 mg via ORAL
  Filled 2016-12-12: qty 1

## 2016-12-12 MED ORDER — AZITHROMYCIN 250 MG PO TABS: ORAL_TABLET | ORAL | 0 refills | Status: DC

## 2016-12-12 MED ORDER — BENZONATATE 100 MG PO CAPS
100.0000 mg | ORAL_CAPSULE | Freq: Once | ORAL | Status: AC
  Administered 2016-12-12: 100 mg via ORAL
  Filled 2016-12-12: qty 1

## 2016-12-12 MED ORDER — BENZONATATE 100 MG PO CAPS: 100.0000 mg | ORAL_CAPSULE | Freq: Three times a day (TID) | ORAL | 0 refills | Status: DC | PRN

## 2016-12-12 MED ORDER — IPRATROPIUM-ALBUTEROL 0.5-2.5 (3) MG/3ML IN SOLN
3.0000 mL | Freq: Once | RESPIRATORY_TRACT | Status: AC
  Administered 2016-12-12: 3 mL via RESPIRATORY_TRACT
  Filled 2016-12-12: qty 3

## 2016-12-12 NOTE — ED Triage Notes (Signed)
Patient with complaint of cough, congestion and sinus drainage times one week.

## 2016-12-12 NOTE — ED Provider Notes (Signed)
[facility] Emergency Department Provider Note ____________________________________________   I have reviewed the triage vital signs and the triage nursing note.  HISTORY  Chief Complaint Nasal Congestion and Cough   Historian Patient  HPI Douglas Mcdaniel is a 56 y.o. male smoker presents with one week of wheezing and trouble breathing, cough (nonproductive), without fevers, positive for epigastric burning up into the chest.  No known CAD.  No prior hx of asthma or copd but reports tried albuterol inhaler he had from a prior episode of bronchitis.  Dyspnea is moderate.  Chest discomfort is moderate.  Coughing feels worse. Nothing makes it better.    Past Medical History:  Diagnosis Date  . Bilateral low back pain without sciatica 03/05/2014  . Bladder wall thickening 02/26/2014   Last Assessment & Plan:  - CT with bladder wall thickening possibly related to infection v. Neoplasm. - UA and urine cytology pending. - Urology referral for cystoscopy.  . Chronic abdominal pain 08/13/2015   Overview:  Chart Review:.  03/13/15- He underwent an exploratory laparotomy with all examined abdominal contents appearing normal including the entirety of the small bowel, appendix and colon, with no evidence of internal hernia. 03/17/15- CT: Segmental wall thickening in the proximal small bowel with mild dilatation, possibly enteritis, less likely obstruction. Correlate with recent laparoscopy.Smal  . 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 Anxiety. - Will continue to follow.  . Dental infection 02/26/2014  . Dyspepsia 01/15/2014   Last Assessment & Plan:  - Chronic, stable. - Refilled Pepcid.  . Elevated creatine kinase 07/17/2014   Last Assessment & Plan:  - Trending up 216 --> 330 --> 407 --> 295. 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  Other differential includes inflammatory myopathy  (dermatomyositis v. Polymyositis) v. v. Medication effect.  Unlikely hypothyroidism as normal TSH. - Denies alcohol and cocaine use. - Continue to monitor.  [name] Epidermal inclusion cyst 08/13/2015   Last Assessment & Plan:  Discussed excision of the cyst in the future. Patient is in agreement.   . Erectile dysfunction 04/11/2014  . Generalized anxiety disorder 01/15/2014   Last Assessment & Plan:  - Counseled on importance of re-starting Zoloft 50 mg once daily. - Concern that patient has been presenting to the ED multiple times for array of symptoms likely related to anxiety. - Encouraged to make an appointment at [facility] if patient feels like he needs to go to ED as we have same-day appointments available.  Patient in agreement. - Close follow-up at [facility] in one week.  [name] GERD (gastroesophageal reflux disease)   . Hemorrhoids 04/05/2014  . Hypertension   . Left inguinal hernia 03/05/2014  . Nonintractable episodic headache 07/17/2014   Last Assessment & Plan:  - Concern that patient has been presenting to the ED with 7 visits since 3/2 for similar symptoms.  Has had multiple rounds of antibiotics.  See chart review in subjective section. - Encouraged to make an appointment at [facility] if patient feels like he needs to go to ED as we have same-day appointments available.  Patient in agreement. - Recommend continuing Flonase and using   . Screening for diabetes mellitus 01/15/2014  . Tobacco use 09/16/2014  . Toe pain, right 04/05/2014  . Tonsillitis 02/12/2014    Patient Active Problem List   Diagnosis Date Noted  . Hypertension 04/26/2016  . Chronic abdominal pain 08/13/2015  . Epidermal inclusion  cyst 08/13/2015  . Tobacco use 09/16/2014  . 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 Elevated creatine kinase 07/17/2014  . Nonintractable episodic headache 07/17/2014  . Erectile dysfunction 04/11/2014  . Hemorrhoids 04/05/2014  . Toe pain, right 04/05/2014  . Bilateral low back pain without sciatica 03/05/2014  .  Left inguinal hernia 03/05/2014  . Bladder wall thickening 02/26/2014  . Dental infection 02/26/2014  . Tonsillitis 02/12/2014  . Dyspepsia 01/15/2014  . Generalized anxiety disorder 01/15/2014  . GERD (gastroesophageal reflux disease) 01/15/2014  . Screening for diabetes mellitus 01/15/2014    Past Surgical History:  Procedure Laterality Date  . COLONOSCOPY    . COLONOSCOPY W/ POLYPECTOMY  02/07/2015  . head surgery    . head surgery    . LAPAROSCOPY ABDOMEN DIAGNOSTIC  03/13/2015  . UPPER GASTROINTESTINAL ENDOSCOPY  04/29/2014    Prior to Admission medications   Medication Sig Start Date End Date Taking? Authorizing Provider  albuterol (PROVENTIL HFA;VENTOLIN HFA) 108 (90 Base) MCG/ACT inhaler Inhale 2 puffs into the lungs every 6 (six) hours as needed for wheezing or shortness of breath. 08/20/16   Darci Current, MD  albuterol (PROVENTIL HFA;VENTOLIN HFA) 108 (90 Base) MCG/ACT inhaler Inhale 2 puffs into the lungs every 6 (six) hours as needed for wheezing or shortness of breath. 12/12/16   Governor Rooks, MD  amLODipine (NORVASC) 10 MG tablet Take 1 tablet (10 mg total) by mouth daily. 09/08/16   Jeanmarie Plant, MD  azithromycin (ZITHROMAX) 250 MG tablet One tab for 4 more days 12/12/16   Governor Rooks, MD  benzonatate (TESSALON PERLES) 100 MG capsule Take 1 capsule (100 mg total) by mouth 3 (three) times daily as needed for cough (Take 1-2 per dose). 08/20/16   Darci Current, MD  benzonatate (TESSALON PERLES) 100 MG capsule Take 1 capsule (100 mg total) by mouth 3 (three) times daily as needed for cough. 12/12/16   Governor Rooks, MD  fluticasone (FLONASE) 50 MCG/ACT nasal spray Place 2 sprays into both nostrils daily. 02/23/16   Menshew, Charlesetta Ivory, PA-C  hydrochlorothiazide (HYDRODIURIL) 25 MG tablet Take 1 tablet (25 mg total) by mouth daily. 09/08/16   Jeanmarie Plant, MD  omeprazole (PRILOSEC) 40 MG capsule Take 1 capsule (40 mg total) by mouth daily. 09/08/16 09/08/17   Jeanmarie Plant, MD  pantoprazole (PROTONIX) 40 MG tablet Take 1 tablet (40 mg total) by mouth daily. 03/20/16 03/20/17  Darci Current, MD  predniSONE (DELTASONE) 10 MG tablet Take 5 tablets (50 mg total) by mouth daily. 12/12/16   Governor Rooks, MD    Allergies  Allergen Reactions  . Other Rash    Tomatoes-hives    Family History  Problem Relation Age of Onset  . Diabetes Mother   . Multiple sclerosis Brother   . Breast cancer Maternal Aunt   . Lung cancer Maternal Grandfather   . Stomach cancer Paternal Grandmother   . Colon cancer Maternal Uncle     Social History Social History  Substance Use Topics  . Smoking status: Current Every Day Smoker    Packs/day: 0.25    Years: 10.00    Types: Cigarettes  . Smokeless tobacco: Never Used  . Alcohol use Yes    Review of Systems  Constitutional: Negative for fever. Eyes: Negative for visual changes. ENT: Negative for sore throat. Cardiovascular: Positive for lower chest/epigastric intermittent burning.  Respiratory: Positive for shortness of breath. Gastrointestinal: Negative for vomiting and diarrhea. Genitourinary: Negative for dysuria. Musculoskeletal: Negative for  back pain. Skin: Negative for rash. Neurological: Negative for headache.  ____________________________________________   PHYSICAL EXAM:  VITAL SIGNS: ED Triage Vitals [12/12/16 0442]  Enc Vitals Group     BP 129/84     Pulse Rate 88     Resp 18     Temp 97.7 F (36.5 C)     Temp Source Oral     SpO2 97 %     Weight 210 lb (95.3 kg)     Height  (1.753 m)     Head Circumference      Peak Flow      Pain Score 10     Pain Loc      Pain Edu?      Excl. in GC?      Constitutional: Alert and oriented. Well appearing and in no distress. HEENT   Head: Normocephalic and atraumatic.      Eyes: Conjunctivae are normal. Pupils equal and round.       Ears:         Nose: No congestion/rhinnorhea.   Mouth/Throat: Mucous membranes are  moist. No posterior pharynx erythema.   Neck: No stridor. Cardiovascular/Chest: Normal rate, regular rhythm.  No murmurs, rubs, or gallops. Respiratory: Tight breath sounds with wheezing throughout all fields, worse with attempts at deep breaths.  No ronchi or rales. Gastrointestinal: Soft. No distention, no guarding, no rebound. Nontender.    Genitourinary/rectal:Deferred Musculoskeletal: Nontender with normal range of motion in all extremities. No joint effusions.  No lower extremity tenderness.  No edema. Neurologic:  Normal speech and language. No gross or focal neurologic deficits are appreciated. Skin:  Skin is warm, dry and intact. No rash noted. Psychiatric: Mood and affect are normal. Speech and behavior are normal. Patient exhibits appropriate insight and judgment.   ____________________________________________  LABS (pertinent positives/negatives) I, Governor Rooks, MD the attending physician have reviewed the labs noted below.  Labs Reviewed  COMPREHENSIVE METABOLIC PANEL - Abnormal; Notable for the following:       Result Value   Glucose, Bld 121 (*)    Calcium 8.5 (*)    All other components within normal limits  TROPONIN I  CBC WITH DIFFERENTIAL/PLATELET  LIPASE, BLOOD    ____________________________________________    EKG I, Governor Rooks, MD, the attending physician have personally viewed and interpreted all ECGs.  75 bpm normal sinus rhythm. Narrow QRS. Normal axis. ____________________________________________  RADIOLOGY All Xrays were viewed by me.  Imaging interpreted by Radiologist, and I, Governor Rooks, MD the attending physician have reviewed the radiologist interpretation noted below.  Chest xr 2 view:  IMPRESSION: No active cardiopulmonary disease. __________________________________________  PROCEDURES  Procedure(s) performed: None  Critical Care performed: None  ____________________________________________  No current  facility-administered medications on file prior to encounter.    Current Outpatient Prescriptions on File Prior to Encounter  Medication Sig Dispense Refill  . albuterol (PROVENTIL HFA;VENTOLIN HFA) 108 (90 Base) MCG/ACT inhaler Inhale 2 puffs into the lungs every 6 (six) hours as needed for wheezing or shortness of breath. 1 Inhaler 0  . amLODipine (NORVASC) 10 MG tablet Take 1 tablet (10 mg total) by mouth daily. 10 tablet 0  . benzonatate (TESSALON PERLES) 100 MG capsule Take 1 capsule (100 mg total) by mouth 3 (three) times daily as needed for cough (Take 1-2 per dose). 30 capsule 0  . fluticasone (FLONASE) 50 MCG/ACT nasal spray Place 2 sprays into both nostrils daily. 16 g 0  . hydrochlorothiazide (HYDRODIURIL) 25 MG tablet Take  1 tablet (25 mg total) by mouth daily. 10 tablet 0  . omeprazole (PRILOSEC) 40 MG capsule Take 1 capsule (40 mg total) by mouth daily. 30 capsule 0  . pantoprazole (PROTONIX) 40 MG tablet Take 1 tablet (40 mg total) by mouth daily. 30 tablet 1    ____________________________________________  ED COURSE / ASSESSMENT AND PLAN  Pertinent labs & imaging results that were available during my care of the patient were reviewed by me and considered in my medical decision making (see chart for details).   Suspect bronchitis, but given over 1 week of symptoms will check xray.  Given chest tightness and burning will check ecg and set of blood work.  Labs reassuring, exam reassuring.  Ok for discharge and outpatient treatment and follow up for bronchitis.  DIFFERENTIAL DIAGNOSIS: Differential includes, but is not limited to, viral syndrome, bronchitis including COPD exacerbation, pneumonia, reactive airway disease including asthma, CHF including exacerbation with or without pulmonary/interstitial edema, pneumothorax, ACS, thoracic trauma, and pulmonary embolism.  CONSULTATIONS:  None  Patient / Family / Caregiver informed of clinical course, medical decision-making  process, and agree with plan.   I discussed return precautions, follow-up instructions, and discharge instructions with patient and/or family.  Discharge Instructions : You are being treated for bronchitis with albuterol and prednisone for wheezing and azithromycin antibiotic for possible lung infection in smokers.  Return to the ER for any new or worsening condition including trouble breathing, chest pain, shortness of breath, fever, dizziness or passing out, altered mental status or any other symptoms concerning to you.  ___________________________________________   FINAL CLINICAL IMPRESSION(S) / ED DIAGNOSES   Final diagnoses:  Acute bronchitis, unspecified organism              Note: This dictation was prepared with Dragon dictation. Any transcriptional errors that result from this process are unintentional    Governor Rooks, MD 12/12/16 1021

## 2016-12-12 NOTE — Discharge Instructions (Signed)
You are being treated for bronchitis with albuterol and prednisone for wheezing and azithromycin antibiotic for possible lung infection in smokers.  Return to the ER for any new or worsening condition including trouble breathing, chest pain, shortness of breath, fever, dizziness or passing out, altered mental status or any other symptoms concerning to you.

## 2016-12-18 ENCOUNTER — Emergency Department: Payer: BLUE CROSS/BLUE SHIELD

## 2016-12-18 ENCOUNTER — Emergency Department
Admission: EM | Admit: 2016-12-18 | Discharge: 2016-12-18 | Disposition: A | Discharge status: Home / Self Care | Payer: BLUE CROSS/BLUE SHIELD | Attending: Emergency Medicine | Admitting: Emergency Medicine

## 2016-12-18 ENCOUNTER — Encounter: Payer: Self-pay | Admitting: Emergency Medicine

## 2016-12-18 DIAGNOSIS — Z79899 Other long term (current) drug therapy: Secondary | ICD-10-CM | POA: Diagnosis not present

## 2016-12-18 DIAGNOSIS — F17210 Nicotine dependence, cigarettes, uncomplicated: Secondary | ICD-10-CM | POA: Diagnosis not present

## 2016-12-18 DIAGNOSIS — R1013 Epigastric pain: Secondary | ICD-10-CM | POA: Diagnosis present

## 2016-12-18 DIAGNOSIS — G8929 Other chronic pain: Secondary | ICD-10-CM | POA: Diagnosis not present

## 2016-12-18 DIAGNOSIS — K2900 Acute gastritis without bleeding: Secondary | ICD-10-CM

## 2016-12-18 DIAGNOSIS — I10 Essential (primary) hypertension: Secondary | ICD-10-CM | POA: Diagnosis not present

## 2016-12-18 LAB — URINALYSIS, COMPLETE (UACMP) WITH MICROSCOPIC
BILIRUBIN URINE: NEGATIVE
Bacteria, UA: NONE SEEN
GLUCOSE, UA: NEGATIVE mg/dL
HGB URINE DIPSTICK: NEGATIVE
Ketones, ur: NEGATIVE mg/dL
LEUKOCYTES UA: NEGATIVE
Nitrite: NEGATIVE
PH: 7 (ref 5.0–8.0)
Protein, ur: NEGATIVE mg/dL
SPECIFIC GRAVITY, URINE: 1.024 (ref 1.005–1.030)

## 2016-12-18 LAB — LIPASE, BLOOD: Lipase: 21 U/L (ref 11–51)

## 2016-12-18 LAB — CBC
HCT: 49.7 % (ref 40.0–52.0)
Hemoglobin: 16.7 g/dL (ref 13.0–18.0)
MCH: 30 pg (ref 26.0–34.0)
MCHC: 33.6 g/dL (ref 32.0–36.0)
MCV: 89.4 fL (ref 80.0–100.0)
PLATELETS: 263 10*3/uL (ref 150–440)
RBC: 5.56 MIL/uL (ref 4.40–5.90)
RDW: 14.7 % — ABNORMAL HIGH (ref 11.5–14.5)
WBC: 7.1 10*3/uL (ref 3.8–10.6)

## 2016-12-18 LAB — COMPREHENSIVE METABOLIC PANEL
ALBUMIN: 3.8 g/dL (ref 3.5–5.0)
ALK PHOS: 39 U/L (ref 38–126)
ALT: 23 U/L (ref 17–63)
ANION GAP: 7 (ref 5–15)
AST: 25 U/L (ref 15–41)
BUN: 16 mg/dL (ref 6–20)
CALCIUM: 8.7 mg/dL — AB (ref 8.9–10.3)
CO2: 29 mmol/L (ref 22–32)
CREATININE: 1.01 mg/dL (ref 0.61–1.24)
Chloride: 104 mmol/L (ref 101–111)
GFR calc Af Amer: >60 mL/min (ref >60)
GFR calc non Af Amer: >60 mL/min (ref >60)
GLUCOSE: 96 mg/dL (ref 65–99)
Potassium: 3.4 mmol/L — ABNORMAL LOW (ref 3.5–5.1)
SODIUM: 140 mmol/L (ref 135–145)
Total Bilirubin: 0.9 mg/dL (ref 0.3–1.2)
Total Protein: 7.2 g/dL (ref 6.5–8.1)

## 2016-12-18 MED ORDER — GI COCKTAIL ~~LOC~~
30.0000 mL | Freq: Once | ORAL | Status: AC
  Administered 2016-12-18: 30 mL via ORAL
  Filled 2016-12-18: qty 30

## 2016-12-18 MED ORDER — ONDANSETRON HCL 4 MG/2ML IJ SOLN
4.0000 mg | Freq: Once | INTRAMUSCULAR | Status: AC | PRN
  Administered 2016-12-18: 4 mg via INTRAVENOUS
  Filled 2016-12-18: qty 2

## 2016-12-18 MED ORDER — SUCRALFATE 1 G PO TABS: 1.0000 g | ORAL_TABLET | Freq: Four times a day (QID) | ORAL | 0 refills | Status: DC

## 2016-12-18 NOTE — ED Notes (Signed)
Pt in u/s

## 2016-12-18 NOTE — Discharge Instructions (Addendum)
you were evaluated for upper abdominal pain, and as we discussed her exam and evaluation are overall reassuring in the emergency department today. I am most suspicious that her symptoms are coming from acid reflux/indigestion also called gastritis. We discussed adding over-the-counter Zantac 150 mg once daily for about 3 weeks to see if that helps. You also being started on Carafate.  return to the emergency department immediately for any new or worsening or uncontrolled pain, fever, black or bloody stools, vomiting blood, or any other symptoms concerning to you.

## 2016-12-18 NOTE — ED Provider Notes (Signed)
[facility] Emergency Department Provider Note ____________________________________________   I have reviewed the triage vital signs and the triage nursing note.  HISTORY  Chief Complaint Emesis and Abdominal Pain   Historian Patient  HPI Douglas Mcdaniel is a 56 y.o. male Presents for evaluation of epigastric pain that's been there a day or so and is worse when he is eating food. 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  States he's never had any trouble with his gallbladder, but sometimes the pain is worse after he eats he is wondering whether or not he might have gallbladder issues.   No fever. No black or bloody stools. No vomiting blood. Next the next time he was recently on antibiotics for bronchitis, and was questioning without symptoms may have been a side effect related to the hepatic use.  Pain is moderate to severe worst, currently mild to moderate. No lower abdominal tenderness.    Past Medical History:  Diagnosis Date  . Bilateral low back pain without sciatica 03/05/2014  . Bladder wall thickening 02/26/2014   Last Assessment & Plan:  - CT with bladder wall thickening possibly related to infection v. Neoplasm. - UA and urine cytology pending. - Urology referral for cystoscopy.  . Chronic abdominal pain 08/13/2015   Overview:  Chart Review:.  03/13/15- He underwent an exploratory laparotomy with all examined abdominal contents appearing normal including the entirety of the small bowel, appendix and colon, with no evidence of internal hernia. 03/17/15- CT: Segmental wall thickening in the proximal small bowel with mild dilatation, possibly enteritis, less likely obstruction. Correlate with recent laparoscopy.Smal  . 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 Anxiety. - Will continue to follow.  .  Dental infection 02/26/2014  . Dyspepsia 01/15/2014   Last Assessment & Plan:  - Chronic, stable. - Refilled Pepcid.  . Elevated creatine kinase 07/17/2014   Last Assessment & Plan:  - Trending up 216 --> 330 --> 407 --> 295. 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  Other differential includes inflammatory myopathy (dermatomyositis v. Polymyositis) v. v. Medication effect.  Unlikely hypothyroidism as normal TSH. - Denies alcohol and cocaine use. - Continue to monitor.  [name] Epidermal inclusion cyst 08/13/2015   Last Assessment & Plan:  Discussed excision of the cyst in the future. Patient is in agreement.   . Erectile dysfunction 04/11/2014  . Generalized anxiety disorder 01/15/2014   Last Assessment & Plan:  - Counseled on importance of re-starting Zoloft 50 mg once daily. - Concern that patient has been presenting to the ED multiple times for array of symptoms likely related to anxiety. - Encouraged to make an appointment at [facility] if patient feels like he needs to go to ED as we have same-day appointments available.  Patient in agreement. - Close follow-up at [facility] in one week.  [name] GERD (gastroesophageal reflux disease)   . Hemorrhoids 04/05/2014  . Hypertension   . Left inguinal hernia 03/05/2014  . Nonintractable episodic headache 07/17/2014   Last Assessment & Plan:  - Concern that patient has been presenting to the ED with 7 visits since 3/2 for similar symptoms.  Has had multiple rounds of antibiotics.  See chart review in subjective section. - Encouraged to make an appointment at [facility] if patient feels like he needs to go to ED  as we have same-day appointments available.  Patient in agreement. - Recommend continuing Flonase and using   . Screening for diabetes mellitus 01/15/2014  . Tobacco use 09/16/2014  . Toe pain, right 04/05/2014  . Tonsillitis 02/12/2014    Patient Active Problem List   Diagnosis Date Noted  . Hypertension 04/26/2016  . Chronic abdominal  pain 08/13/2015  . Epidermal inclusion cyst 08/13/2015  . Tobacco use 09/16/2014  . 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 Elevated creatine kinase 07/17/2014  . Nonintractable episodic headache 07/17/2014  . Erectile dysfunction 04/11/2014  . Hemorrhoids 04/05/2014  . Toe pain, right 04/05/2014  . Bilateral low back pain without sciatica 03/05/2014  . Left inguinal hernia 03/05/2014  . Bladder wall thickening 02/26/2014  . Dental infection 02/26/2014  . Tonsillitis 02/12/2014  . Dyspepsia 01/15/2014  . Generalized anxiety disorder 01/15/2014  . GERD (gastroesophageal reflux disease) 01/15/2014  . Screening for diabetes mellitus 01/15/2014    Past Surgical History:  Procedure Laterality Date  . COLONOSCOPY    . COLONOSCOPY W/ POLYPECTOMY  02/07/2015  . head surgery    . head surgery    . LAPAROSCOPY ABDOMEN DIAGNOSTIC  03/13/2015  . UPPER GASTROINTESTINAL ENDOSCOPY  04/29/2014    Prior to Admission medications   Medication Sig Start Date End Date Taking? Authorizing Provider  albuterol (PROVENTIL HFA;VENTOLIN HFA) 108 (90 Base) MCG/ACT inhaler Inhale 2 puffs into the lungs every 6 (six) hours as needed for wheezing or shortness of breath. 12/12/16  Yes Governor Rooks, MD  amLODipine (NORVASC) 10 MG tablet Take 1 tablet (10 mg total) by mouth daily. 09/08/16  Yes Jeanmarie Plant, MD  benzonatate (TESSALON PERLES) 100 MG capsule Take 1 capsule (100 mg total) by mouth 3 (three) times daily as needed for cough. 12/12/16  Yes Governor Rooks, MD  famotidine (PEPCID) 20 MG tablet Take 20 mg by mouth 2 (two) times daily. 11/18/16  Yes [provider]  fluticasone (FLONASE) 50 MCG/ACT nasal spray Place 2 sprays into both nostrils daily. 02/23/16  Yes Menshew, Charlesetta Ivory, PA-C  hydrochlorothiazide (HYDRODIURIL) 25 MG tablet Take 1 tablet (25 mg total) by mouth daily. 09/08/16  Yes Jeanmarie Plant, MD  albuterol (PROVENTIL HFA;VENTOLIN HFA) 108 (90 Base) MCG/ACT inhaler Inhale 2 puffs  into the lungs every 6 (six) hours as needed for wheezing or shortness of breath. Patient not taking: Reported on 12/18/2016 08/20/16   Darci Current, MD  azithromycin [facility]) 250 MG tablet One tab for 4 more days Patient not taking: Reported on 12/18/2016 12/12/16   Governor Rooks, MD  benzonatate (TESSALON PERLES) 100 MG capsule Take 1 capsule (100 mg total) by mouth 3 (three) times daily as needed for cough (Take 1-2 per dose). Patient not taking: Reported on 12/18/2016 08/20/16   Darci Current, MD  omeprazole (PRILOSEC) 40 MG capsule Take 1 capsule (40 mg total) by mouth daily. Patient not taking: Reported on 12/18/2016 09/08/16 09/08/17  Jeanmarie Plant, MD  pantoprazole (PROTONIX) 40 MG tablet Take 1 tablet (40 mg total) by mouth daily. Patient not taking: Reported on 12/18/2016 03/20/16 03/20/17  Darci Current, MD  predniSONE (DELTASONE) 10 MG tablet Take 5 tablets (50 mg total) by mouth daily. Patient not taking: Reported on 12/18/2016 12/12/16   Governor Rooks, MD  sucralfate (CARAFATE) 1 g tablet Take 1 tablet (1 g total) by mouth 4 (four) times daily. 12/18/16 12/18/17  Governor Rooks, MD    Allergies  Allergen Reactions  . Other Rash  Tomatoes-hives    Family History  Problem Relation Age of Onset  . Diabetes Mother   . Multiple sclerosis Brother   . Breast cancer Maternal Aunt   . Lung cancer Maternal Grandfather   . Stomach cancer Paternal Grandmother   . Colon cancer Maternal Uncle     Social History Social History  Substance Use Topics  . Smoking status: Current Every Day Smoker    Packs/day: 0.25    Years: 10.00    Types: Cigarettes  . Smokeless tobacco: Never Used  . Alcohol use Yes    Review of Systems  Constitutional: Negative for fever. Eyes: Negative for visual changes. ENT: Negative for sore throat. Cardiovascular: Negative for chest pain. Respiratory: Negative for shortness of breath. Gastrointestinal: Negative for vomiting and  diarrhea. Genitourinary: Negative for dysuria. Musculoskeletal: Negative for back pain. Skin: Negative for rash. Neurological: Negative for headache.  ____________________________________________   PHYSICAL EXAM:  VITAL SIGNS: ED Triage Vitals  Enc Vitals Group     BP 12/18/16 0742 127/78     Pulse Rate 12/18/16 0742 97     Resp 12/18/16 0742 20     Temp 12/18/16 0742 (!) 97.5 F (36.4 C)     Temp Source 12/18/16 0742 Oral     SpO2 12/18/16 0742 97 %     Weight 12/18/16 0742 210 lb (95.3 kg)     Height 12/18/16 0742  (1.753 m)     Head Circumference --      Peak Flow --      Pain Score 12/18/16 0741 10     Pain Loc --      Pain Edu? --      Excl. in GC? --      Constitutional: Alert and oriented. Well appearing and in no distress. HEENT   Head: Normocephalic and atraumatic.      Eyes: Conjunctivae are normal. Pupils equal and round.       Ears:         Nose: No congestion/rhinnorhea.   Mouth/Throat: Mucous membranes are moist.   Neck: No stridor. Cardiovascular/Chest: Normal rate, regular rhythm.  No murmurs, rubs, or gallops. Respiratory: Normal respiratory effort without tachypnea nor retractions. Breath sounds are clear and equal bilaterally. No wheezes/rales/rhonchi. Gastrointestinal: Soft. No distention, no guarding, no rebound. moderate tenderness in the epigastrium without focal right upper quadrant tenderness, but soreness of both umbilicus.  Genitourinary/rectal:Deferred Musculoskeletal: Nontender with normal range of motion in all extremities. No joint effusions.  No lower extremity tenderness.  No edema. Neurologic:  Normal speech and language. No gross or focal neurologic deficits are appreciated. Skin:  Skin is warm, dry and intact. No rash noted. Psychiatric: Mood and affect are normal. Speech and behavior are normal. Patient exhibits appropriate insight and judgment.   ____________________________________________  LABS (pertinent  positives/negatives) I, Governor Rooks, MD the attending physician have reviewed the labs noted below.  Labs Reviewed  COMPREHENSIVE METABOLIC PANEL - Abnormal; Notable for the following:       Result Value   Potassium 3.4 (*)    Calcium 8.7 (*)    All other components within normal limits  CBC - Abnormal; Notable for the following:    RDW 14.7 (*)    All other components within normal limits  URINALYSIS, COMPLETE (UACMP) WITH MICROSCOPIC - Abnormal; Notable for the following:    Color, Urine YELLOW (*)    APPearance HAZY (*)    Squamous Epithelial / LPF 0-5 (*)    All other  components within normal limits  LIPASE, BLOOD    ____________________________________________    EKG I, Governor Rooks, MD, the attending physician have personally viewed and interpreted all ECGs.  68 beats per minute. Normal sinus rhythm. Normal axis. Nonspecific T-wave with T-wave inversion inferiorly which is fairly similar to prior EKGs. ____________________________________________  RADIOLOGY All Xrays were viewed by me.  Imaging interpreted by Radiologist, and I, Governor Rooks, MD the attending physician have reviewed the radiologist interpretation noted below.  ruq [place]: IMPRESSION: Evidence of hepatic steatosis. Gallbladder and bile ducts appear normal. __________________________________________  PROCEDURES  Procedure(s) performed: None  Critical Care performed: None  ____________________________________________  No current facility-administered medications on file prior to encounter.    Current Outpatient Prescriptions on File Prior to Encounter  Medication Sig Dispense Refill  . albuterol (PROVENTIL HFA;VENTOLIN HFA) 108 (90 Base) MCG/ACT inhaler Inhale 2 puffs into the lungs every 6 (six) hours as needed for wheezing or shortness of breath. 1 Inhaler 0  . amLODipine (NORVASC) 10 MG tablet Take 1 tablet (10 mg total) by mouth daily. 10 tablet 0  . benzonatate (TESSALON PERLES) 100 MG  capsule Take 1 capsule (100 mg total) by mouth 3 (three) times daily as needed for cough. 12 capsule 0  . fluticasone (FLONASE) 50 MCG/ACT nasal spray Place 2 sprays into both nostrils daily. 16 g 0  . hydrochlorothiazide (HYDRODIURIL) 25 MG tablet Take 1 tablet (25 mg total) by mouth daily. 10 tablet 0  . albuterol (PROVENTIL HFA;VENTOLIN HFA) 108 (90 Base) MCG/ACT inhaler Inhale 2 puffs into the lungs every 6 (six) hours as needed for wheezing or shortness of breath. (Patient not taking: Reported on 12/18/2016) 1 Inhaler 0  . azithromycin (ZITHROMAX) 250 MG tablet One tab for 4 more days (Patient not taking: Reported on 12/18/2016) 4 each 0  . benzonatate (TESSALON PERLES) 100 MG capsule Take 1 capsule (100 mg total) by mouth 3 (three) times daily as needed for cough (Take 1-2 per dose). (Patient not taking: Reported on 12/18/2016) 30 capsule 0  . omeprazole (PRILOSEC) 40 MG capsule Take 1 capsule (40 mg total) by mouth daily. (Patient not taking: Reported on 12/18/2016) 30 capsule 0  . pantoprazole (PROTONIX) 40 MG tablet Take 1 tablet (40 mg total) by mouth daily. (Patient not taking: Reported on 12/18/2016) 30 tablet 1  . predniSONE (DELTASONE) 10 MG tablet Take 5 tablets (50 mg total) by mouth daily. (Patient not taking: Reported on 12/18/2016) 20 tablet 0    ____________________________________________  ED COURSE / ASSESSMENT AND PLAN  Pertinent labs & imaging results that were available during my care of the patient were reviewed by me and considered in my medical decision making (see chart for details).  symptoms seem most clinically consistent with gastritis, laboratory studies are overall reassuring. ACS seems unlikely. I am adding on an EKG.  I am checking a right upper quadrant ultrasound.  No lower abdominal tenderness.  ultrasound is reassuring.  I'm going to go ahead and discharge patient home. I am going to add on Carafate and referred to a GI doctor if not  improved.   DIFFERENTIAL DIAGNOSIS: Differential diagnosis includes, but is not limited to, biliary disease (biliary colic, acute cholecystitis, cholangitis, choledocholithiasis, etc), intrathoracic causes for epigastric abdominal pain including ACS, gastritis, duodenitis, pancreatitis, small bowel or large bowel obstruction, abdominal aortic aneurysm, hernia, and gastritis.  CONSULTATIONS:   none   Patient / Family / Caregiver informed of clinical course, medical decision-making process, and agree with plan.  I discussed return precautions, follow-up instructions, and discharge instructions with patient and/or family.  Discharge Instructions : you were evaluated for upper abdominal pain, and as we discussed her exam and evaluation are overall reassuring in the emergency department today. I am most suspicious that her symptoms are coming from acid reflux/indigestion also called gastritis. We discussed adding over-the-counter Zantac 150 mg once daily for about 3 weeks to see if that helps. You also being started on Carafate.  return to the emergency department immediately for any new or worsening or uncontrolled pain, fever, black or bloody stools, vomiting blood, or any other symptoms concerning to you.  ___________________________________________   FINAL CLINICAL IMPRESSION(S) / ED DIAGNOSES   Final diagnoses:  Acute epigastric pain  Acute gastritis without hemorrhage, unspecified gastritis type              Note: This dictation was prepared with Dragon dictation. Any transcriptional errors that result from this process are unintentional    Governor Rooks, MD 12/18/16 1357

## 2016-12-18 NOTE — ED Triage Notes (Signed)
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

## 2017-01-12 ENCOUNTER — Emergency Department
Admission: EM | Admit: 2017-01-12 | Discharge: 2017-01-12 | Disposition: A | Discharge status: Home / Self Care | Payer: BLUE CROSS/BLUE SHIELD | Attending: Emergency Medicine | Admitting: Emergency Medicine

## 2017-01-12 ENCOUNTER — Other Ambulatory Visit: Payer: Self-pay

## 2017-01-12 ENCOUNTER — Emergency Department: Payer: BLUE CROSS/BLUE SHIELD

## 2017-01-12 ENCOUNTER — Encounter: Payer: Self-pay | Admitting: Emergency Medicine

## 2017-01-12 DIAGNOSIS — Y990 Civilian activity done for income or pay: Secondary | ICD-10-CM | POA: Diagnosis not present

## 2017-01-12 DIAGNOSIS — Z79899 Other long term (current) drug therapy: Secondary | ICD-10-CM | POA: Insufficient documentation

## 2017-01-12 DIAGNOSIS — Y9389 Activity, other specified: Secondary | ICD-10-CM | POA: Insufficient documentation

## 2017-01-12 DIAGNOSIS — F17210 Nicotine dependence, cigarettes, uncomplicated: Secondary | ICD-10-CM | POA: Diagnosis not present

## 2017-01-12 DIAGNOSIS — W19XXXA Unspecified fall, initial encounter: Secondary | ICD-10-CM

## 2017-01-12 DIAGNOSIS — I10 Essential (primary) hypertension: Secondary | ICD-10-CM | POA: Insufficient documentation

## 2017-01-12 DIAGNOSIS — S42201A Unspecified fracture of upper end of right humerus, initial encounter for closed fracture: Secondary | ICD-10-CM | POA: Diagnosis not present

## 2017-01-12 DIAGNOSIS — S42401A Unspecified fracture of lower end of right humerus, initial encounter for closed fracture: Secondary | ICD-10-CM

## 2017-01-12 DIAGNOSIS — Y929 Unspecified place or not applicable: Secondary | ICD-10-CM | POA: Insufficient documentation

## 2017-01-12 DIAGNOSIS — S59901A Unspecified injury of right elbow, initial encounter: Secondary | ICD-10-CM | POA: Diagnosis present

## 2017-01-12 MED ORDER — IBUPROFEN 800 MG PO TABS: 800.0000 mg | ORAL_TABLET | Freq: Three times a day (TID) | ORAL | 0 refills | Status: DC | PRN

## 2017-01-12 MED ORDER — KETOROLAC TROMETHAMINE 30 MG/ML IJ SOLN
INTRAMUSCULAR | Status: AC
  Filled 2017-01-12: qty 1

## 2017-01-12 MED ORDER — HYDROCODONE-ACETAMINOPHEN 5-325 MG PO TABS: 1.0000 | ORAL_TABLET | Freq: Four times a day (QID) | ORAL | 0 refills | Status: AC | PRN

## 2017-01-12 MED ORDER — KETOROLAC TROMETHAMINE 30 MG/ML IJ SOLN
60.0000 mg | Freq: Once | INTRAMUSCULAR | Status: AC
  Administered 2017-01-12: 60 mg via INTRAMUSCULAR
  Filled 2017-01-12: qty 2

## 2017-01-12 NOTE — ED Notes (Signed)
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

## 2017-01-12 NOTE — ED Triage Notes (Signed)
Patient ambulatory to triage with steady gait, without difficulty or distress noted; pt reports falling at work and injuring right elbow; denies desire to file workers comp at this time

## 2017-01-12 NOTE — ED Provider Notes (Signed)
[facility] Emergency Department Provider Note   ____________________________________________   First MD Initiated Contact with Patient 01/12/17 0425     (approximate)  I have reviewed the triage vital signs and the nursing notes.   HISTORY  Chief Complaint Arm Injury    HPI Douglas Mcdaniel is a 56 y.o. male who presents to the ED from work with a chief complaint of right elbow pain. Patient reports a mechanical fall at work, striking his right elbow. Patient is right-hand dominant. Denies striking head or LOC. Denies extremity weakness, numbness or tingling. Voices no other medical complaints.   Past Medical History:  Diagnosis Date  . Bilateral low back pain without sciatica 03/05/2014  . Bladder wall thickening 02/26/2014   Last Assessment & Plan:  - CT with bladder wall thickening possibly related to infection v. Neoplasm. - UA and urine cytology pending. - Urology referral for cystoscopy.  . Chronic abdominal pain 08/13/2015   Overview:  Chart Review:.  03/13/15- He underwent an exploratory laparotomy with all examined abdominal contents appearing normal including the entirety of the small bowel, appendix and colon, with no evidence of internal hernia. 03/17/15- CT: Segmental wall thickening in the proximal small bowel with mild dilatation, possibly enteritis, less likely obstruction. Correlate with recent laparoscopy.Smal  . 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 Anxiety. - Will continue to follow.  . Dental infection 02/26/2014  . Dyspepsia 01/15/2014   Last Assessment & Plan:  - Chronic, stable. - Refilled Pepcid.  . Elevated creatine kinase 07/17/2014   Last Assessment & Plan:  - Trending up 216 --> 330 --> 407 --> 295. 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  Other differential includes inflammatory myopathy (dermatomyositis v. Polymyositis) v. v. Medication  effect.  Unlikely hypothyroidism as normal TSH. - Denies alcohol and cocaine use. - Continue to monitor.  [name] Epidermal inclusion cyst 08/13/2015   Last Assessment & Plan:  Discussed excision of the cyst in the future. Patient is in agreement.   . Erectile dysfunction 04/11/2014  . Generalized anxiety disorder 01/15/2014   Last Assessment & Plan:  - Counseled on importance of re-starting Zoloft 50 mg once daily. - Concern that patient has been presenting to the ED multiple times for array of symptoms likely related to anxiety. - Encouraged to make an appointment at [facility] if patient feels like he needs to go to ED as we have same-day appointments available.  Patient in agreement. - Close follow-up at [facility] in one week.  [name] GERD (gastroesophageal reflux disease)   . Hemorrhoids 04/05/2014  . Hypertension   . Left inguinal hernia 03/05/2014  . Nonintractable episodic headache 07/17/2014   Last Assessment & Plan:  - Concern that patient has been presenting to the ED with 7 visits since 3/2 for similar symptoms.  Has had multiple rounds of antibiotics.  See chart review in subjective section. - Encouraged to make an appointment at [facility] if patient feels like he needs to go to ED as we have same-day appointments available.  Patient in agreement. - Recommend continuing Flonase and using   . Screening for diabetes mellitus 01/15/2014  . Tobacco use 09/16/2014  . Toe pain, right 04/05/2014  . Tonsillitis 02/12/2014    Patient Active Problem List   Diagnosis Date Noted  . Hypertension 04/26/2016  . Chronic abdominal pain 08/13/2015  . Epidermal inclusion cyst 08/13/2015  . Tobacco use  09/16/2014  . 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 Elevated creatine kinase 07/17/2014  . Nonintractable episodic headache 07/17/2014  . Erectile dysfunction 04/11/2014  . Hemorrhoids 04/05/2014  . Toe pain, right 04/05/2014  . Bilateral low back pain without sciatica 03/05/2014  . Left inguinal hernia 03/05/2014  . Bladder wall  thickening 02/26/2014  . Dental infection 02/26/2014  . Tonsillitis 02/12/2014  . Dyspepsia 01/15/2014  . Generalized anxiety disorder 01/15/2014  . GERD (gastroesophageal reflux disease) 01/15/2014  . Screening for diabetes mellitus 01/15/2014    Past Surgical History:  Procedure Laterality Date  . COLONOSCOPY    . COLONOSCOPY W/ POLYPECTOMY  02/07/2015  . head surgery    . head surgery    . LAPAROSCOPY ABDOMEN DIAGNOSTIC  03/13/2015  . UPPER GASTROINTESTINAL ENDOSCOPY  04/29/2014    Prior to Admission medications   Medication Sig Start Date End Date Taking? Authorizing Provider  albuterol (PROVENTIL HFA;VENTOLIN HFA) 108 (90 Base) MCG/ACT inhaler Inhale 2 puffs into the lungs every 6 (six) hours as needed for wheezing or shortness of breath. Patient not taking: Reported on 12/18/2016 08/20/16   Darci CurrentBrown, Turner N, MD  albuterol (PROVENTIL HFA;VENTOLIN HFA) 108 (90 Base) MCG/ACT inhaler Inhale 2 puffs into the lungs every 6 (six) hours as needed for wheezing or shortness of breath. 12/12/16   Governor RooksLord, Rebecca, MD  amLODipine (NORVASC) 10 MG tablet Take 1 tablet (10 mg total) by mouth daily. 09/08/16   Jeanmarie PlantMcShane, James A, MD  azithromycin (ZITHROMAX) 250 MG tablet One tab for 4 more days Patient not taking: Reported on 12/18/2016 12/12/16   Governor RooksLord, Rebecca, MD  benzonatate (TESSALON PERLES) 100 MG capsule Take 1 capsule (100 mg total) by mouth 3 (three) times daily as needed for cough (Take 1-2 per dose). Patient not taking: Reported on 12/18/2016 08/20/16   Darci CurrentBrown, [place] N, MD  benzonatate (TESSALON PERLES) 100 MG capsule Take 1 capsule (100 mg total) by mouth 3 (three) times daily as needed for cough. 12/12/16   Governor RooksLord, Rebecca, MD  famotidine (PEPCID) 20 MG tablet Take 20 mg by mouth 2 (two) times daily. 11/18/16   [provider]  fluticasone (FLONASE) 50 MCG/ACT nasal spray Place 2 sprays into both nostrils daily. 02/23/16   Menshew, Charlesetta IvoryJenise V Bacon, PA-C  hydrochlorothiazide  (HYDRODIURIL) 25 MG tablet Take 1 tablet (25 mg total) by mouth daily. 09/08/16   Jeanmarie PlantMcShane, James A, MD  omeprazole (PRILOSEC) 40 MG capsule Take 1 capsule (40 mg total) by mouth daily. Patient not taking: Reported on 12/18/2016 09/08/16 09/08/17  Jeanmarie PlantMcShane, James A, MD  pantoprazole (PROTONIX) 40 MG tablet Take 1 tablet (40 mg total) by mouth daily. Patient not taking: Reported on 12/18/2016 03/20/16 03/20/17  Darci CurrentBrown,  N, MD  predniSONE (DELTASONE) 10 MG tablet Take 5 tablets (50 mg total) by mouth daily. Patient not taking: Reported on 12/18/2016 12/12/16   Governor RooksLord, Rebecca, MD  sucralfate (CARAFATE) 1 g tablet Take 1 tablet (1 g total) by mouth 4 (four) times daily. 12/18/16 12/18/17  Governor RooksLord, Rebecca, MD    Allergies Other  Family History  Problem Relation Age of Onset  . Diabetes Mother   . Multiple sclerosis Brother   . Breast cancer Maternal Aunt   . Lung cancer Maternal Grandfather   . Stomach cancer Paternal Grandmother   . Colon cancer Maternal Uncle     Social History Social History   Tobacco Use  . Smoking status: Current Every Day Smoker    Packs/day: 0.25    Years: 10.00  Pack years: 2.50    Types: Cigarettes  . Smokeless tobacco: Never Used  Substance Use Topics  . Alcohol use: Yes  . Drug use: No    Review of Systems  Constitutional: No fever/chills Eyes: No visual changes. ENT: No sore throat. Cardiovascular: Denies chest pain. Respiratory: Denies shortness of breath. Gastrointestinal: No abdominal pain.  No nausea, no vomiting.  No diarrhea.  No constipation. Genitourinary: Negative for dysuria. Musculoskeletal: positive for right elbow pain.Negative for back pain. Skin: Negative for rash. Neurological: Negative for headaches, focal weakness or numbness.   ____________________________________________   PHYSICAL EXAM:  VITAL SIGNS: ED Triage Vitals [01/12/17 0157]  Enc Vitals Group     BP 132/70     Pulse Rate 74     Resp 20     Temp 97.9 F  (36.6 C)     Temp Source Oral     SpO2 98 %     Weight 210 lb (95.3 kg)     Height 5\' 9"  (1.753 m)     Head Circumference      Peak Flow      Pain Score 10     Pain Loc      Pain Edu?      Excl. in GC?     Constitutional: Alert and oriented. Well appearing and in no acute distress. Eyes: Conjunctivae are normal. PERRL. EOMI. Head: Atraumatic. Nose: No congestion/rhinnorhea. Mouth/Throat: Mucous membranes are moist.  Oropharynx non-erythematous. Neck: No stridor.  No cervical spine tenderness to palpation. Cardiovascular: Normal rate, regular rhythm. Grossly normal heart sounds.  Good peripheral circulation. Respiratory: Normal respiratory effort.  No retractions. Lungs CTAB. Gastrointestinal: Soft and nontender. No distention. No abdominal bruits. No CVA tenderness. Musculoskeletal:  Right olecranon with minimal swelling; tenderness to palpation, full range of motion without pain. 2+ radial pulses. Brisk, less than 5 second capillary refill. Neurologic:  Normal speech and language. No gross focal neurologic deficits are appreciated. No gait instability. Skin:  Skin is warm, dry and intact. No rash noted. Psychiatric: Mood and affect are normal. Speech and behavior are normal.  ____________________________________________   LABS (all labs ordered are listed, but only abnormal results are displayed)  Labs Reviewed - No data to display ____________________________________________  EKG  none ____________________________________________  RADIOLOGY  Dg Elbow Complete Right  Result Date: 01/12/2017 CLINICAL DATA:  Fall with elbow pain EXAM: RIGHT ELBOW - COMPLETE 3+ VIEW COMPARISON:  None. FINDINGS: There is a triceps tendon enthesophyte arising from the olecranon. There is a lucency across the midportion of the enthesophyte. No other evidence of fracture. There is no elbow joint effusion. No dislocation. IMPRESSION: 1. Possible fracture of the triceps tendon enthesophyte at  the olecranon process of the ulna. Correlate for point tenderness. 2. Otherwise, no acute fracture or dislocation of the right elbow. Electronically Signed   By: Deatra RobinsonKevin  Herman M.D.   On: 01/12/2017 04:30    ____________________________________________   PROCEDURES  Procedure(s) performed: None  Procedures  Critical Care performed: No  ____________________________________________   INITIAL IMPRESSION / ASSESSMENT AND PLAN / ED COURSE  As part of my medical decision making, I reviewed the following data within the electronic MEDICAL RECORD NUMBER Nursing notes reviewed and incorporated, Radiograph reviewed and Notes from prior ED visits.   56 year old male who presents with right elbow pain status post fall. X-ray demonstrates possible fracture of triceps tendon enthesophye. will place a splint, sling, NSAIDs, analgesia and patient will follow-up with orthopedics closely. Strict return precautions given. Patient  verbalizes understanding and agrees with plan of care.      ____________________________________________   FINAL CLINICAL IMPRESSION(S) / ED DIAGNOSES  Final diagnoses:  Fall, initial encounter  Closed fracture dislocation of right elbow, initial encounter     ED Discharge Orders    None       Note:  This document was prepared using Dragon voice recognition software and may include unintentional dictation errors.    Irean Hong, MD 01/12/17 [phone]

## 2017-01-12 NOTE — Discharge Instructions (Signed)
1. You may take pain medicines as needed (Motrin/Norco #15). 2. Keep splint clean and dry. 3. Wear sling as needed for comfort. 4. Return to the ER for worsening symptoms, persistent vomiting, difficulty breathing or other concerns.

## 2017-01-19 ENCOUNTER — Other Ambulatory Visit: Payer: Self-pay

## 2017-01-19 ENCOUNTER — Emergency Department
Admission: EM | Admit: 2017-01-19 | Discharge: 2017-01-19 | Disposition: A | Discharge status: Home / Self Care | Payer: BLUE CROSS/BLUE SHIELD | Attending: Emergency Medicine | Admitting: Emergency Medicine

## 2017-01-19 ENCOUNTER — Encounter: Payer: Self-pay | Admitting: Emergency Medicine

## 2017-01-19 DIAGNOSIS — Z79899 Other long term (current) drug therapy: Secondary | ICD-10-CM | POA: Diagnosis not present

## 2017-01-19 DIAGNOSIS — F17210 Nicotine dependence, cigarettes, uncomplicated: Secondary | ICD-10-CM | POA: Insufficient documentation

## 2017-01-19 DIAGNOSIS — I10 Essential (primary) hypertension: Secondary | ICD-10-CM | POA: Diagnosis not present

## 2017-01-19 DIAGNOSIS — R1084 Generalized abdominal pain: Secondary | ICD-10-CM

## 2017-01-19 DIAGNOSIS — R109 Unspecified abdominal pain: Secondary | ICD-10-CM | POA: Diagnosis present

## 2017-01-19 DIAGNOSIS — G8929 Other chronic pain: Secondary | ICD-10-CM | POA: Diagnosis not present

## 2017-01-19 LAB — COMPREHENSIVE METABOLIC PANEL
ALBUMIN: 4 g/dL (ref 3.5–5.0)
ALT: 30 U/L (ref 17–63)
ANION GAP: 6 (ref 5–15)
AST: 28 U/L (ref 15–41)
Alkaline Phosphatase: 55 U/L (ref 38–126)
BUN: 16 mg/dL (ref 6–20)
CALCIUM: 8.7 mg/dL — AB (ref 8.9–10.3)
CO2: 28 mmol/L (ref 22–32)
Chloride: 106 mmol/L (ref 101–111)
Creatinine, Ser: 1.1 mg/dL (ref 0.61–1.24)
GFR calc non Af Amer: >60 mL/min (ref >60)
GLUCOSE: 173 mg/dL — AB (ref 65–99)
POTASSIUM: 4.1 mmol/L (ref 3.5–5.1)
SODIUM: 140 mmol/L (ref 135–145)
Total Bilirubin: 0.5 mg/dL (ref 0.3–1.2)
Total Protein: 7.1 g/dL (ref 6.5–8.1)

## 2017-01-19 LAB — URINALYSIS, COMPLETE (UACMP) WITH MICROSCOPIC
BACTERIA UA: NONE SEEN
BILIRUBIN URINE: NEGATIVE
Glucose, UA: 50 mg/dL — AB
HGB URINE DIPSTICK: NEGATIVE
Ketones, ur: NEGATIVE mg/dL
LEUKOCYTES UA: NEGATIVE
NITRITE: NEGATIVE
PROTEIN: NEGATIVE mg/dL
SPECIFIC GRAVITY, URINE: 1.033 — AB (ref 1.005–1.030)
pH: 5 (ref 5.0–8.0)

## 2017-01-19 LAB — CBC
HEMATOCRIT: 49 % (ref 40.0–52.0)
HEMOGLOBIN: 16.5 g/dL (ref 13.0–18.0)
MCH: 30 pg (ref 26.0–34.0)
MCHC: 33.6 g/dL (ref 32.0–36.0)
MCV: 89.2 fL (ref 80.0–100.0)
Platelets: 219 10*3/uL (ref 150–440)
RBC: 5.49 MIL/uL (ref 4.40–5.90)
RDW: 14.6 % — AB (ref 11.5–14.5)
WBC: 5.7 10*3/uL (ref 3.8–10.6)

## 2017-01-19 LAB — LIPASE, BLOOD: Lipase: 106 U/L — ABNORMAL HIGH (ref 11–51)

## 2017-01-19 MED ORDER — GI COCKTAIL ~~LOC~~
ORAL | Status: AC
  Filled 2017-01-19: qty 30

## 2017-01-19 MED ORDER — GI COCKTAIL ~~LOC~~
30.0000 mL | ORAL | Status: AC
  Administered 2017-01-19: 30 mL via ORAL

## 2017-01-19 MED ORDER — FAMOTIDINE 20 MG PO TABS
ORAL_TABLET | ORAL | Status: AC
  Filled 2017-01-19: qty 2

## 2017-01-19 MED ORDER — METOCLOPRAMIDE HCL 10 MG PO TABS: 10.0000 mg | ORAL_TABLET | Freq: Four times a day (QID) | ORAL | 0 refills | Status: AC | PRN

## 2017-01-19 MED ORDER — FAMOTIDINE 20 MG PO TABS
40.0000 mg | ORAL_TABLET | Freq: Once | ORAL | Status: AC
  Administered 2017-01-19: 40 mg via ORAL

## 2017-01-19 MED ORDER — SUCRALFATE 1 G PO TABS
1.0000 g | ORAL_TABLET | Freq: Four times a day (QID) | ORAL | 1 refills | Status: DC
Start: 2017-01-19 — End: 2019-11-15

## 2017-01-19 MED ORDER — FAMOTIDINE 20 MG PO TABS: 20.0000 mg | ORAL_TABLET | Freq: Two times a day (BID) | ORAL | 0 refills | Status: AC

## 2017-01-19 NOTE — ED Notes (Signed)

## 2017-01-19 NOTE — ED Triage Notes (Signed)
Patient to ER for c/o right lower to mid abd today. Denies any N/V/D or fevers. States his abdomen feels tight. Last BM was yesterday.

## 2017-01-19 NOTE — ED Notes (Signed)
Pt given water for PO challenge 

## 2017-01-19 NOTE — ED Notes (Signed)
No answer when called for triage 

## 2017-01-19 NOTE — ED Provider Notes (Signed)
[facility] Emergency Department Provider Note  ____________________________________________  Time seen: Approximately 5:33 AM  I have reviewed the triage vital signs and the nursing notes.   HISTORY  Chief Complaint Abdominal Pain    HPI Douglas Mcdaniel is a 56 y.o. 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 Nonradiating. No aggravating or alleviating factors. Mother moderate intensity, waxing and waning, constant. No vomiting. No fevers chills or sweats.       Past Medical History:  Diagnosis Date  . Bilateral low back pain without sciatica 03/05/2014  . Bladder wall thickening 02/26/2014   Last Assessment & Plan:  - CT with bladder wall thickening possibly related to infection v. Neoplasm. - UA and urine cytology pending. - Urology referral for cystoscopy.  . Chronic abdominal pain 08/13/2015   Overview:  Chart Review:.  03/13/15- He underwent an exploratory laparotomy with all examined abdominal contents appearing normal including the entirety of the small bowel, appendix and colon, with no evidence of internal hernia. 03/17/15- CT: Segmental wall thickening in the proximal small bowel with mild dilatation, possibly enteritis, less likely obstruction. Correlate with recent laparoscopy.Smal  . 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 Anxiety. - Will continue to follow.  . Dental infection 02/26/2014  . Dyspepsia 01/15/2014   Last Assessment & Plan:  - Chronic, stable. - Refilled Pepcid.  . Elevated creatine kinase 07/17/2014   Last Assessment & Plan:  - Trending up 216 --> 330 --> 407 --> 295. 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  Other differential includes inflammatory myopathy (dermatomyositis v. Polymyositis) v. v. Medication effect.  Unlikely hypothyroidism as normal TSH. - Denies  alcohol and cocaine use. - Continue to monitor.  [name] Epidermal inclusion cyst 08/13/2015   Last Assessment & Plan:  Discussed excision of the cyst in the future. Patient is in agreement.   . Erectile dysfunction 04/11/2014  . Generalized anxiety disorder 01/15/2014   Last Assessment & Plan:  - Counseled on importance of re-starting Zoloft 50 mg once daily. - Concern that patient has been presenting to the ED multiple times for array of symptoms likely related to anxiety. - Encouraged to make an appointment at [facility] if patient feels like he needs to go to ED as we have same-day appointments available.  Patient in agreement. - Close follow-up at [facility] in one week.  [name] GERD (gastroesophageal reflux disease)   . Hemorrhoids 04/05/2014  . Hypertension   . Left inguinal hernia 03/05/2014  . Nonintractable episodic headache 07/17/2014   Last Assessment & Plan:  - Concern that patient has been presenting to the ED with 7 visits since 3/2 for similar symptoms.  Has had multiple rounds of antibiotics.  See chart review in subjective section. - Encouraged to make an appointment at [facility] if patient feels like he needs to go to ED as we have same-day appointments available.  Patient in agreement. - Recommend continuing Flonase and using   . Screening for diabetes mellitus 01/15/2014  . Tobacco use 09/16/2014  . Toe pain, right 04/05/2014  . Tonsillitis 02/12/2014     Patient Active Problem List   Diagnosis Date Noted  . Hypertension 04/26/2016  . Chronic abdominal pain 08/13/2015  . Epidermal inclusion cyst 08/13/2015  . Tobacco use 09/16/2014  . 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 Elevated creatine kinase 07/17/2014  .  Nonintractable episodic headache 07/17/2014  . Erectile dysfunction 04/11/2014  . Hemorrhoids 04/05/2014  . Toe pain, right 04/05/2014  . Bilateral low back pain without sciatica 03/05/2014  . Left inguinal hernia 03/05/2014  . Bladder wall thickening 02/26/2014  . Dental infection 02/26/2014  .  Tonsillitis 02/12/2014  . Dyspepsia 01/15/2014  . Generalized anxiety disorder 01/15/2014  . GERD (gastroesophageal reflux disease) 01/15/2014  . Screening for diabetes mellitus 01/15/2014     Past Surgical History:  Procedure Laterality Date  . COLONOSCOPY    . COLONOSCOPY W/ POLYPECTOMY  02/07/2015  . head surgery    . head surgery    . LAPAROSCOPY ABDOMEN DIAGNOSTIC  03/13/2015  . UPPER GASTROINTESTINAL ENDOSCOPY  04/29/2014     Prior to Admission medications   Medication Sig Start Date End Date Taking? Authorizing Provider  albuterol (PROVENTIL HFA;VENTOLIN HFA) 108 (90 Base) MCG/ACT inhaler Inhale 2 puffs into the lungs every 6 (six) hours as needed for wheezing or shortness of breath. Patient not taking: Reported on 12/18/2016 08/20/16   Darci CurrentBrown, [place] N, MD  albuterol (PROVENTIL HFA;VENTOLIN HFA) 108 (90 Base) MCG/ACT inhaler Inhale 2 puffs into the lungs every 6 (six) hours as needed for wheezing or shortness of breath. 12/12/16   Governor RooksLord, Rebecca, MD  amLODipine (NORVASC) 10 MG tablet Take 1 tablet (10 mg total) by mouth daily. 09/08/16   Jeanmarie PlantMcShane, James A, MD  azithromycin (ZITHROMAX) 250 MG tablet One tab for 4 more days Patient not taking: Reported on 12/18/2016 12/12/16   Governor RooksLord, Rebecca, MD  benzonatate (TESSALON PERLES) 100 MG capsule Take 1 capsule (100 mg total) by mouth 3 (three) times daily as needed for cough (Take 1-2 per dose). Patient not taking: Reported on 12/18/2016 08/20/16   Darci CurrentBrown, [place] N, MD  benzonatate (TESSALON PERLES) 100 MG capsule Take 1 capsule (100 mg total) by mouth 3 (three) times daily as needed for cough. 12/12/16   Governor RooksLord, Rebecca, MD  famotidine (PEPCID) 20 MG tablet Take 20 mg by mouth 2 (two) times daily. 11/18/16   [provider]  famotidine (PEPCID) 20 MG tablet Take 1 tablet (20 mg total) 2 (two) times daily by mouth. 01/19/17   Sharman CheekStafford, Lorrain Rivers, MD  fluticasone [facility](FLONASE) 50 MCG/ACT nasal spray Place 2 sprays into both nostrils daily.  02/23/16   Menshew, Charlesetta IvoryJenise V Bacon, PA-C  hydrochlorothiazide (HYDRODIURIL) 25 MG tablet Take 1 tablet (25 mg total) by mouth daily. 09/08/16   Jeanmarie PlantMcShane, James A, MD  HYDROcodone-acetaminophen (NORCO) 5-325 MG tablet Take 1 tablet every 6 (six) hours as needed by mouth for moderate pain. 01/12/17   Irean HongSung, Jade J, MD  ibuprofen (ADVIL,MOTRIN) 800 MG tablet Take 1 tablet (800 mg total) every 8 (eight) hours as needed by mouth for moderate pain. 01/12/17   Irean HongSung, Jade J, MD  metoCLOPramide (REGLAN) 10 MG tablet Take 1 tablet (10 mg total) every 6 (six) hours as needed by mouth. 01/19/17   Sharman CheekStafford, Tennile Styles, MD  omeprazole (PRILOSEC) 40 MG capsule Take 1 capsule (40 mg total) by mouth daily. Patient not taking: Reported on 12/18/2016 09/08/16 09/08/17  Jeanmarie PlantMcShane, James A, MD  pantoprazole (PROTONIX) 40 MG tablet Take 1 tablet (40 mg total) by mouth daily. Patient not taking: Reported on 12/18/2016 03/20/16 03/20/17  Darci CurrentBrown, [place] N, MD  predniSONE (DELTASONE) 10 MG tablet Take 5 tablets (50 mg total) by mouth daily. Patient not taking: Reported on 12/18/2016 12/12/16   Governor RooksLord, Rebecca, MD  sucralfate (CARAFATE) 1 g tablet Take 1 tablet (1 g total)  by mouth 4 (four) times daily. 12/18/16 12/18/17  Governor RooksLord, Rebecca, MD  sucralfate (CARAFATE) 1 g tablet Take 1 tablet (1 g total) 4 (four) times daily by mouth. 01/19/17   Sharman CheekStafford, Laurens Matheny, MD     Allergies Other   Family History  Problem Relation Age of Onset  . Diabetes Mother   . Multiple sclerosis Brother   . Breast cancer Maternal Aunt   . Lung cancer Maternal Grandfather   . Stomach cancer Paternal Grandmother   . Colon cancer Maternal Uncle     Social History Social History   Tobacco Use  . Smoking status: Current Every Day Smoker    Packs/day: 0.25    Years: 10.00    Pack years: 2.50    Types: Cigarettes  . Smokeless tobacco: Never Used  Substance Use Topics  . Alcohol use: Yes  . Drug use: No    Review of Systems  Constitutional:   No  fever or chills.  ENT:   No sore throat. No rhinorrhea. Cardiovascular:   No chest pain or syncope. Respiratory:   No dyspnea or cough. Gastrointestinal:   positive for right-sided abdominal pain as above, without vomiting and diarrhea.  Musculoskeletal:   Negative for focal pain or swelling All other systems reviewed and are negative except as documented above in ROS and HPI.  ____________________________________________   PHYSICAL EXAM:  VITAL SIGNS: ED Triage Vitals  Enc Vitals Group     BP 01/19/17 0228 (!) 149/78     Pulse Rate 01/19/17 0228 89     Resp 01/19/17 0228 18     Temp 01/19/17 0228 98.3 F (36.8 C)     Temp Source 01/19/17 0228 Oral     SpO2 01/19/17 0228 95 %     Weight 01/19/17 0229 210 lb (95.3 kg)     Height 01/19/17 0229 5\' 9"  (1.753 m)     Head Circumference --      Peak Flow --      Pain Score 01/19/17 0228 10     Pain Loc --      Pain Edu? --      Excl. in GC? --     Vital signs reviewed, nursing assessments reviewed.   Constitutional:   Alert and oriented. Well appearing and in no distress. Eyes:   No scleral icterus.  EOMI. No nystagmus. No conjunctival pallor. PERRL. ENT   Head:   Normocephalic and atraumatic.   Nose:   No congestion/rhinnorhea.    Mouth/Throat:   MMM, no pharyngeal erythema. No peritonsillar mass.    Neck:   No meningismus. Full ROM. Hematological/Lymphatic/Immunilogical:   No cervical lymphadenopathy. Cardiovascular:   RRR. Symmetric bilateral radial and DP pulses.  No murmurs.  Respiratory:   Normal respiratory effort without tachypnea/retractions. Breath sounds are clear and equal bilaterally. No wheezes/rales/rhonchi. Gastrointestinal:   Soft with mild generalized tenderness, nonfocal. Non distended. There is no CVA tenderness.  No rebound, rigidity, or guarding. Genitourinary:   deferred Musculoskeletal:   Normal range of motion in all extremities. No joint effusions.  No lower extremity tenderness.  No  edema. Neurologic:   Normal speech and language.  Motor grossly intact. No gross focal neurologic deficits are appreciated.  Skin:    Skin is warm, dry and intact. No rash noted.  No petechiae, purpura, or bullae.  ____________________________________________    LABS (pertinent positives/negatives) (all labs ordered are listed, but only abnormal results are displayed) Labs Reviewed  CBC - Abnormal; Notable for the following  components:      Result Value   RDW 14.6 (*)    All other components within normal limits  URINALYSIS, COMPLETE (UACMP) WITH MICROSCOPIC - Abnormal; Notable for the following components:   Color, Urine YELLOW (*)    APPearance CLEAR (*)    Specific Gravity, Urine 1.033 (*)    Glucose, UA 50 (*)    Squamous Epithelial / LPF 0-5 (*)    All other components within normal limits  COMPREHENSIVE METABOLIC PANEL - Abnormal; Notable for the following components:   Glucose, Bld 173 (*)    Calcium 8.7 (*)    All other components within normal limits  LIPASE, BLOOD - Abnormal; Notable for the following components:   Lipase 106 (*)    All other components within normal limits   ____________________________________________   EKG    ____________________________________________    RADIOLOGY  No results found.  ____________________________________________   PROCEDURES Procedures  ____________________________________________     CLINICAL IMPRESSION / ASSESSMENT AND PLAN / ED COURSE  Pertinent labs & imaging results that were available during my care of the patient were reviewed by me and considered in my medical decision making (see chart for details).   patient well-appearing no acute distress, sitting upright, tolerating oral intake. Vital signs unremarkable, symptoms minimal, exam benign and reassuring. Labs are unremarkable except for a slightly elevated lipase of 100, but presentation is not consistent with acute pancreatitis. Low suspicion for a  gallstone. We'll give antacids, encourage oral intake, follow up with primary care. Suspect brief foodborne illness versus viral syndrome.Considering the patient's symptoms, medical history, and physical examination today, I have low suspicion for cholecystitis or biliary pathology, pancreatitis, perforation or bowel obstruction, hernia, intra-abdominal abscess, AAA or dissection, volvulus or intussusception, mesenteric ischemia, or appendicitis.        ____________________________________________   FINAL CLINICAL IMPRESSION(S) / ED DIAGNOSES    Final diagnoses:  Generalized abdominal pain      This SmartLink is deprecated. Use AVSMEDLIST instead to display the medication list for a patient.   Portions of this note were generated with dragon dictation software. Dictation errors may occur despite best attempts at proofreading.    Sharman Cheek, MD 01/19/17 [id number]

## 2017-01-19 NOTE — ED Notes (Signed)
No emesis at this time.

## 2017-04-04 ENCOUNTER — Encounter: Payer: Self-pay | Admitting: *Deleted

## 2017-04-04 ENCOUNTER — Emergency Department: Payer: BLUE CROSS/BLUE SHIELD

## 2017-04-04 ENCOUNTER — Emergency Department
Admission: EM | Admit: 2017-04-04 | Discharge: 2017-04-04 | Disposition: A | Discharge status: Home / Self Care | Payer: BLUE CROSS/BLUE SHIELD | Attending: Emergency Medicine | Admitting: Emergency Medicine

## 2017-04-04 ENCOUNTER — Other Ambulatory Visit: Payer: Self-pay

## 2017-04-04 DIAGNOSIS — I10 Essential (primary) hypertension: Secondary | ICD-10-CM | POA: Diagnosis not present

## 2017-04-04 DIAGNOSIS — J111 Influenza due to unidentified influenza virus with other respiratory manifestations: Secondary | ICD-10-CM | POA: Insufficient documentation

## 2017-04-04 DIAGNOSIS — R109 Unspecified abdominal pain: Secondary | ICD-10-CM

## 2017-04-04 DIAGNOSIS — J069 Acute upper respiratory infection, unspecified: Secondary | ICD-10-CM

## 2017-04-04 DIAGNOSIS — Z79899 Other long term (current) drug therapy: Secondary | ICD-10-CM | POA: Insufficient documentation

## 2017-04-04 DIAGNOSIS — B9789 Other viral agents as the cause of diseases classified elsewhere: Secondary | ICD-10-CM | POA: Insufficient documentation

## 2017-04-04 DIAGNOSIS — F17210 Nicotine dependence, cigarettes, uncomplicated: Secondary | ICD-10-CM | POA: Diagnosis not present

## 2017-04-04 DIAGNOSIS — R6889 Other general symptoms and signs: Secondary | ICD-10-CM

## 2017-04-04 DIAGNOSIS — R1011 Right upper quadrant pain: Secondary | ICD-10-CM | POA: Diagnosis present

## 2017-04-04 LAB — URINALYSIS, COMPLETE (UACMP) WITH MICROSCOPIC
BACTERIA UA: NONE SEEN
Bilirubin Urine: NEGATIVE
GLUCOSE, UA: NEGATIVE mg/dL
HGB URINE DIPSTICK: NEGATIVE
KETONES UR: NEGATIVE mg/dL
Leukocytes, UA: NEGATIVE
Nitrite: NEGATIVE
PROTEIN: NEGATIVE mg/dL
Specific Gravity, Urine: 1.027 (ref 1.005–1.030)
Squamous Epithelial / LPF: NONE SEEN
pH: 5 (ref 5.0–8.0)

## 2017-04-04 LAB — INFLUENZA PANEL BY PCR (TYPE A & B)
Influenza A By PCR: NEGATIVE
Influenza B By PCR: NEGATIVE

## 2017-04-04 MED ORDER — LIDOCAINE 5 % EX PTCH: 1.0000 | MEDICATED_PATCH | CUTANEOUS | Status: DC

## 2017-04-04 MED ORDER — KETOROLAC TROMETHAMINE 60 MG/2ML IM SOLN
60.0000 mg | Freq: Once | INTRAMUSCULAR | Status: AC
  Administered 2017-04-04: 60 mg via INTRAMUSCULAR
  Filled 2017-04-04: qty 2

## 2017-04-04 NOTE — ED Triage Notes (Signed)
Pt presents w/ c/o flu-like sxs. Pr c/o shortness of breath, bilateral rib/back pain, generalized aches, coughing, R ear pain. Pt states back pain x 1 week and began having more generalized illness and pain starting last night. Pt denies n/v/d. Pt c/o chills and rigors.

## 2017-04-04 NOTE — ED Notes (Signed)
Patient transported to CT 

## 2017-04-04 NOTE — ED Provider Notes (Signed)
[facility] Emergency Department Provider Note   ____________________________________________   First MD Initiated Contact with Patient 04/04/17 [phone]     (approximate)  I have reviewed the triage vital signs and the nursing notes.   HISTORY  Chief Complaint Influenza and Back Pain    HPI Douglas Mcdaniel is a 57 y.o. male who comes into the hospital today with some back pain on the right side.  He reports this started about a week ago.  He states he feels like he has the flu.  He has been achy with hot flashes and feeling weak.  He is unsure if this is pneumonia or the flu.  He had a nonproductive cough with some shortness of breath and headache.  He has had no chest pain and he has had no nausea or vomiting.  He reports that he has been around a coworker who is also been sick which was concerning.  The patient decided to come into the hospital today for further evaluation of his symptoms.  He said he took some Tylenol for his symptoms earlier.  The patient rates his pain an 8 out of 10 in intensity.  Past Medical History:  Diagnosis Date  . Bilateral low back pain without sciatica 03/05/2014  . Bladder wall thickening 02/26/2014   Last Assessment & Plan:  - CT with bladder wall thickening possibly related to infection v. Neoplasm. - UA and urine cytology pending. - Urology referral for cystoscopy.  . Chronic abdominal pain 08/13/2015   Overview:  Chart Review:.  03/13/15- He underwent an exploratory laparotomy with all examined abdominal contents appearing normal including the entirety of the small bowel, appendix and colon, with no evidence of internal hernia. 03/17/15- CT: Segmental wall thickening in the proximal small bowel with mild dilatation, possibly enteritis, less likely obstruction. Correlate with recent laparoscopy.Smal  . 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 Anxiety. - Will continue to follow.  .  Dental infection 02/26/2014  . Dyspepsia 01/15/2014   Last Assessment & Plan:  - Chronic, stable. - Refilled Pepcid.  . Elevated creatine kinase 07/17/2014   Last Assessment & Plan:  - Trending up 216 --> 330 --> 407 --> 295. 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  Other differential includes inflammatory myopathy (dermatomyositis v. Polymyositis) v. v. Medication effect.  Unlikely hypothyroidism as normal TSH. - Denies alcohol and cocaine use. - Continue to monitor.  [name]. Epidermal inclusion cyst 08/13/2015   Last Assessment & Plan:  Discussed excision of the cyst in the future. Patient is in agreement.   . Erectile dysfunction 04/11/2014  . Generalized anxiety disorder 01/15/2014   Last Assessment & Plan:  - Counseled on importance of re-starting Zoloft 50 mg once daily. - Concern that patient has been presenting to the ED multiple times for array of symptoms likely related to anxiety. - Encouraged to [facility] if patient feels like he needs to go to ED as we have same-day appointments available.  Patient in agreement. - [facility] in one week.  [name]. GERD (gastroesophageal reflux disease)   . Hemorrhoids 04/05/2014  . Hypertension   . Left inguinal hernia 03/05/2014  . Nonintractable episodic headache 07/17/2014   Last Assessment & Plan:  - Concern that patient has been presenting to the ED with 7 visits since 3/2 for similar symptoms.  Has had multiple rounds of antibiotics.  See chart review in subjective section. - Encouraged to make an appointment at [facility] if patient feels like he needs to go to ED as we have same-day appointments available.  Patient in agreement. - Recommend continuing Flonase and using   . Screening for diabetes mellitus 01/15/2014  . Tobacco use 09/16/2014  . Toe pain, right 04/05/2014  . Tonsillitis 02/12/2014    Patient Active Problem List   Diagnosis Date Noted  . Hypertension 04/26/2016  . Chronic abdominal  pain 08/13/2015  . Epidermal inclusion cyst 08/13/2015  . Tobacco use 09/16/2014  . 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 Elevated creatine kinase 07/17/2014  . Nonintractable episodic headache 07/17/2014  . Erectile dysfunction 04/11/2014  . Hemorrhoids 04/05/2014  . Toe pain, right 04/05/2014  . Bilateral low back pain without sciatica 03/05/2014  . Left inguinal hernia 03/05/2014  . Bladder wall thickening 02/26/2014  . Dental infection 02/26/2014  . Tonsillitis 02/12/2014  . Dyspepsia 01/15/2014  . Generalized anxiety disorder 01/15/2014  . GERD (gastroesophageal reflux disease) 01/15/2014  . Screening for diabetes mellitus 01/15/2014    Past Surgical History:  Procedure Laterality Date  . COLONOSCOPY    . COLONOSCOPY W/ POLYPECTOMY  02/07/2015  . head surgery    . head surgery    . LAPAROSCOPY ABDOMEN DIAGNOSTIC  03/13/2015  . UPPER GASTROINTESTINAL ENDOSCOPY  04/29/2014    Prior to Admission medications   Medication Sig Start Date End Date Taking? Authorizing Provider  albuterol (PROVENTIL HFA;VENTOLIN HFA) 108 (90 Base) MCG/ACT inhaler Inhale 2 puffs into the lungs every 6 (six) hours as needed for wheezing or shortness of breath. Patient not taking: Reported on 12/18/2016 08/20/16   Darci Current, MD  albuterol (PROVENTIL HFA;VENTOLIN HFA) 108 (90 Base) MCG/ACT inhaler Inhale 2 puffs into the lungs every 6 (six) hours as needed for wheezing or shortness of breath. 12/12/16   Governor Rooks, MD  amLODipine (NORVASC) 10 MG tablet Take 1 tablet (10 mg total) by mouth daily. 09/08/16   Jeanmarie Plant, MD  azithromycin (ZITHROMAX) 250 MG tablet One tab for 4 more days Patient not taking: Reported on 12/18/2016 12/12/16   Governor Rooks, MD  benzonatate (TESSALON PERLES) 100 MG capsule Take 1 capsule (100 mg total) by mouth 3 (three) times daily as needed for cough (Take 1-2 per dose). Patient not taking: Reported on 12/18/2016 08/20/16   Darci Current, MD  benzonatate  (TESSALON PERLES) 100 MG capsule Take 1 capsule (100 mg total) by mouth 3 (three) times daily as needed for cough. 12/12/16   Governor Rooks, MD  famotidine (PEPCID) 20 MG tablet Take 20 mg by mouth 2 (two) times daily. 11/18/16   [provider]  famotidine (PEPCID) 20 MG tablet Take 1 tablet (20 mg total) 2 (two) times daily by mouth. 01/19/17   Sharman Cheek, MD  fluticasone [facility]) 50 MCG/ACT nasal spray Place 2 sprays into both nostrils daily. 02/23/16   Menshew, Charlesetta Ivory, PA-C  hydrochlorothiazide (HYDRODIURIL) 25 MG tablet Take 1 tablet (25 mg total) by mouth daily. 09/08/16   Jeanmarie Plant, MD  HYDROcodone-acetaminophen (NORCO) 5-325 MG tablet Take 1 tablet every 6 (six) hours as needed by mouth for moderate pain. 01/12/17   Irean Hong, MD  ibuprofen (ADVIL,MOTRIN) 800 MG tablet Take 1 tablet (800 mg total) every 8 (eight) hours as needed by mouth for moderate pain. 01/12/17   Irean Hong, MD  metoCLOPramide (REGLAN) 10 MG tablet Take 1 tablet (10 mg total) every 6 (  six) hours as needed by mouth. 01/19/17   Sharman Cheek, MD  omeprazole (PRILOSEC) 40 MG capsule Take 1 capsule (40 mg total) by mouth daily. Patient not taking: Reported on 12/18/2016 09/08/16 09/08/17  Jeanmarie Plant, MD  pantoprazole (PROTONIX) 40 MG tablet Take 1 tablet (40 mg total) by mouth daily. Patient not taking: Reported on 12/18/2016 03/20/16 03/20/17  Darci Current, MD  predniSONE (DELTASONE) 10 MG tablet Take 5 tablets (50 mg total) by mouth daily. Patient not taking: Reported on 12/18/2016 12/12/16   Governor Rooks, MD  sucralfate (CARAFATE) 1 g tablet Take 1 tablet (1 g total) by mouth 4 (four) times daily. 12/18/16 12/18/17  Governor Rooks, MD  sucralfate (CARAFATE) 1 g tablet Take 1 tablet (1 g total) 4 (four) times daily by mouth. 01/19/17   Sharman Cheek, MD    Allergies Other  Family History  Problem Relation Age of Onset  . Diabetes Mother   . Multiple sclerosis Brother   .  Breast cancer Maternal Aunt   . Lung cancer Maternal Grandfather   . Stomach cancer Paternal Grandmother   . Colon cancer Maternal Uncle     Social History Social History   Tobacco Use  . Smoking status: Current Every Day Smoker    Packs/day: 0.25    Years: 10.00    Pack years: 2.50    Types: Cigarettes  . Smokeless tobacco: Never Used  Substance Use Topics  . Alcohol use: Yes  . Drug use: No    Review of Systems  Constitutional: Hot and cold flashes Eyes: No visual changes. ENT: No sore throat. Cardiovascular: Denies chest pain. Respiratory: Cough and shortness of breath. Gastrointestinal: No abdominal pain.  No nausea, no vomiting.  No diarrhea.  No constipation. Genitourinary: Negative for dysuria. Musculoskeletal: Back pain and body aches Skin: Negative for rash. Neurological: Headache   ____________________________________________   PHYSICAL EXAM:  VITAL SIGNS: ED Triage Vitals  Enc Vitals Group     BP 04/04/17 0211 136/79     Pulse Rate 04/04/17 0211 88     Resp 04/04/17 0211 18     Temp 04/04/17 0211 97.9 F (36.6 C)     Temp Source 04/04/17 0211 Oral     SpO2 04/04/17 0211 95 %     Weight 04/04/17 0214 220 lb (99.8 kg)     Height 04/04/17 0214 5\' 9"  (1.753 m)     Head Circumference --      Peak Flow --      Pain Score 04/04/17 0214 8     Pain Loc --      Pain Edu? --      Excl. in GC? --     Constitutional: Alert and oriented. Well appearing and in moderate distress. Eyes: Conjunctivae are normal. PERRL. EOMI. Head: Atraumatic. Nose: No congestion/rhinnorhea. Mouth/Throat: Mucous membranes are moist.  Oropharynx non-erythematous. Cardiovascular: Normal rate, regular rhythm. Grossly normal heart sounds.  Good peripheral circulation. Respiratory: Normal respiratory effort.  No retractions. Lungs CTAB. Gastrointestinal: Soft and nontender. No distention.  Right sided CVA tenderness to palpation Musculoskeletal: No lower extremity tenderness nor  edema.   Neurologic:  Normal speech and language.  Skin:  Skin is warm, dry and intact.  Psychiatric: Mood and affect are normal.   ____________________________________________   LABS (all labs ordered are listed, but only abnormal results are displayed)  Labs Reviewed  URINALYSIS, COMPLETE (UACMP) WITH MICROSCOPIC - Abnormal; Notable for the following components:      Result Value  Color, Urine YELLOW (*)    APPearance CLEAR (*)    All other components within normal limits  INFLUENZA PANEL BY PCR (TYPE A & B)   ____________________________________________  EKG  none ____________________________________________  RADIOLOGY  Dg Chest 2 View  Result Date: 04/04/2017 CLINICAL DATA:  Flu-like symptoms. Shortness of breath, bilateral rib and back pain generalized aches, cough, right ear pain. EXAM: CHEST  2 VIEW COMPARISON:  12/12/2016 FINDINGS: Shallow inspiration. The heart size and mediastinal contours are within normal limits. Both lungs are clear. The visualized skeletal structures are unremarkable. IMPRESSION: No active cardiopulmonary disease. Electronically Signed   By: Burman Nieves M.D.   On: 04/04/2017 02:51   Ct Renal Stone Study  Result Date: 04/04/2017 CLINICAL DATA:  Back pain and shortness of breath.  Flank pain. EXAM: CT ABDOMEN AND PELVIS WITHOUT CONTRAST TECHNIQUE: Multidetector CT imaging of the abdomen and pelvis was performed following the standard protocol without IV contrast. COMPARISON:  CT abdomen pelvis 04/11/2016 FINDINGS: Lower chest: No basilar pulmonary nodules or pleural effusion. No apical pericardial effusion. Hepatobiliary: Diffuse hepatic steatosis with focal sparing at the gallbladder fossa. The gallbladder is decompressed. No biliary dilatation. Pancreas: Normal parenchymal contours without ductal dilatation. No peripancreatic fluid collection. Spleen: Normal. Adrenals/Urinary Tract: --Adrenal glands: Normal. --Right kidney/ureter: No  hydronephrosis, perinephric stranding or nephrolithiasis. No obstructing ureteral stones. --Left kidney/ureter: No hydronephrosis, perinephric stranding or nephrolithiasis. No obstructing ureteral stones. --Urinary bladder: Normal appearance for the degree of distention. Stomach/Bowel: --Stomach/Duodenum: No hiatal hernia or other gastric abnormality. Normal duodenal course. --Small bowel: No dilatation or inflammation. --Colon: No focal abnormality. --Appendix: Normal. Vascular/Lymphatic: Normal course and caliber of the major abdominal vessels. No abdominal or pelvic lymphadenopathy. Reproductive: No free fluid in the pelvis. Musculoskeletal. No bony spinal canal stenosis or focal osseous abnormality. Other: None. IMPRESSION: No obstructive uropathy or other acute abdominopelvic abnormality. Electronically Signed   By: Deatra Robinson M.D.   On: 04/04/2017 03:49    ____________________________________________   PROCEDURES  Procedure(s) performed: None  Procedures  Critical Care performed: No  ____________________________________________   INITIAL IMPRESSION / ASSESSMENT AND PLAN / ED COURSE  As part of my medical decision making, I reviewed the following data within the electronic MEDICAL RECORD NUMBER Notes from prior ED visits and [place] Controlled Substance Database   This is a 57 year old male who comes into the hospital today with some right-sided flank pain, body aches, cough, shortness of breath with and cold flashes.  The patient is concerned he may have the flu or pneumonia.  Differential diagnosis includes viral respiratory infection, influenza, pneumonia, kidney stone, UTI.  I did check a flu swab on the patient as well as a chest x-ray.  The patient's chest x-ray was negative and his flu swab was also negative.  When the patient was stating that he had this flank pain I also ordered a urinalysis and a renal stone protocol CT.  The patient's urinalysis was unremarkable and the patient's  CT scan did not show any kidney stones or cause of his pain.  The patient did receive a Lidoderm patch and a dose of Toradol.  I feel that the patient has a viral upper respiratory infection.  He will be discharged home to follow-up with his primary care physician for further evaluation.      ____________________________________________   FINAL CLINICAL IMPRESSION(S) / ED DIAGNOSES  Final diagnoses:  Flu-like symptoms  Viral upper respiratory tract infection  Flank pain     ED Discharge Orders  None       Note:  This document was prepared using Dragon voice recognition software and may include unintentional dictation errors.   Rebecka Apley, MD 04/04/17 [phone]

## 2017-04-04 NOTE — Discharge Instructions (Signed)
Please follow-up with the acute care clinic for further evaluation of your symptoms. °

## 2017-06-02 ENCOUNTER — Emergency Department
Admission: EM | Admit: 2017-06-02 | Discharge: 2017-06-02 | Disposition: A | Discharge status: Home / Self Care | Payer: BLUE CROSS/BLUE SHIELD | Attending: Emergency Medicine | Admitting: Emergency Medicine

## 2017-06-02 ENCOUNTER — Other Ambulatory Visit: Payer: Self-pay

## 2017-06-02 DIAGNOSIS — F17210 Nicotine dependence, cigarettes, uncomplicated: Secondary | ICD-10-CM | POA: Diagnosis not present

## 2017-06-02 DIAGNOSIS — Z79899 Other long term (current) drug therapy: Secondary | ICD-10-CM | POA: Diagnosis not present

## 2017-06-02 DIAGNOSIS — J0110 Acute frontal sinusitis, unspecified: Secondary | ICD-10-CM | POA: Insufficient documentation

## 2017-06-02 DIAGNOSIS — F411 Generalized anxiety disorder: Secondary | ICD-10-CM | POA: Insufficient documentation

## 2017-06-02 DIAGNOSIS — I10 Essential (primary) hypertension: Secondary | ICD-10-CM | POA: Diagnosis not present

## 2017-06-02 DIAGNOSIS — R0981 Nasal congestion: Secondary | ICD-10-CM | POA: Diagnosis present

## 2017-06-02 MED ORDER — AZITHROMYCIN 500 MG PO TABS
500.0000 mg | ORAL_TABLET | Freq: Once | ORAL | Status: AC
  Administered 2017-06-02: 500 mg via ORAL
  Filled 2017-06-02: qty 1

## 2017-06-02 MED ORDER — AZITHROMYCIN 250 MG PO TABS: 250.0000 mg | ORAL_TABLET | Freq: Every day | ORAL | 0 refills | Status: DC

## 2017-06-02 NOTE — Discharge Instructions (Signed)
1.  Finish antibiotic as prescribed (Azithromycin 250 mg daily x 4 days).  Take your next dose Friday morning. 2.  Return to the ER for worsening symptoms, persistent vomiting, difficulty breathing or other concerns.

## 2017-06-02 NOTE — ED Triage Notes (Signed)
Pt in with nasal congestion x 3 days with sinus pressure.

## 2017-06-02 NOTE — ED Provider Notes (Signed)
[facility] Emergency Department Provider Note   ____________________________________________   First MD Initiated Contact with Patient 06/02/17 [phone]     (approximate)  I have reviewed the triage vital signs and the nursing notes.   HISTORY  Chief Complaint Nasal Congestion    HPI Douglas Mcdaniel is a 57 y.o. male who presents to the ED from home with a chief complaint of nasal congestion, sinus pressure for the past 3 days.  Patient also complains of dry cough.  Denies associated fever, chills, chest pain, shortness of breath, abdominal pain, nausea, vomiting, diarrhea.  Denies recent travel or trauma.   Past Medical History:  Diagnosis Date  . Bilateral low back pain without sciatica 03/05/2014  . Bladder wall thickening 02/26/2014   Last Assessment & Plan:  - CT with bladder wall thickening possibly related to infection v. Neoplasm. - UA and urine cytology pending. - Urology referral for cystoscopy.  . Chronic abdominal pain 08/13/2015   Overview:  Chart Review:.  03/13/15- He underwent an exploratory laparotomy with all examined abdominal contents appearing normal including the entirety of the small bowel, appendix and colon, with no evidence of internal hernia. 03/17/15- CT: Segmental wall thickening in the proximal small bowel with mild dilatation, possibly enteritis, less likely obstruction. Correlate with recent laparoscopy.Smal  . 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 Anxiety. - Will continue to follow.  . Dental infection 02/26/2014  . Dyspepsia 01/15/2014   Last Assessment & Plan:  - Chronic, stable. - Refilled Pepcid.  . Elevated creatine kinase 07/17/2014   Last Assessment & Plan:  - Trending up 216 --> 330 --> 407 --> 295. 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  Other differential includes inflammatory myopathy (dermatomyositis v. Polymyositis) v. v.  Medication effect.  Unlikely hypothyroidism as normal TSH. - Denies alcohol and cocaine use. - Continue to monitor.  [name]. Epidermal inclusion cyst 08/13/2015   Last Assessment & Plan:  Discussed excision of the cyst in the future. Patient is in agreement.   . Erectile dysfunction 04/11/2014  . Generalized anxiety disorder 01/15/2014   Last Assessment & Plan:  - Counseled on importance of re-starting Zoloft 50 mg once daily. - Concern that patient has been presenting to the ED multiple times for array of symptoms likely related to anxiety. - [facility] if patient feels like he needs to go to ED as we have same-day appointments available.  Patient in agreement. - [facility] in one week.  [name]. GERD (gastroesophageal reflux disease)   . Hemorrhoids 04/05/2014  . Hypertension   . Left inguinal hernia 03/05/2014  . Nonintractable episodic headache 07/17/2014   Last Assessment & Plan:  - Concern that patient has been presenting to the ED with 7 visits since 3/2 for similar symptoms.  Has had multiple rounds of antibiotics.  See chart review in subjective section. - Encouraged to [facility] an appointment [facility] if patient feels like he needs to go to ED as we have same-day appointments available.  Patient in agreement. - Recommend continuing Flonase and using   . Screening for diabetes mellitus 01/15/2014  . Tobacco use 09/16/2014  . Toe pain, right 04/05/2014  . Tonsillitis 02/12/2014    Patient Active Problem List   Diagnosis Date Noted  . Hypertension 04/26/2016  . Chronic abdominal pain 08/13/2015  . Epidermal inclusion cyst 08/13/2015  .  Tobacco use 09/16/2014  . 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 Elevated creatine kinase 07/17/2014  . Nonintractable episodic headache 07/17/2014  . Erectile dysfunction 04/11/2014  . Hemorrhoids 04/05/2014  . Toe pain, right 04/05/2014  . Bilateral low back pain without sciatica 03/05/2014  . Left inguinal hernia 03/05/2014  . Bladder  wall thickening 02/26/2014  . Dental infection 02/26/2014  . Tonsillitis 02/12/2014  . Dyspepsia 01/15/2014  . Generalized anxiety disorder 01/15/2014  . GERD (gastroesophageal reflux disease) 01/15/2014  . Screening for diabetes mellitus 01/15/2014    Past Surgical History:  Procedure Laterality Date  . COLONOSCOPY    . COLONOSCOPY W/ POLYPECTOMY  02/07/2015  . head surgery    . head surgery    . LAPAROSCOPY ABDOMEN DIAGNOSTIC  03/13/2015  . UPPER GASTROINTESTINAL ENDOSCOPY  04/29/2014    Prior to Admission medications   Medication Sig Start Date End Date Taking? Authorizing Provider  albuterol (PROVENTIL HFA;VENTOLIN HFA) 108 (90 Base) MCG/ACT inhaler Inhale 2 puffs into the lungs every 6 (six) hours as needed for wheezing or shortness of breath. Patient not taking: Reported on 12/18/2016 08/20/16   Darci Current, MD  albuterol (PROVENTIL HFA;VENTOLIN HFA) 108 (90 Base) MCG/ACT inhaler Inhale 2 puffs into the lungs every 6 (six) hours as needed for wheezing or shortness of breath. 12/12/16   Governor Rooks, MD  amLODipine (NORVASC) 10 MG tablet Take 1 tablet (10 mg total) by mouth daily. 09/08/16   Jeanmarie Plant, MD  azithromycin (ZITHROMAX) 250 MG tablet Take 1 tablet (250 mg total) by mouth daily. 06/02/17   Irean Hong, MD  benzonatate (TESSALON PERLES) 100 MG capsule Take 1 capsule (100 mg total) by mouth 3 (three) times daily as needed for cough (Take 1-2 per dose). Patient not taking: Reported on 12/18/2016 08/20/16   Darci Current, MD  benzonatate (TESSALON PERLES) 100 MG capsule Take 1 capsule (100 mg total) by mouth 3 (three) times daily as needed for cough. 12/12/16   Governor Rooks, MD  famotidine (PEPCID) 20 MG tablet Take 20 mg by mouth 2 (two) times daily. 11/18/16   [provider]  famotidine (PEPCID) 20 MG tablet Take 1 tablet (20 mg total) 2 (two) times daily by mouth. 01/19/17   Sharman Cheek, MD  fluticasone [facility]) 50 MCG/ACT nasal spray Place 2  sprays into both nostrils daily. 02/23/16   Menshew, Charlesetta Ivory, PA-C  hydrochlorothiazide (HYDRODIURIL) 25 MG tablet Take 1 tablet (25 mg total) by mouth daily. 09/08/16   Jeanmarie Plant, MD  HYDROcodone-acetaminophen (NORCO) 5-325 MG tablet Take 1 tablet every 6 (six) hours as needed by mouth for moderate pain. 01/12/17   Irean Hong, MD  ibuprofen (ADVIL,MOTRIN) 800 MG tablet Take 1 tablet (800 mg total) every 8 (eight) hours as needed by mouth for moderate pain. 01/12/17   Irean Hong, MD  metoCLOPramide (REGLAN) 10 MG tablet Take 1 tablet (10 mg total) every 6 (six) hours as needed by mouth. 01/19/17   Sharman Cheek, MD  omeprazole (PRILOSEC) 40 MG capsule Take 1 capsule (40 mg total) by mouth daily. Patient not taking: Reported on 12/18/2016 09/08/16 09/08/17  Jeanmarie Plant, MD  pantoprazole (PROTONIX) 40 MG tablet Take 1 tablet (40 mg total) by mouth daily. Patient not taking: Reported on 12/18/2016 03/20/16 03/20/17  Darci Current, MD  predniSONE (DELTASONE) 10 MG tablet Take 5 tablets (50 mg total) by mouth daily. Patient not taking: Reported on 12/18/2016 12/12/16   Governor Rooks, MD  sucralfate (CARAFATE) 1 g tablet Take 1 tablet (1 g total) by mouth 4 (four) times daily. 12/18/16 12/18/17  Governor Rooks, MD  sucralfate (CARAFATE) 1 g tablet Take 1 tablet (1 g total) 4 (four) times daily by mouth. 01/19/17   Sharman Cheek, MD    Allergies Other  Family History  Problem Relation Age of Onset  . Diabetes Mother   . Multiple sclerosis Brother   . Breast cancer Maternal Aunt   . Lung cancer Maternal Grandfather   . Stomach cancer Paternal Grandmother   . Colon cancer Maternal Uncle     Social History Social History   Tobacco Use  . Smoking status: Current Every Day Smoker    Packs/day: 0.25    Years: 10.00    Pack years: 2.50    Types: Cigarettes  . Smokeless tobacco: Never Used  Substance Use Topics  . Alcohol use: Yes  . Drug use: No    Review of  Systems  Constitutional: No fever/chills. Eyes: No visual changes. ENT: Positive for sinus pressure and nasal congestion.  No sore throat. Cardiovascular: Denies chest pain. Respiratory: Positive for dry cough.  Denies shortness of breath. Gastrointestinal: No abdominal pain.  No nausea, no vomiting.  No diarrhea.  No constipation. Genitourinary: Negative for dysuria. Musculoskeletal: Negative for back pain. Skin: Negative for rash. Neurological: Negative for headaches, focal weakness or numbness.   ____________________________________________   PHYSICAL EXAM:  VITAL SIGNS: ED Triage Vitals  Enc Vitals Group     BP 06/02/17 0117 117/73     Pulse Rate 06/02/17 0117 92     Resp 06/02/17 0117 18     Temp 06/02/17 0117 98.3 F (36.8 C)     Temp Source 06/02/17 0117 Oral     SpO2 06/02/17 0117 96 %     Weight 06/02/17 0103 210 lb (95.3 kg)     Height 06/02/17 0103 5\' 9"  (1.753 m)     Head Circumference --      Peak Flow --      Pain Score 06/02/17 0103 10     Pain Loc --      Pain Edu? --      Excl. in GC? --     Constitutional: Alert and oriented. Well appearing and in no acute distress. Eyes: Conjunctivae are normal. PERRL. EOMI. Head: Atraumatic.  Frontal sinuses tender to palpation. Nose: Congestion/rhinnorhea. Mouth/Throat: Mucous membranes are moist.  Oropharynx non-erythematous.  Postnasal drip noted. Neck: No stridor.  Supple neck without meningismus. Hematological/Lymphatic/Immunilogical: No cervical lymphadenopathy. Cardiovascular: Normal rate, regular rhythm. Grossly normal heart sounds.  Good peripheral circulation. Respiratory: Normal respiratory effort.  No retractions. Lungs CTAB. Gastrointestinal: Soft and nontender. No distention. No abdominal bruits. No CVA tenderness. Musculoskeletal: No lower extremity tenderness nor edema.  No joint effusions. Neurologic:  Normal speech and language. No gross focal neurologic deficits are appreciated. No gait  instability. Skin:  Skin is warm, dry and intact. No rash noted. Psychiatric: Mood and affect are normal. Speech and behavior are normal.  ____________________________________________   LABS (all labs ordered are listed, but only abnormal results are displayed)  Labs Reviewed - No data to display ____________________________________________  EKG  None ____________________________________________  RADIOLOGY  ED MD interpretation: None  Official radiology report(s): No results found.  ____________________________________________   PROCEDURES  Procedure(s) performed: None  Procedures  Critical Care performed: No  ____________________________________________   INITIAL IMPRESSION / ASSESSMENT AND PLAN / ED COURSE  As part of my medical decision making, I  reviewed the following data within the electronic MEDICAL RECORD NUMBER Nursing notes reviewed and incorporated, Old chart reviewed and Notes from prior ED visits   57 year old male who presents with nasal congestion and sinus pressure.  Will place on Z-Pak and patient will follow up with his PCP next week.  Strict return precautions given.  Patient verbalizes understanding and agrees with plan of care.      ____________________________________________   FINAL CLINICAL IMPRESSION(S) / ED DIAGNOSES  Final diagnoses:  Acute frontal sinusitis, recurrence not specified     ED Discharge Orders        Ordered    azithromycin (ZITHROMAX) 250 MG tablet  Daily     06/02/17 0314       Note:  This document was prepared using Dragon voice recognition software and may include unintentional dictation errors.    Irean Hong, MD 06/02/17 [phone]

## 2017-06-02 NOTE — ED Notes (Signed)

## 2017-06-27 ENCOUNTER — Emergency Department
Admission: EM | Admit: 2017-06-27 | Discharge: 2017-06-27 | Disposition: A | Discharge status: Home / Self Care | Payer: BLUE CROSS/BLUE SHIELD | Attending: Emergency Medicine | Admitting: Emergency Medicine

## 2017-06-27 ENCOUNTER — Encounter: Payer: Self-pay | Admitting: Emergency Medicine

## 2017-06-27 ENCOUNTER — Other Ambulatory Visit: Payer: Self-pay

## 2017-06-27 DIAGNOSIS — M79606 Pain in leg, unspecified: Secondary | ICD-10-CM | POA: Insufficient documentation

## 2017-06-27 DIAGNOSIS — M25519 Pain in unspecified shoulder: Secondary | ICD-10-CM | POA: Insufficient documentation

## 2017-06-27 DIAGNOSIS — Z5321 Procedure and treatment not carried out due to patient leaving prior to being seen by health care provider: Secondary | ICD-10-CM | POA: Diagnosis not present

## 2017-06-27 NOTE — ED Triage Notes (Signed)
Patient complains of shoulder pain and leg pain like a pinched nerve. Patient denies any injury. States his neck hurts sometimes when he moves his neck to the left. 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

## 2017-06-27 NOTE — ED Notes (Signed)
Called for room no answer

## 2017-06-27 NOTE — ED Notes (Signed)
Called for room  No answer  

## 2017-07-20 ENCOUNTER — Other Ambulatory Visit: Payer: Self-pay

## 2017-07-20 ENCOUNTER — Encounter: Payer: Self-pay | Admitting: *Deleted

## 2017-07-20 ENCOUNTER — Emergency Department
Admission: EM | Admit: 2017-07-20 | Discharge: 2017-07-20 | Disposition: A | Discharge status: Home / Self Care | Payer: BLUE CROSS/BLUE SHIELD | Attending: Emergency Medicine | Admitting: Emergency Medicine

## 2017-07-20 DIAGNOSIS — Z5321 Procedure and treatment not carried out due to patient leaving prior to being seen by health care provider: Secondary | ICD-10-CM | POA: Diagnosis not present

## 2017-07-20 DIAGNOSIS — M79602 Pain in left arm: Secondary | ICD-10-CM | POA: Insufficient documentation

## 2017-07-20 LAB — CBC
HCT: 49 % (ref 40.0–52.0)
Hemoglobin: 16.3 g/dL (ref 13.0–18.0)
MCH: 29.8 pg (ref 26.0–34.0)
MCHC: 33.3 g/dL (ref 32.0–36.0)
MCV: 89.4 fL (ref 80.0–100.0)
PLATELETS: 242 10*3/uL (ref 150–440)
RBC: 5.48 MIL/uL (ref 4.40–5.90)
RDW: 14.9 % — AB (ref 11.5–14.5)
WBC: 5.5 10*3/uL (ref 3.8–10.6)

## 2017-07-20 LAB — BASIC METABOLIC PANEL
Anion gap: 7 (ref 5–15)
BUN: 11 mg/dL (ref 6–20)
CO2: 31 mmol/L (ref 22–32)
CREATININE: 1.01 mg/dL (ref 0.61–1.24)
Calcium: 8.9 mg/dL (ref 8.9–10.3)
Chloride: 100 mmol/L — ABNORMAL LOW (ref 101–111)
GFR calc non Af Amer: >60 mL/min (ref >60)
Glucose, Bld: 124 mg/dL — ABNORMAL HIGH (ref 65–99)
Potassium: 3.3 mmol/L — ABNORMAL LOW (ref 3.5–5.1)
Sodium: 138 mmol/L (ref 135–145)

## 2017-07-20 LAB — TROPONIN I: Troponin I: <0.03 ng/mL (ref <0.03)

## 2017-07-20 NOTE — ED Triage Notes (Signed)
Pt has left arm pain for 1 week.  No chest pain or sob.  No n/v/d   Pt reports pain when moving left arm and left shoulder.  Pt alert  Speech clear.

## 2017-07-20 NOTE — ED Notes (Signed)
Pt not found in lobby 

## 2017-07-20 NOTE — ED Notes (Signed)
No answer when called from lobby 

## 2017-07-28 IMAGING — RF DG SMALL BOWEL
10 series · 12 of 12 positions shown · non-contrast
Comparison: CT abdomen pelvis 04/11/2016

CLINICAL DATA: Abdominal pain left upper quadrant.

EXAM:
SMALL BOWEL SERIES
TECHNIQUE: Following ingestion of thin barium, serial small bowel images were
obtained including spot views of the terminal ileum.
FLUOROSCOPY TIME:  Fluoroscopy Time:  0 minutes 54 second
Radiation Exposure Index (if provided by the fluoroscopic device):
Number of Acquired Spot Images: 0

[Series 1: t abdomen supine · 0.15mm/px · 1 of 1 slices shown]
[im 1/1]
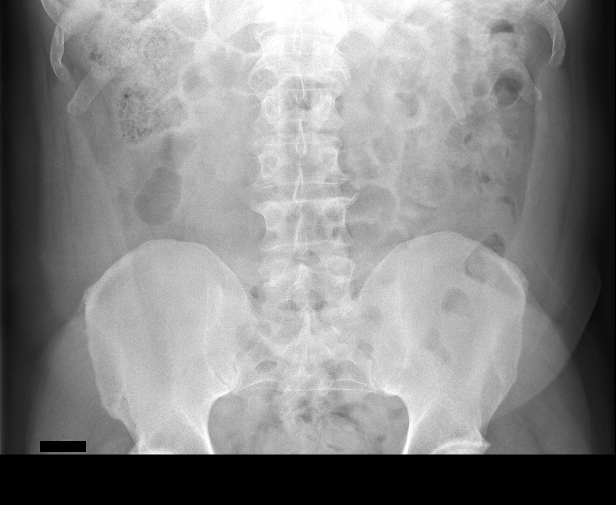

[Series 1: t abdomen barium ap · 0.14mm/px · 3 of 3 slices shown]
[im 1/3]
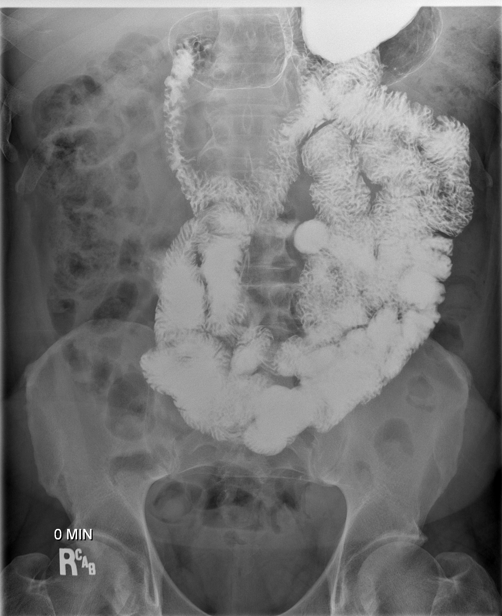
[im 2/3]
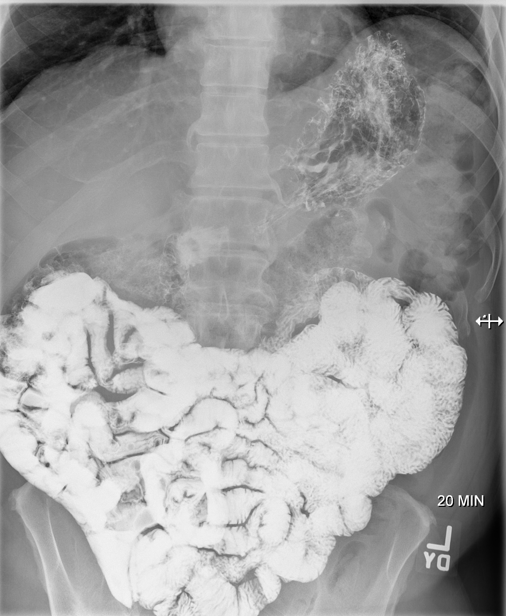
[im 3/3]
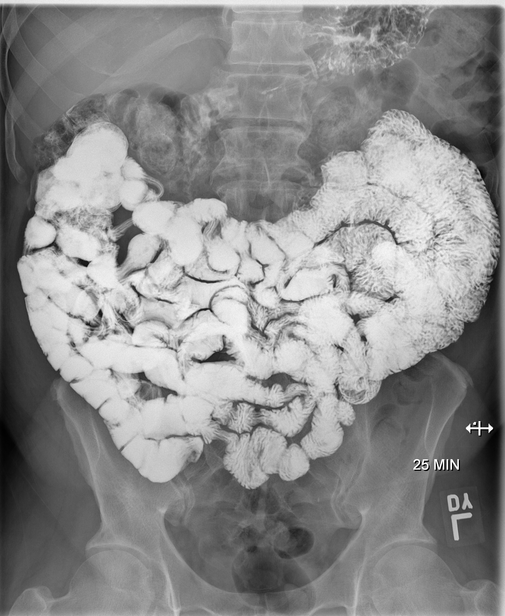

[Series 2: fluoro_barium singleshot_bw · 0.19mm/px · 1 of 1 slices shown (1 of 8)]
[im 1/1]
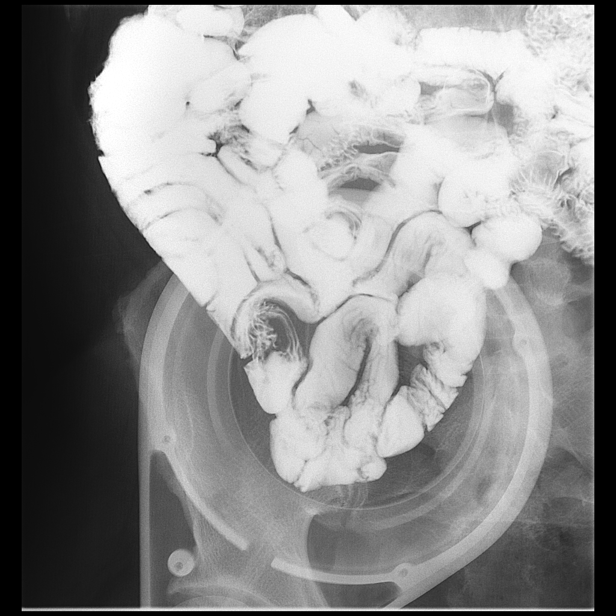

[Series 3: fluoro_barium singleshot_bw · 0.19mm/px · 1 of 1 slices shown (2 of 8)]
[im 1/1]
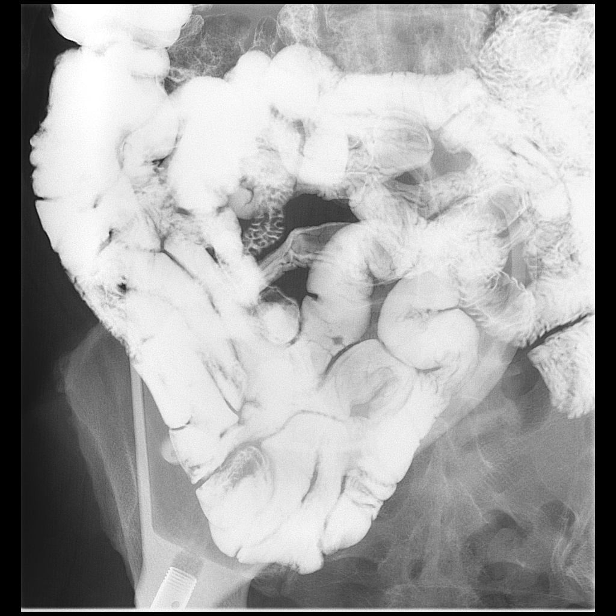

[Series 4: fluoro_barium singleshot_bw · 0.19mm/px · 1 of 1 slices shown (3 of 8)]
[im 1/1]
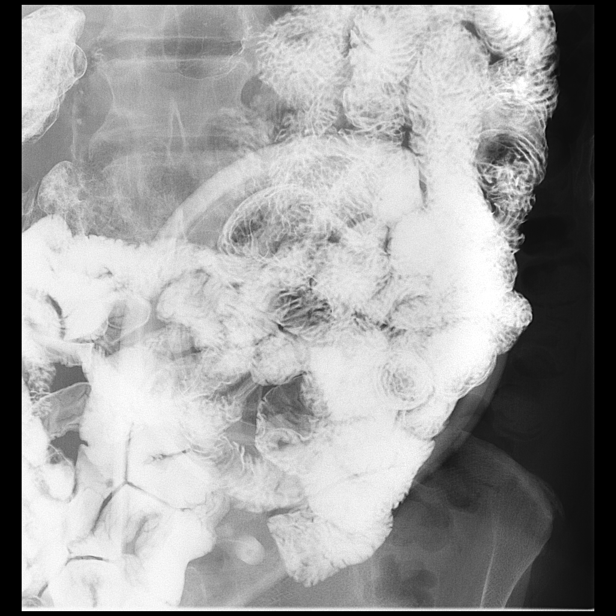

[Series 5: fluoro_barium singleshot_bw · 0.19mm/px · 1 of 1 slices shown (4 of 8)]
[im 1/1]
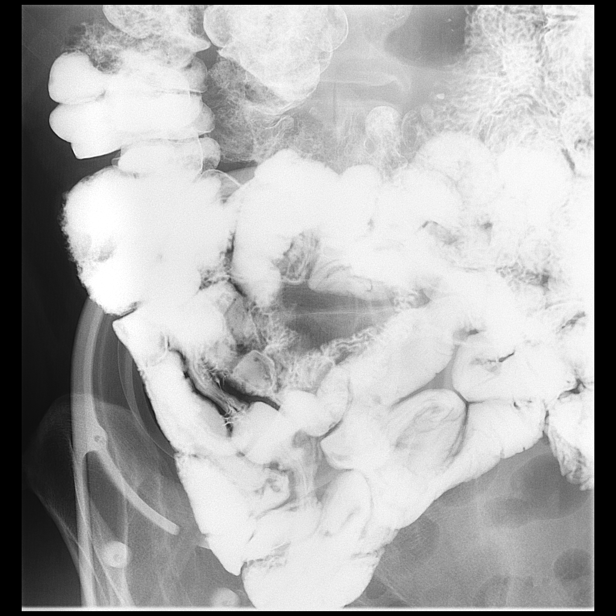

[Series 6: fluoro_barium singleshot_bw · 0.19mm/px · 1 of 1 slices shown (5 of 8)]
[im 1/1]
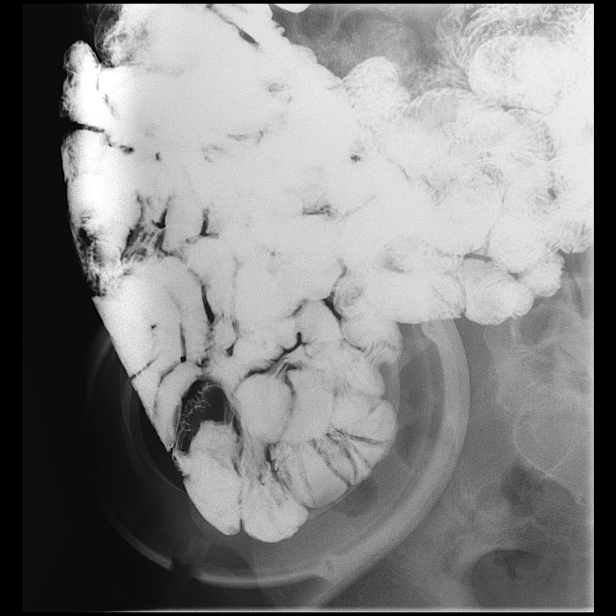

[Series 7: fluoro_barium singleshot_bw · 0.19mm/px · 1 of 1 slices shown (6 of 8)]
[im 1/1]
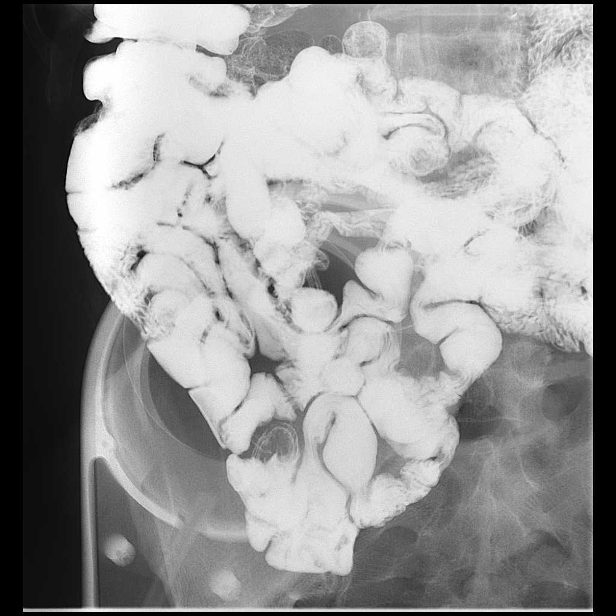

[Series 8: fluoro_barium singleshot_bw · 0.19mm/px · 1 of 1 slices shown (7 of 8)]
[im 1/1]
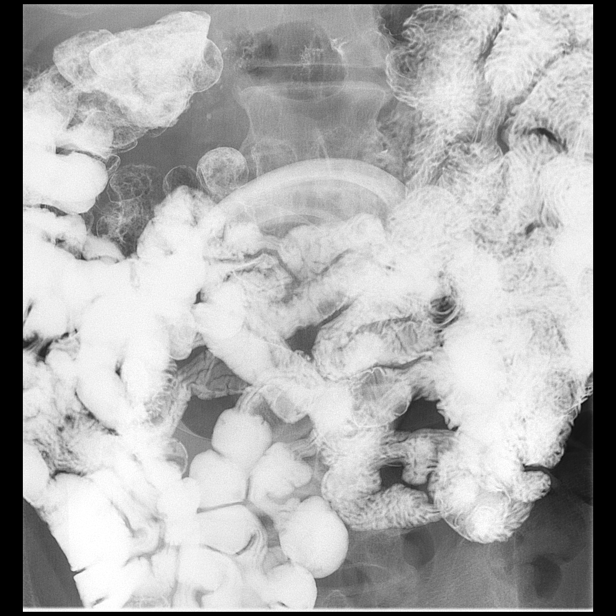

[Series 9: fluoro_barium singleshot_bw · 0.19mm/px · 1 of 1 slices shown (8 of 8)]
[im 1/1]
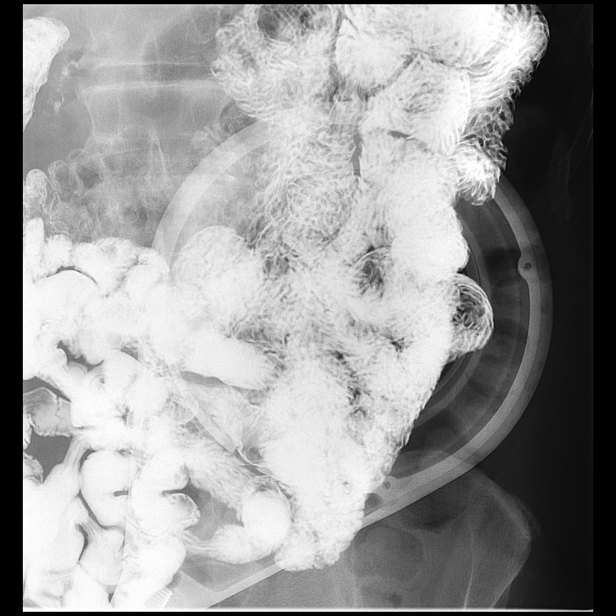

[12 of 12 positions shown; findings below may reference images not displayed]

FINDINGS: Rapid small-bowel transit time approximately 20 minutes.

Negative for bowel obstruction. No bowel edema. Small bowel normal
in caliber.

Small bowel loops are pliable and readily mobile upon palpation. No
bowel mass or edema is identified. Terminal ileum negative.
IMPRESSION: Relatively rapid small-bowel transit time. Otherwise within normal
limits.

## 2017-09-24 ENCOUNTER — Emergency Department: Payer: BLUE CROSS/BLUE SHIELD

## 2017-09-24 ENCOUNTER — Encounter: Payer: Self-pay | Admitting: Emergency Medicine

## 2017-09-24 ENCOUNTER — Other Ambulatory Visit: Payer: Self-pay

## 2017-09-24 ENCOUNTER — Emergency Department
Admission: EM | Admit: 2017-09-24 | Discharge: 2017-09-24 | Disposition: A | Discharge status: Home / Self Care | Payer: BLUE CROSS/BLUE SHIELD | Attending: Emergency Medicine | Admitting: Emergency Medicine

## 2017-09-24 DIAGNOSIS — R0789 Other chest pain: Secondary | ICD-10-CM | POA: Insufficient documentation

## 2017-09-24 DIAGNOSIS — Z79899 Other long term (current) drug therapy: Secondary | ICD-10-CM | POA: Insufficient documentation

## 2017-09-24 DIAGNOSIS — F17210 Nicotine dependence, cigarettes, uncomplicated: Secondary | ICD-10-CM | POA: Insufficient documentation

## 2017-09-24 DIAGNOSIS — E876 Hypokalemia: Secondary | ICD-10-CM

## 2017-09-24 DIAGNOSIS — I10 Essential (primary) hypertension: Secondary | ICD-10-CM | POA: Insufficient documentation

## 2017-09-24 DIAGNOSIS — R079 Chest pain, unspecified: Secondary | ICD-10-CM | POA: Diagnosis present

## 2017-09-24 LAB — COMPREHENSIVE METABOLIC PANEL
ALBUMIN: 4.3 g/dL (ref 3.5–5.0)
ALT: 26 U/L (ref 0–44)
AST: 25 U/L (ref 15–41)
Alkaline Phosphatase: 62 U/L (ref 38–126)
Anion gap: 8 (ref 5–15)
BILIRUBIN TOTAL: 0.7 mg/dL (ref 0.3–1.2)
BUN: 15 mg/dL (ref 6–20)
CHLORIDE: 104 mmol/L (ref 98–111)
CO2: 29 mmol/L (ref 22–32)
CREATININE: 1.07 mg/dL (ref 0.61–1.24)
Calcium: 8.6 mg/dL — ABNORMAL LOW (ref 8.9–10.3)
GFR calc Af Amer: >60 mL/min (ref >60)
Glucose, Bld: 149 mg/dL — ABNORMAL HIGH (ref 70–99)
POTASSIUM: 2.9 mmol/L — AB (ref 3.5–5.1)
Sodium: 141 mmol/L (ref 135–145)
Total Protein: 7.4 g/dL (ref 6.5–8.1)

## 2017-09-24 LAB — CBC
HCT: 49.5 % (ref 40.0–52.0)
HEMOGLOBIN: 16.8 g/dL (ref 13.0–18.0)
MCH: 30.3 pg (ref 26.0–34.0)
MCHC: 33.9 g/dL (ref 32.0–36.0)
MCV: 89.5 fL (ref 80.0–100.0)
PLATELETS: 210 10*3/uL (ref 150–440)
RBC: 5.52 MIL/uL (ref 4.40–5.90)
RDW: 15.2 % — ABNORMAL HIGH (ref 11.5–14.5)
WBC: 5 10*3/uL (ref 3.8–10.6)

## 2017-09-24 LAB — TROPONIN I

## 2017-09-24 LAB — ETHANOL: Alcohol, Ethyl (B): <10 mg/dL (ref <10)

## 2017-09-24 LAB — LIPASE, BLOOD: Lipase: 33 U/L (ref 11–51)

## 2017-09-24 MED ORDER — POTASSIUM CHLORIDE CRYS ER 20 MEQ PO TBCR: 20.0000 meq | EXTENDED_RELEASE_TABLET | Freq: Every day | ORAL | 0 refills | Status: AC

## 2017-09-24 MED ORDER — POTASSIUM CHLORIDE CRYS ER 20 MEQ PO TBCR
40.0000 meq | EXTENDED_RELEASE_TABLET | Freq: Once | ORAL | Status: AC
  Administered 2017-09-24: 40 meq via ORAL
  Filled 2017-09-24: qty 2

## 2017-09-24 MED ORDER — IBUPROFEN 600 MG PO TABS: ORAL_TABLET | ORAL | 0 refills | Status: DC

## 2017-09-24 MED ORDER — GI COCKTAIL ~~LOC~~
30.0000 mL | Freq: Once | ORAL | Status: AC
  Administered 2017-09-24: 30 mL via ORAL
  Filled 2017-09-24: qty 30

## 2017-09-24 NOTE — ED Notes (Signed)
Patient transported to X-ray 

## 2017-09-24 NOTE — ED Notes (Signed)
Pt reports CP that started at least 2 days ago, [facility] last night for same: no radiating left chest sharp pain, 8/10, denies N/V/D/diszziness or sweats  Pt is falling asleep during assessment, palpation reveals no tenderness

## 2017-09-24 NOTE — ED Provider Notes (Signed)
[facility] Emergency Department Provider Note  ____________________________________________   First MD Initiated Contact with Patient 09/24/17 0559     (approximate)  I have reviewed the triage vital signs and the nursing notes.   HISTORY  Chief Complaint Chest Pain    HPI Douglas Mcdaniel is a 57 y.o. male with past medical history as listed below and frequent emergency department visits for variety of complaints.  He presents by EMS tonight for chest pain.  He reports that he has had the chest pain for 3 days and it started after pulling something heavy out of his truck.  It is worse with movement and exertion.  He has had no shortness of breath.  He denies fever/chills, nausea, vomiting, abdominal pain, and dysuria.  He has a history of hypertension but no history of diabetes.  He does smoke.   Past Medical History:  Diagnosis Date  . Bilateral low back pain without sciatica 03/05/2014  . Bladder wall thickening 02/26/2014   Last Assessment & Plan:  - CT with bladder wall thickening possibly related to infection v. Neoplasm. - UA and urine cytology pending. - Urology referral for cystoscopy.  . Chronic abdominal pain 08/13/2015   Overview:  Chart Review:.  03/13/15- He underwent an exploratory laparotomy with all examined abdominal contents appearing normal including the entirety of the small bowel, appendix and colon, with no evidence of internal hernia. 03/17/15- CT: Segmental wall thickening in the proximal small bowel with mild dilatation, possibly enteritis, less likely obstruction. Correlate with recent laparoscopy.Smal  . 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 Anxiety. - Will continue to follow.  . Dental infection 02/26/2014  . Dyspepsia 01/15/2014   Last Assessment & Plan:  - Chronic, stable. - Refilled Pepcid.  . Elevated creatine kinase 07/17/2014   Last Assessment & Plan:  - Trending up 216 --> 330  --> 407 --> 295. 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  Other differential includes inflammatory myopathy (dermatomyositis v. Polymyositis) v. v. Medication effect.  Unlikely hypothyroidism as normal TSH. - Denies alcohol and cocaine use. - Continue to monitor.  [name] Epidermal inclusion cyst 08/13/2015   Last Assessment & Plan:  Discussed excision of the cyst in the future. Patient is in agreement.   . Erectile dysfunction 04/11/2014  . Generalized anxiety disorder 01/15/2014   Last Assessment & Plan:  - Counseled on importance of re-starting Zoloft 50 mg once daily. - Concern that patient has been presenting to the ED multiple times for array of symptoms likely related to anxiety. - Encouraged to make an appointment at [facility] if patient feels like he needs to go to ED as we have same-day appointments available.  Patient in agreement. - Close follow-up at [facility] in one week.  [name] GERD (gastroesophageal reflux disease)   . Hemorrhoids 04/05/2014  . Hypertension   . Left inguinal hernia 03/05/2014  . Nonintractable episodic headache 07/17/2014   Last Assessment & Plan:  - Concern that patient has been presenting to the ED with 7 visits since 3/2 for similar symptoms.  Has had multiple rounds of antibiotics.  See chart review in subjective section. - Encouraged to make an appointment at [facility] if patient feels like he needs to go to ED as we have same-day appointments available.  Patient in agreement. - Recommend continuing Flonase and using   . Screening for diabetes mellitus 01/15/2014  . Tobacco  use 09/16/2014  . Toe pain, right 04/05/2014  . Tonsillitis 02/12/2014    Patient Active Problem List   Diagnosis Date Noted  . Hypertension 04/26/2016  . Chronic abdominal pain 08/13/2015  . Epidermal inclusion cyst 08/13/2015  . Tobacco use 09/16/2014  . 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 Elevated creatine kinase 07/17/2014  . Nonintractable episodic headache 07/17/2014  .  Erectile dysfunction 04/11/2014  . Hemorrhoids 04/05/2014  . Toe pain, right 04/05/2014  . Bilateral low back pain without sciatica 03/05/2014  . Left inguinal hernia 03/05/2014  . Bladder wall thickening 02/26/2014  . Dental infection 02/26/2014  . Tonsillitis 02/12/2014  . Dyspepsia 01/15/2014  . Generalized anxiety disorder 01/15/2014  . GERD (gastroesophageal reflux disease) 01/15/2014  . Screening for diabetes mellitus 01/15/2014    Past Surgical History:  Procedure Laterality Date  . COLONOSCOPY    . COLONOSCOPY W/ POLYPECTOMY  02/07/2015  . head surgery    . head surgery    . LAPAROSCOPY ABDOMEN DIAGNOSTIC  03/13/2015  . UPPER GASTROINTESTINAL ENDOSCOPY  04/29/2014    Prior to Admission medications   Medication Sig Start Date End Date Taking? Authorizing Provider  albuterol (PROVENTIL HFA;VENTOLIN HFA) 108 (90 Base) MCG/ACT inhaler Inhale 2 puffs into the lungs every 6 (six) hours as needed for wheezing or shortness of breath. Patient not taking: Reported on 12/18/2016 08/20/16   Darci CurrentBrown, [place] N, MD  albuterol (PROVENTIL HFA;VENTOLIN HFA) 108 (90 Base) MCG/ACT inhaler Inhale 2 puffs into the lungs every 6 (six) hours as needed for wheezing or shortness of breath. 12/12/16   Governor RooksLord, Rebecca, MD  amLODipine (NORVASC) 10 MG tablet Take 1 tablet (10 mg total) by mouth daily. 09/08/16   Jeanmarie PlantMcShane, James A, MD  azithromycin (ZITHROMAX) 250 MG tablet Take 1 tablet (250 mg total) by mouth daily. 06/02/17   Irean HongSung, Jade J, MD  benzonatate (TESSALON PERLES) 100 MG capsule Take 1 capsule (100 mg total) by mouth 3 (three) times daily as needed for cough (Take 1-2 per dose). Patient not taking: Reported on 12/18/2016 08/20/16   Darci CurrentBrown, Vernon N, MD  benzonatate (TESSALON PERLES) 100 MG capsule Take 1 capsule (100 mg total) by mouth 3 (three) times daily as needed for cough. 12/12/16   Governor RooksLord, Rebecca, MD  famotidine (PEPCID) 20 MG tablet Take 20 mg by mouth 2 (two) times daily. 11/18/16   [provider]  famotidine (PEPCID) 20 MG tablet Take 1 tablet (20 mg total) 2 (two) times daily by mouth. 01/19/17   Sharman CheekStafford, Phillip, MD  fluticasone [facility](FLONASE) 50 MCG/ACT nasal spray Place 2 sprays into both nostrils daily. 02/23/16   Menshew, Charlesetta IvoryJenise V Bacon, PA-C  hydrochlorothiazide (HYDRODIURIL) 25 MG tablet Take 1 tablet (25 mg total) by mouth daily. 09/08/16   Jeanmarie PlantMcShane, James A, MD  HYDROcodone-acetaminophen (NORCO) 5-325 MG tablet Take 1 tablet every 6 (six) hours as needed by mouth for moderate pain. 01/12/17   Irean HongSung, Jade J, MD  ibuprofen (ADVIL,MOTRIN) 800 MG tablet Take 1 tablet (800 mg total) every 8 (eight) hours as needed by mouth for moderate pain. 01/12/17   Irean HongSung, Jade J, MD  metoCLOPramide (REGLAN) 10 MG tablet Take 1 tablet (10 mg total) every 6 (six) hours as needed by mouth. 01/19/17   Sharman CheekStafford, Phillip, MD  omeprazole (PRILOSEC) 40 MG capsule Take 1 capsule (40 mg total) by mouth daily. Patient not taking: Reported on 12/18/2016 09/08/16 09/08/17  Jeanmarie PlantMcShane, James A, MD  pantoprazole (PROTONIX) 40 MG tablet Take 1 tablet (40 mg total) by  mouth daily. Patient not taking: Reported on 12/18/2016 03/20/16 03/20/17  Darci Current, MD  predniSONE (DELTASONE) 10 MG tablet Take 5 tablets (50 mg total) by mouth daily. Patient not taking: Reported on 12/18/2016 12/12/16   Governor Rooks, MD  sucralfate (CARAFATE) 1 g tablet Take 1 tablet (1 g total) by mouth 4 (four) times daily. 12/18/16 12/18/17  Governor Rooks, MD  sucralfate (CARAFATE) 1 g tablet Take 1 tablet (1 g total) 4 (four) times daily by mouth. 01/19/17   Sharman Cheek, MD    Allergies Other  Family History  Problem Relation Age of Onset  . Diabetes Mother   . Multiple sclerosis Brother   . Breast cancer Maternal Aunt   . Lung cancer Maternal Grandfather   . Stomach cancer Paternal Grandmother   . Colon cancer Maternal Uncle     Social History Social History   Tobacco Use  . Smoking status: Current Every Day Smoker      Packs/day: 0.25    Years: 10.00    Pack years: 2.50    Types: Cigarettes  . Smokeless tobacco: Never Used  Substance Use Topics  . Alcohol use: Yes  . Drug use: No    Review of Systems Constitutional: No fever/chills Eyes: No visual changes. ENT: No sore throat. Cardiovascular: chest pain as described above Respiratory: Denies shortness of breath. Gastrointestinal: No abdominal pain.  No nausea, no vomiting.  No diarrhea.  No constipation. Genitourinary: Negative for dysuria. Musculoskeletal: Negative for neck pain.  Negative for back pain. Integumentary: Negative for rash. Neurological: Negative for headaches, focal weakness or numbness.   ____________________________________________   PHYSICAL EXAM:  VITAL SIGNS: ED Triage Vitals  Enc Vitals Group     BP 09/24/17 0522 133/68     Pulse Rate 09/24/17 0522 91     Resp 09/24/17 0522 18     Temp 09/24/17 0522 97.7 F (36.5 C)     Temp Source 09/24/17 0522 Oral     SpO2 09/24/17 0522 94 %     Weight 09/24/17 0522 95.3 kg (210 lb)     Height 09/24/17 0522 1.803 m (5\' 11" )     Head Circumference --      Peak Flow --      Pain Score 09/24/17 0521 8     Pain Loc --      Pain Edu? --      Excl. in GC? --     Constitutional: Alert and oriented. Well appearing and in no acute distress. The patient is quite somnolent but easily awakens to voice. Eyes: Conjunctivae are normal.  Head: Atraumatic. Nose: No congestion/rhinnorhea. Mouth/Throat: Mucous membranes are moist. Neck: No stridor.  No meningeal signs.   Cardiovascular: Normal rate, regular rhythm. Good peripheral circulation. Grossly normal heart sounds.  Highly reproducible chest wall tenderness to palpation and with movement of the torso. Respiratory: Normal respiratory effort.  No retractions. Lungs CTAB. Gastrointestinal: Soft and nontender. No distention.  Musculoskeletal: No lower extremity tenderness nor edema. No gross deformities of  extremities. Neurologic:  Normal speech and language. No gross focal neurologic deficits are appreciated.  Skin:  Skin is warm, dry and intact. No rash noted. Psychiatric: Mood and affect are normal. Speech and behavior are normal.  ____________________________________________   LABS (all labs ordered are listed, but only abnormal results are displayed)  Labs Reviewed  CBC - Abnormal; Notable for the following components:      Result Value   RDW 15.2 (*)    All  other components within normal limits  COMPREHENSIVE METABOLIC PANEL - Abnormal; Notable for the following components:   Potassium 2.9 (*)    Glucose, Bld 149 (*)    Calcium 8.6 (*)    All other components within normal limits  TROPONIN I  LIPASE, BLOOD  ETHANOL  URINE DRUG SCREEN, QUALITATIVE ([facility] ONLY)   ____________________________________________  EKG  ED ECG REPORT I, Loleta Rose, the attending physician, personally viewed and interpreted this ECG.  Date: 09/24/2017 EKG Time: 5:28 AM Rate: 80 Rhythm: normal sinus rhythm QRS Axis: normal Intervals: normal ST/T Wave abnormalities: Non-specific ST segment / T-wave changes, but no evidence of acute ischemia. Narrative Interpretation: no evidence of acute ischemia   ____________________________________________  RADIOLOGY I, Loleta Rose, personally viewed and evaluated these images (plain radiographs) as part of my medical decision making, as well as reviewing the written report by the radiologist.  ED MD interpretation: No acute pathology is evident on the chest x-ray  Official radiology report(s): Dg Chest 2 View  Result Date: 09/24/2017 CLINICAL DATA:  57 year old male with chest pain. EXAM: CHEST - 2 VIEW COMPARISON:  Chest radiograph dated 04/04/2017 FINDINGS: Minimal left lung base atelectatic changes. There is mild eventration of the left hemidiaphragm. No focal consolidation, pleural effusion, or pneumothorax. The cardiac silhouette is within  normal limits. No acute osseous pathology. IMPRESSION: No active cardiopulmonary disease. Electronically Signed   By: Elgie Collard M.D.   On: 09/24/2017 06:58    ____________________________________________   PROCEDURES  Critical Care performed: No   Procedure(s) performed:   Procedures   ____________________________________________   INITIAL IMPRESSION / ASSESSMENT AND PLAN / ED COURSE  As part of my medical decision making, I reviewed the following data within the electronic MEDICAL RECORD NUMBER Nursing notes reviewed and incorporated, Labs reviewed , EKG interpreted , Radiograph reviewed  and Notes from prior ED visits    Differential diagnosis includes, but is not limited to, musculoskeletal chest wall pain/strain, pneumonia, ACS, PE, pneumothorax.  The patient is well-appearing and in no acute distress and is in fact is sleeping quite comfortably until awakened by voice.  He is low risk for ACS based on HEART score and has highly reproducible chest wall pain that seems consistent with a musculoskeletal strain.  His potassium is little bit low at 2.9 which I repleted with oral potassium and a prescription.  Labs otherwise reassuring with a normal EKG and chest x-ray as well.  There is no indication for second troponin based on his history.  I will recommend NSAIDs and Tylenol and outpatient follow-up.  He states he understands and agrees with the plan.     ____________________________________________  FINAL CLINICAL IMPRESSION(S) / ED DIAGNOSES  Final diagnoses:  Chest wall pain     MEDICATIONS GIVEN DURING THIS VISIT:  Medications  potassium chloride SA (K-DUR,KLOR-CON) CR tablet 40 mEq (40 mEq Oral Given 09/24/17 0651)  gi cocktail (Maalox,Lidocaine,Donnatal) (30 mLs Oral Given 09/24/17 1610)     ED Discharge Orders    None       Note:  This document was prepared using Dragon voice recognition software and may include unintentional dictation errors.     Loleta Rose, MD 09/24/17 [phone]

## 2017-09-24 NOTE — Discharge Instructions (Signed)

## 2017-09-24 NOTE — ED Triage Notes (Signed)
Patient with complaint of chest pain times three days. Patient states that the pain is worse with movement and that he becomes short of breath when laying down.

## 2017-11-17 ENCOUNTER — Emergency Department
Admission: EM | Admit: 2017-11-17 | Discharge: 2017-11-17 | Disposition: A | Discharge status: Home / Self Care | Payer: BLUE CROSS/BLUE SHIELD | Attending: Emergency Medicine | Admitting: Emergency Medicine

## 2017-11-17 ENCOUNTER — Other Ambulatory Visit: Payer: Self-pay | Admitting: Emergency Medicine

## 2017-11-17 ENCOUNTER — Ambulatory Visit
Admission: RE | Admit: 2017-11-17 | Discharge: 2017-11-17 | Disposition: A | Discharge status: Home / Self Care | Payer: BLUE CROSS/BLUE SHIELD | Source: Ambulatory Visit | Attending: Emergency Medicine | Admitting: Emergency Medicine

## 2017-11-17 DIAGNOSIS — R05 Cough: Secondary | ICD-10-CM | POA: Diagnosis present

## 2017-11-17 DIAGNOSIS — I10 Essential (primary) hypertension: Secondary | ICD-10-CM | POA: Insufficient documentation

## 2017-11-17 DIAGNOSIS — R059 Cough, unspecified: Secondary | ICD-10-CM

## 2017-11-17 DIAGNOSIS — F17210 Nicotine dependence, cigarettes, uncomplicated: Secondary | ICD-10-CM | POA: Diagnosis not present

## 2017-11-17 NOTE — ED Notes (Signed)
Paper chart due to down time.

## 2017-11-17 NOTE — ED Triage Notes (Signed)
See paper chart for downtime documentation 

## 2018-02-12 ENCOUNTER — Other Ambulatory Visit: Payer: Self-pay

## 2018-02-12 ENCOUNTER — Emergency Department: Payer: BLUE CROSS/BLUE SHIELD

## 2018-02-12 ENCOUNTER — Encounter: Payer: Self-pay | Admitting: Emergency Medicine

## 2018-02-12 ENCOUNTER — Emergency Department
Admission: EM | Admit: 2018-02-12 | Discharge: 2018-02-12 | Disposition: A | Discharge status: Home / Self Care | Payer: BLUE CROSS/BLUE SHIELD | Attending: Emergency Medicine | Admitting: Emergency Medicine

## 2018-02-12 DIAGNOSIS — R079 Chest pain, unspecified: Secondary | ICD-10-CM | POA: Insufficient documentation

## 2018-02-12 DIAGNOSIS — R1010 Upper abdominal pain, unspecified: Secondary | ICD-10-CM | POA: Diagnosis present

## 2018-02-12 DIAGNOSIS — Z79899 Other long term (current) drug therapy: Secondary | ICD-10-CM | POA: Diagnosis not present

## 2018-02-12 DIAGNOSIS — I10 Essential (primary) hypertension: Secondary | ICD-10-CM | POA: Insufficient documentation

## 2018-02-12 DIAGNOSIS — K2970 Gastritis, unspecified, without bleeding: Secondary | ICD-10-CM | POA: Diagnosis not present

## 2018-02-12 DIAGNOSIS — F17210 Nicotine dependence, cigarettes, uncomplicated: Secondary | ICD-10-CM | POA: Diagnosis not present

## 2018-02-12 LAB — COMPREHENSIVE METABOLIC PANEL
ALK PHOS: 41 U/L (ref 38–126)
ALT: 26 U/L (ref 0–44)
ANION GAP: 10 (ref 5–15)
AST: 25 U/L (ref 15–41)
Albumin: 4.1 g/dL (ref 3.5–5.0)
BILIRUBIN TOTAL: 0.7 mg/dL (ref 0.3–1.2)
BUN: 14 mg/dL (ref 6–20)
CALCIUM: 8.8 mg/dL — AB (ref 8.9–10.3)
CO2: 27 mmol/L (ref 22–32)
Chloride: 102 mmol/L (ref 98–111)
Creatinine, Ser: 0.91 mg/dL (ref 0.61–1.24)
GFR calc non Af Amer: >60 mL/min (ref >60)
GLUCOSE: 107 mg/dL — AB (ref 70–99)
POTASSIUM: 3.3 mmol/L — AB (ref 3.5–5.1)
Sodium: 139 mmol/L (ref 135–145)
TOTAL PROTEIN: 7.2 g/dL (ref 6.5–8.1)

## 2018-02-12 LAB — URINALYSIS, COMPLETE (UACMP) WITH MICROSCOPIC
Bacteria, UA: NONE SEEN
Bilirubin Urine: NEGATIVE
Glucose, UA: NEGATIVE mg/dL
Hgb urine dipstick: NEGATIVE
Ketones, ur: NEGATIVE mg/dL
Leukocytes, UA: NEGATIVE
Nitrite: NEGATIVE
Protein, ur: NEGATIVE mg/dL
Specific Gravity, Urine: 1.025 (ref 1.005–1.030)
pH: 6 (ref 5.0–8.0)

## 2018-02-12 LAB — CBC
HCT: 50.8 % (ref 39.0–52.0)
Hemoglobin: 16.6 g/dL (ref 13.0–17.0)
MCH: 29.6 pg (ref 26.0–34.0)
MCHC: 32.7 g/dL (ref 30.0–36.0)
MCV: 90.7 fL (ref 80.0–100.0)
Platelets: 264 10*3/uL (ref 150–400)
RBC: 5.6 MIL/uL (ref 4.22–5.81)
RDW: 13.2 % (ref 11.5–15.5)
WBC: 5.4 10*3/uL (ref 4.0–10.5)
nRBC: 0 % (ref 0.0–0.2)

## 2018-02-12 LAB — TROPONIN I: Troponin I: <0.03 ng/mL (ref <0.03)

## 2018-02-12 LAB — LIPASE, BLOOD: Lipase: 25 U/L (ref 11–51)

## 2018-02-12 MED ORDER — PANTOPRAZOLE SODIUM 40 MG PO TBEC: 40.0000 mg | DELAYED_RELEASE_TABLET | Freq: Every day | ORAL | 1 refills | Status: AC

## 2018-02-12 MED ORDER — LIDOCAINE VISCOUS HCL 2 % MT SOLN
15.0000 mL | Freq: Once | OROMUCOSAL | Status: AC
  Administered 2018-02-12: 15 mL via ORAL
  Filled 2018-02-12: qty 15

## 2018-02-12 MED ORDER — ALUM & MAG HYDROXIDE-SIMETH 200-200-20 MG/5ML PO SUSP
30.0000 mL | Freq: Once | ORAL | Status: AC
  Administered 2018-02-12: 30 mL via ORAL
  Filled 2018-02-12: qty 30

## 2018-02-12 NOTE — ED Triage Notes (Signed)
Pt to ED via POV c/o mid abdominal pain since last night. Pt states that he has a hx/o gastritis. Pt states that he is also having some shortness of breath. ShOB is worse when he lays down. Pt is in NAD at this time.

## 2018-02-12 NOTE — ED Provider Notes (Signed)
[facility] Emergency Department Provider Note  Time seen: 9:34 AM  I have reviewed the triage vital signs and the nursing notes.   HISTORY  Chief Complaint Abdominal Pain    HPI Douglas Mcdaniel is a 57 y.o. male with a past medical history of gastric reflux, hypertension, chronic abdominal pain, presents to the emergency department for upper abdominal discomfort.  According to the patient over the past 2 to 3 days he has been experiencing upper abdominal discomfort described as more of a burning/pressure.  States occasionally the pain will go into his chest and occasionally he will feel short of breath.  Denies any vomiting.  Denies any diarrhea.  No lower abdominal discomfort.  Patient states a history of gastritis, takes omeprazole every day per patient.  States it feels somewhat similar to past gastritis.  Does admit drinking alcohol over the weekend.  No history of pancreatitis per patient.   Past Medical History:  Diagnosis Date  . Bilateral low back pain without sciatica 03/05/2014  . Bladder wall thickening 02/26/2014   Last Assessment & Plan:  - CT with bladder wall thickening possibly related to infection v. Neoplasm. - UA and urine cytology pending. - Urology referral for cystoscopy.  . Chronic abdominal pain 08/13/2015   Overview:  Chart Review:.  03/13/15- He underwent an exploratory laparotomy with all examined abdominal contents appearing normal including the entirety of the small bowel, appendix and colon, with no evidence of internal hernia. 03/17/15- CT: Segmental wall thickening in the proximal small bowel with mild dilatation, possibly enteritis, less likely obstruction. Correlate with recent laparoscopy.Smal  . 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 Anxiety. - Will continue to follow.  . Dental infection 02/26/2014  . Dyspepsia 01/15/2014   Last Assessment & Plan:  - Chronic, stable. - Refilled  Pepcid.  . Elevated creatine kinase 07/17/2014   Last Assessment & Plan:  - Trending up 216 --> 330 --> 407 --> 295. 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  Other differential includes inflammatory myopathy (dermatomyositis v. Polymyositis) v. v. Medication effect.  Unlikely hypothyroidism as normal TSH. - Denies alcohol and cocaine use. - Continue to monitor.  [name] Epidermal inclusion cyst 08/13/2015   Last Assessment & Plan:  Discussed excision of the cyst in the future. Patient is in agreement.   . Erectile dysfunction 04/11/2014  . Generalized anxiety disorder 01/15/2014   Last Assessment & Plan:  - Counseled on importance of re-starting Zoloft 50 mg once daily. - Concern that patient has been presenting to the ED multiple times for array of symptoms likely related to anxiety. - Encouraged to make an appointment at [facility] if patient feels like he needs to go to ED as we have same-day appointments available.  Patient in agreement. - Close follow-up at [facility] in one week.  [name] GERD (gastroesophageal reflux disease)   . Hemorrhoids 04/05/2014  . Hypertension   . Left inguinal hernia 03/05/2014  . Nonintractable episodic headache 07/17/2014   Last Assessment & Plan:  - Concern that patient has been presenting to the ED with 7 visits since 3/2 for similar symptoms.  Has had multiple rounds of antibiotics.  See chart review in subjective section. - Encouraged to make an appointment at [facility] if patient feels like he needs to go to ED as we have same-day appointments available.  Patient in agreement. - Recommend continuing Flonase and using   .  Screening for diabetes mellitus 01/15/2014  . Tobacco use 09/16/2014  . Toe pain, right 04/05/2014  . Tonsillitis 02/12/2014    Patient Active Problem List   Diagnosis Date Noted  . Hypertension 04/26/2016  . Chronic abdominal pain 08/13/2015  . Epidermal inclusion cyst 08/13/2015  . Tobacco use 09/16/2014  . Delayed ejaculation  08/23/2014  . Elevated creatine kinase 07/17/2014  . Nonintractable episodic headache 07/17/2014  . Erectile dysfunction 04/11/2014  . Hemorrhoids 04/05/2014  . Toe pain, right 04/05/2014  . Bilateral low back pain without sciatica 03/05/2014  . Left inguinal hernia 03/05/2014  . Bladder wall thickening 02/26/2014  . Dental infection 02/26/2014  . Tonsillitis 02/12/2014  . Dyspepsia 01/15/2014  . Generalized anxiety disorder 01/15/2014  . GERD (gastroesophageal reflux disease) 01/15/2014  . Screening for diabetes mellitus 01/15/2014    Past Surgical History:  Procedure Laterality Date  . COLONOSCOPY    . COLONOSCOPY W/ POLYPECTOMY  02/07/2015  . head surgery    . head surgery    . LAPAROSCOPY ABDOMEN DIAGNOSTIC  03/13/2015  . UPPER GASTROINTESTINAL ENDOSCOPY  04/29/2014    Prior to Admission medications   Medication Sig Start Date End Date Taking? Authorizing Provider  albuterol (PROVENTIL HFA;VENTOLIN HFA) 108 (90 Base) MCG/ACT inhaler Inhale 2 puffs into the lungs every 6 (six) hours as needed for wheezing or shortness of breath. Patient not taking: Reported on 12/18/2016 08/20/16   Darci Current, MD  albuterol (PROVENTIL HFA;VENTOLIN HFA) 108 (90 Base) MCG/ACT inhaler Inhale 2 puffs into the lungs every 6 (six) hours as needed for wheezing or shortness of breath. 12/12/16   Governor Rooks, MD  amLODipine (NORVASC) 10 MG tablet Take 1 tablet (10 mg total) by mouth daily. 09/08/16   Jeanmarie Plant, MD  azithromycin (ZITHROMAX) 250 MG tablet Take 1 tablet (250 mg total) by mouth daily. 06/02/17   Irean Hong, MD  benzonatate (TESSALON PERLES) 100 MG capsule Take 1 capsule (100 mg total) by mouth 3 (three) times daily as needed for cough (Take 1-2 per dose). Patient not taking: Reported on 12/18/2016 08/20/16   Darci Current, MD  benzonatate (TESSALON PERLES) 100 MG capsule Take 1 capsule (100 mg total) by mouth 3 (three) times daily as needed for cough. 12/12/16   Governor Rooks, MD  famotidine (PEPCID) 20 MG tablet Take 20 mg by mouth 2 (two) times daily. 11/18/16   [provider]  famotidine (PEPCID) 20 MG tablet Take 1 tablet (20 mg total) 2 (two) times daily by mouth. 01/19/17   Sharman Cheek, MD  fluticasone [facility]) 50 MCG/ACT nasal spray Place 2 sprays into both nostrils daily. 02/23/16   Menshew, Charlesetta Ivory, PA-C  hydrochlorothiazide (HYDRODIURIL) 25 MG tablet Take 1 tablet (25 mg total) by mouth daily. 09/08/16   Jeanmarie Plant, MD  HYDROcodone-acetaminophen (NORCO) 5-325 MG tablet Take 1 tablet every 6 (six) hours as needed by mouth for moderate pain. 01/12/17   Irean Hong, MD  ibuprofen (ADVIL,MOTRIN) 600 MG tablet Take 1 tablet by mouth three times daily with meals 09/24/17   Loleta Rose, MD  metoCLOPramide (REGLAN) 10 MG tablet Take 1 tablet (10 mg total) every 6 (six) hours as needed by mouth. 01/19/17   Sharman Cheek, MD  omeprazole (PRILOSEC) 40 MG capsule Take 1 capsule (40 mg total) by mouth daily. Patient not taking: Reported on 12/18/2016 09/08/16 09/08/17  Jeanmarie Plant, MD  pantoprazole (PROTONIX) 40 MG tablet Take 1 tablet (40 mg total) by  mouth daily. Patient not taking: Reported on 12/18/2016 03/20/16 03/20/17  Darci CurrentBrown, [place] N, MD  potassium chloride SA (KLOR-CON M20) 20 MEQ tablet Take 1 tablet (20 mEq total) by mouth daily. 09/24/17   Loleta RoseForbach, Cory, MD  predniSONE (DELTASONE) 10 MG tablet Take 5 tablets (50 mg total) by mouth daily. Patient not taking: Reported on 12/18/2016 12/12/16   Governor RooksLord, Rebecca, MD  sucralfate (CARAFATE) 1 g tablet Take 1 tablet (1 g total) by mouth 4 (four) times daily. 12/18/16 12/18/17  Governor RooksLord, Rebecca, MD  sucralfate (CARAFATE) 1 g tablet Take 1 tablet (1 g total) 4 (four) times daily by mouth. 01/19/17   Sharman CheekStafford, Phillip, MD    Allergies  Allergen Reactions  . Other Rash    Tomatoes-hives    Family History  Problem Relation Age of Onset  . Diabetes Mother   . Multiple sclerosis  Brother   . Breast cancer Maternal Aunt   . Lung cancer Maternal Grandfather   . Stomach cancer Paternal Grandmother   . Colon cancer Maternal Uncle     Social History Social History   Tobacco Use  . Smoking status: Current Every Day Smoker    Packs/day: 0.25    Years: 10.00    Pack years: 2.50    Types: Cigarettes  . Smokeless tobacco: Never Used  Substance Use Topics  . Alcohol use: Yes  . Drug use: No    Review of Systems Constitutional: Negative for fever Cardiovascular: Upper abdominal discomfort x3 days occasionally radiating into the chest. Respiratory: Intermittent shortness of breath Gastrointestinal: Upper abdominal pain/burning.  Negative for vomiting or diarrhea. Genitourinary: Negative for urinary compaints Musculoskeletal: Negative for leg pain or swelling. Skin: Negative for skin complaints  Neurological: Negative for headache All other ROS negative  ____________________________________________   PHYSICAL EXAM:  VITAL SIGNS: ED Triage Vitals  Enc Vitals Group     BP 02/12/18 0813 117/74     Pulse Rate 02/12/18 0813 88     Resp 02/12/18 0813 16     Temp 02/12/18 0813 98.2 F (36.8 C)     Temp Source 02/12/18 0813 Oral     SpO2 02/12/18 0813 96 %     Weight 02/12/18 0814 210 lb (95.3 kg)     Height 02/12/18 0814 5\' 9"  (1.753 m)     Head Circumference --      Peak Flow --      Pain Score 02/12/18 0813 10     Pain Loc --      Pain Edu? --      Excl. in GC? --    Constitutional: Alert and oriented. Well appearing and in no distress. Eyes: Normal exam ENT   Head: Normocephalic and atraumatic.   Mouth/Throat: Mucous membranes are moist. Cardiovascular: Normal rate, regular rhythm. No murmur Respiratory: Normal respiratory effort without tachypnea nor retractions. Breath sounds are clear  Gastrointestinal: Soft, mild epigastric tenderness.  No rebound guarding or distention.   Musculoskeletal: Nontender with normal range of motion in all  extremities. No lower extremity tenderness or edema. Neurologic:  Normal speech and language. No gross focal neurologic deficits Skin:  Skin is warm, dry and intact.  Psychiatric: Mood and affect are normal.   ____________________________________________    EKG  EKG viewed and interpreted by myself shows a normal sinus rhythm at 74 bpm with a narrow QRS, normal axis, normal intervals, nonspecific ST changes.  ____________________________________________    RADIOLOGY  Chest x-ray negative  ____________________________________________   INITIAL IMPRESSION / ASSESSMENT  AND PLAN / ED COURSE  Pertinent labs & imaging results that were available during my care of the patient were reviewed by me and considered in my medical decision making (see chart for details).  Patient presents to the emergency department for upper abdominal discomfort and intermittent chest pain shortness of breath ongoing over the past 3 days.  Currently the patient appears well, no distress, lying in bed watching TV.  Does admit to using alcohol over the weekend.  Differential would include gastritis, pancreatitis, gallbladder disease, ACS, gastric or peptic ulcer disease.  Patient's labs are overall reassuring, normal lipase and LFTs.  Normal white blood cell count.  We will obtain a troponin and a chest x-ray as a precaution.  We will treat with a GI cocktail and continue to closely monitor.  Patient's work-up is largely within normal limits.  Cardiac enzymes are normal.  LFTs and lipase are normal.  Patient does state relief after GI cocktail highly suspect gastritis versus gastric/peptic ulcers.  Patient states he Artie sees a GI doctor and will call to arrange a follow-up appointment.  We will discharge on Protonix instead of omeprazole I also recommended over-the-counter Maalox after each meal and before going to bed for the next 3 or 4 days.  Patient agreeable to plan of  care.  ____________________________________________   FINAL CLINICAL IMPRESSION(S) / ED DIAGNOSES  Upper abdominal pain Chest pain Gastritis   Minna Antis, MD 02/12/18 1125

## 2018-02-12 NOTE — ED Notes (Signed)
ED Provider at bedside. 

## 2018-03-02 ENCOUNTER — Emergency Department: Payer: BLUE CROSS/BLUE SHIELD

## 2018-03-02 ENCOUNTER — Emergency Department
Admission: EM | Admit: 2018-03-02 | Discharge: 2018-03-02 | Disposition: A | Discharge status: Home / Self Care | Payer: BLUE CROSS/BLUE SHIELD | Attending: Emergency Medicine | Admitting: Emergency Medicine

## 2018-03-02 ENCOUNTER — Other Ambulatory Visit: Payer: Self-pay

## 2018-03-02 DIAGNOSIS — B9789 Other viral agents as the cause of diseases classified elsewhere: Secondary | ICD-10-CM

## 2018-03-02 DIAGNOSIS — I10 Essential (primary) hypertension: Secondary | ICD-10-CM | POA: Insufficient documentation

## 2018-03-02 DIAGNOSIS — F17210 Nicotine dependence, cigarettes, uncomplicated: Secondary | ICD-10-CM | POA: Diagnosis not present

## 2018-03-02 DIAGNOSIS — R05 Cough: Secondary | ICD-10-CM | POA: Diagnosis present

## 2018-03-02 DIAGNOSIS — G933 Postviral fatigue syndrome: Secondary | ICD-10-CM | POA: Insufficient documentation

## 2018-03-02 DIAGNOSIS — Z79899 Other long term (current) drug therapy: Secondary | ICD-10-CM | POA: Insufficient documentation

## 2018-03-02 DIAGNOSIS — J069 Acute upper respiratory infection, unspecified: Secondary | ICD-10-CM | POA: Insufficient documentation

## 2018-03-02 DIAGNOSIS — G9331 Postviral fatigue syndrome: Secondary | ICD-10-CM

## 2018-03-02 MED ORDER — DEXAMETHASONE 4 MG PO TABS
12.0000 mg | ORAL_TABLET | Freq: Once | ORAL | Status: AC
  Administered 2018-03-02: 12 mg via ORAL
  Filled 2018-03-02: qty 3

## 2018-03-02 MED ORDER — IBUPROFEN 600 MG PO TABS: 600.0000 mg | ORAL_TABLET | Freq: Three times a day (TID) | ORAL | 0 refills | Status: AC | PRN

## 2018-03-02 MED ORDER — IPRATROPIUM BROMIDE 0.06 % NA SOLN: 2.0000 | Freq: Three times a day (TID) | NASAL | 11 refills | Status: AC

## 2018-03-02 MED ORDER — IBUPROFEN 600 MG PO TABS
600.0000 mg | ORAL_TABLET | Freq: Once | ORAL | Status: AC
  Administered 2018-03-02: 600 mg via ORAL
  Filled 2018-03-02: qty 1

## 2018-03-02 MED ORDER — BENZONATATE 200 MG PO CAPS: 200.0000 mg | ORAL_CAPSULE | Freq: Four times a day (QID) | ORAL | 0 refills | Status: AC | PRN

## 2018-03-02 MED ORDER — IPRATROPIUM-ALBUTEROL 0.5-2.5 (3) MG/3ML IN SOLN
3.0000 mL | Freq: Once | RESPIRATORY_TRACT | Status: AC
  Administered 2018-03-02: 3 mL via RESPIRATORY_TRACT
  Filled 2018-03-02: qty 3

## 2018-03-02 NOTE — ED Notes (Signed)
Took mucinex and tylenol last evening

## 2018-03-02 NOTE — Discharge Instructions (Signed)
Fortunately today your chest x-ray is reassuring and you do not have pneumonia.  It is normal for the last symptom to go away to be your cough so please take your nasal spray as prescribed and use cough medication as needed for severe symptoms.  Please do not go to work today as you are clearly dehydrated and need to recuperate.  Return to the emergency department for any concerns.  It was a pleasure to take care of you today, and thank you for coming to our emergency department.  If you have any questions or concerns before leaving please ask the nurse to grab me and I'm more than happy to go through your aftercare instructions again.  If you have any concerns once you are home that you are not improving or are in fact getting worse before you can make it to your follow-up appointment, please do not hesitate to call 911 and come back for further evaluation.  Merrily BrittleNeil Sandara Tyree, MD  Results for orders placed or performed during the hospital encounter of 02/12/18  Lipase, blood  Result Value Ref Range   Lipase 25 11 - 51 U/L  Comprehensive metabolic panel  Result Value Ref Range   Sodium 139 135 - 145 mmol/L   Potassium 3.3 (L) 3.5 - 5.1 mmol/L   Chloride 102 98 - 111 mmol/L   CO2 27 22 - 32 mmol/L   Glucose, Bld 107 (H) 70 - 99 mg/dL   BUN 14 6 - 20 mg/dL   Creatinine, Ser 1.610.91 0.61 - 1.24 mg/dL   Calcium 8.8 (L) 8.9 - 10.3 mg/dL   Total Protein 7.2 6.5 - 8.1 g/dL   Albumin 4.1 3.5 - 5.0 g/dL   AST 25 15 - 41 U/L   ALT 26 0 - 44 U/L   Alkaline Phosphatase 41 38 - 126 U/L   Total Bilirubin 0.7 0.3 - 1.2 mg/dL   GFR calc non Af Amer >60 >60 mL/min   GFR calc Af Amer >60 >60 mL/min   Anion gap 10 5 - 15  CBC  Result Value Ref Range   WBC 5.4 4.0 - 10.5 K/uL   RBC 5.60 4.22 - 5.81 MIL/uL   Hemoglobin 16.6 13.0 - 17.0 g/dL   HCT 09.650.8 04.539.0 - 40.952.0 %   MCV 90.7 80.0 - 100.0 fL   MCH 29.6 26.0 - 34.0 pg   MCHC 32.7 30.0 - 36.0 g/dL   RDW 81.113.2 91.411.5 - 78.215.5 %   Platelets 264 150 - 400 K/uL     nRBC 0.0 0.0 - 0.2 %  Urinalysis, Complete w Microscopic  Result Value Ref Range   Color, Urine YELLOW (A) YELLOW   APPearance CLEAR (A) CLEAR   Specific Gravity, Urine 1.025 1.005 - 1.030   pH 6.0 5.0 - 8.0   Glucose, UA NEGATIVE NEGATIVE mg/dL   Hgb urine dipstick NEGATIVE NEGATIVE   Bilirubin Urine NEGATIVE NEGATIVE   Ketones, ur NEGATIVE NEGATIVE mg/dL   Protein, ur NEGATIVE NEGATIVE mg/dL   Nitrite NEGATIVE NEGATIVE   Leukocytes, UA NEGATIVE NEGATIVE   RBC / HPF 0-5 0 - 5 RBC/hpf   WBC, UA 0-5 0 - 5 WBC/hpf   Bacteria, UA NONE SEEN NONE SEEN   Squamous Epithelial / LPF 0-5 0 - 5   Mucus PRESENT   Troponin I - Add-On to previous collection  Result Value Ref Range   Troponin I <0.03 <0.03 ng/mL   Dg Chest 2 View  Result Date: 03/02/2018 CLINICAL DATA:  Cough and chest pain for several days EXAM: CHEST - 2 VIEW COMPARISON:  02/12/2018 FINDINGS: The heart size and mediastinal contours are within normal limits. Both lungs are clear. The visualized skeletal structures are unremarkable. IMPRESSION: No active cardiopulmonary disease. Electronically Signed   By: Alcide CleverMark  Lukens M.D.   On: 03/02/2018 01:59   Dg Chest 2 View  Result Date: 02/12/2018 CLINICAL DATA:  Abdominal pain since last night. Shortness of breath. Anxiety. Hypertension. EXAM: CHEST - 2 VIEW COMPARISON:  11/17/2017 FINDINGS: Midline trachea. Normal heart size. Atherosclerosis in the transverse aorta. No pleural effusion or pneumothorax. Mild left hemidiaphragm elevation with left infrahilar scarring or subsegmental atelectasis. No lobar consolidation. No free intraperitoneal air. IMPRESSION: No acute cardiopulmonary disease. Electronically Signed   By: Jeronimo GreavesKyle  Talbot M.D.   On: 02/12/2018 10:20

## 2018-03-02 NOTE — ED Triage Notes (Addendum)
Pt arrives to ED via POV from home with c/o non-productive cough and URI-like s/x's x5-6 days. Pt reports taking OTC medications without relief. No c/o N/V/D or fever. Pt reports his chest hurts "from coughing". Pt is A&O, in NAD; RR even, regular, and unlabored.

## 2018-03-02 NOTE — ED Provider Notes (Signed)
[facility] Emergency Department Provider Note  ____________________________________________   First MD Initiated Contact with Patient 03/02/18 [id number]     (approximate)  I have reviewed the triage vital signs and the nursing notes.   HISTORY  Chief Complaint Cough and URI   HPI Douglas Mcdaniel is a 57 y.o. male who comes to the emergency department with nonproductive cough and congestion along with fever for the past 5 or 6 days or so.  He is concerned because he is been taking over-the-counter medications and his symptoms have persisted.  He says that he has been coughing so continuously that is begun to cause chest discomfort.  His pain is sharp and aching and worse when taking a deep breath.     Past Medical History:  Diagnosis Date  . Bilateral low back pain without sciatica 03/05/2014  . Bladder wall thickening 02/26/2014   Last Assessment & Plan:  - CT with bladder wall thickening possibly related to infection v. Neoplasm. - UA and urine cytology pending. - Urology referral for cystoscopy.  . Chronic abdominal pain 08/13/2015   Overview:  Chart Review:.  03/13/15- He underwent an exploratory laparotomy with all examined abdominal contents appearing normal including the entirety of the small bowel, appendix and colon, with no evidence of internal hernia. 03/17/15- CT: Segmental wall thickening in the proximal small bowel with mild dilatation, possibly enteritis, less likely obstruction. Correlate with recent laparoscopy.Smal  . 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 Anxiety. - Will continue to follow.  . Dental infection 02/26/2014  . Dyspepsia 01/15/2014   Last Assessment & Plan:  - Chronic, stable. - Refilled Pepcid.  . Elevated creatine kinase 07/17/2014   Last Assessment & Plan:  - Trending up 216 --> 330 --> 407 --> 295. 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   Other differential includes inflammatory myopathy (dermatomyositis v. Polymyositis) v. v. Medication effect.  Unlikely hypothyroidism as normal TSH. - Denies alcohol and cocaine use. - Continue to monitor.  [name]. Epidermal inclusion cyst 08/13/2015   Last Assessment & Plan:  Discussed excision of the cyst in the future. Patient is in agreement.   . Erectile dysfunction 04/11/2014  . Generalized anxiety disorder 01/15/2014   Last Assessment & Plan:  - Counseled on importance of re-starting Zoloft 50 mg once daily. - Concern that patient has been presenting to the ED multiple times for array of symptoms likely related to anxiety. - Encouraged to [facility] if patient feels like he needs to go to ED as we have same-day appointments available.  Patient in agreement. - [facility] in one week.  [name]. GERD (gastroesophageal reflux disease)   . Hemorrhoids 04/05/2014  . Hypertension   . Left inguinal hernia 03/05/2014  . Nonintractable episodic headache 07/17/2014   Last Assessment & Plan:  - Concern that patient has been presenting to the ED with 7 visits since 3/2 for similar symptoms.  Has had multiple rounds of antibiotics.  See chart review in subjective section. - Encouraged to make an appointment at [facility] if patient feels like he needs to go to ED as we have same-day appointments available.  Patient in agreement. - Recommend continuing Flonase and using   . Screening for diabetes mellitus 01/15/2014  . Tobacco use 09/16/2014  . Toe pain, right 04/05/2014  . Tonsillitis 02/12/2014    Patient Active Problem List  Diagnosis Date Noted  . Hypertension 04/26/2016  . Chronic abdominal pain 08/13/2015  . Epidermal inclusion cyst 08/13/2015  . Tobacco use 09/16/2014  . 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 Elevated creatine kinase 07/17/2014  . Nonintractable episodic headache 07/17/2014  . Erectile dysfunction 04/11/2014  . Hemorrhoids 04/05/2014  . Toe pain, right 04/05/2014  . Bilateral  low back pain without sciatica 03/05/2014  . Left inguinal hernia 03/05/2014  . Bladder wall thickening 02/26/2014  . Dental infection 02/26/2014  . Tonsillitis 02/12/2014  . Dyspepsia 01/15/2014  . Generalized anxiety disorder 01/15/2014  . GERD (gastroesophageal reflux disease) 01/15/2014  . Screening for diabetes mellitus 01/15/2014    Past Surgical History:  Procedure Laterality Date  . COLONOSCOPY    . COLONOSCOPY W/ POLYPECTOMY  02/07/2015  . head surgery    . head surgery    . LAPAROSCOPY ABDOMEN DIAGNOSTIC  03/13/2015  . UPPER GASTROINTESTINAL ENDOSCOPY  04/29/2014    Prior to Admission medications   Medication Sig Start Date End Date Taking? Authorizing Provider  albuterol (PROVENTIL HFA;VENTOLIN HFA) 108 (90 Base) MCG/ACT inhaler Inhale 2 puffs into the lungs every 6 (six) hours as needed for wheezing or shortness of breath. Patient not taking: Reported on 12/18/2016 08/20/16   Darci Current, MD  albuterol (PROVENTIL HFA;VENTOLIN HFA) 108 (90 Base) MCG/ACT inhaler Inhale 2 puffs into the lungs every 6 (six) hours as needed for wheezing or shortness of breath. 12/12/16   Governor Rooks, MD  amLODipine (NORVASC) 10 MG tablet Take 1 tablet (10 mg total) by mouth daily. 09/08/16   Jeanmarie Plant, MD  azithromycin (ZITHROMAX) 250 MG tablet Take 1 tablet (250 mg total) by mouth daily. 06/02/17   Irean Hong, MD  benzonatate (TESSALON) 200 MG capsule Take 1 capsule (200 mg total) by mouth every 6 (six) hours as needed for cough. 03/02/18 03/02/19  Merrily Brittle, MD  famotidine (PEPCID) 20 MG tablet Take 20 mg by mouth 2 (two) times daily. 11/18/16   [provider]  famotidine (PEPCID) 20 MG tablet Take 1 tablet (20 mg total) 2 (two) times daily by mouth. 01/19/17   Sharman Cheek, MD  fluticasone [facility]) 50 MCG/ACT nasal spray Place 2 sprays into both nostrils daily. 02/23/16   Menshew, Charlesetta Ivory, PA-C  hydrochlorothiazide (HYDRODIURIL) 25 MG tablet Take 1  tablet (25 mg total) by mouth daily. 09/08/16   Jeanmarie Plant, MD  HYDROcodone-acetaminophen (NORCO) 5-325 MG tablet Take 1 tablet every 6 (six) hours as needed by mouth for moderate pain. 01/12/17   Irean Hong, MD  ibuprofen (ADVIL,MOTRIN) 600 MG tablet Take 1 tablet (600 mg total) by mouth every 8 (eight) hours as needed. 03/02/18   Merrily Brittle, MD  ipratropium (ATROVENT) 0.06 % nasal spray Place 2 sprays into the nose 3 (three) times daily. 03/02/18 03/02/19  Merrily Brittle, MD  metoCLOPramide (REGLAN) 10 MG tablet Take 1 tablet (10 mg total) every 6 (six) hours as needed by mouth. 01/19/17   Sharman Cheek, MD  omeprazole (PRILOSEC) 40 MG capsule Take 1 capsule (40 mg total) by mouth daily. Patient not taking: Reported on 12/18/2016 09/08/16 09/08/17  Jeanmarie Plant, MD  pantoprazole (PROTONIX) 40 MG tablet Take 1 tablet (40 mg total) by mouth daily. 02/12/18 02/12/19  Minna Antis, MD  potassium chloride SA (KLOR-CON M20) 20 MEQ tablet Take 1 tablet (20 mEq total) by mouth daily. 09/24/17   Loleta Rose, MD  predniSONE (DELTASONE) 10 MG tablet Take 5 tablets (50 mg  total) by mouth daily. Patient not taking: Reported on 12/18/2016 12/12/16   Governor Rooks, MD  sucralfate (CARAFATE) 1 g tablet Take 1 tablet (1 g total) by mouth 4 (four) times daily. 12/18/16 12/18/17  Governor Rooks, MD  sucralfate (CARAFATE) 1 g tablet Take 1 tablet (1 g total) 4 (four) times daily by mouth. 01/19/17   Sharman Cheek, MD    Allergies Other  Family History  Problem Relation Age of Onset  . Diabetes Mother   . Multiple sclerosis Brother   . Breast cancer Maternal Aunt   . Lung cancer Maternal Grandfather   . Stomach cancer Paternal Grandmother   . Colon cancer Maternal Uncle     Social History Social History   Tobacco Use  . Smoking status: Current Every Day Smoker    Packs/day: 0.25    Years: 10.00    Pack years: 2.50    Types: Cigarettes  . Smokeless tobacco: Never Used    Substance Use Topics  . Alcohol use: Yes  . Drug use: No    Review of Systems Constitutional: Positive for fever negative for chills ENT: Positive for sore throat. Cardiovascular: Positive for chest pain. Respiratory: Positive for cough Gastrointestinal: No abdominal pain.  No nausea, no vomiting.  No diarrhea.  No constipation.  Musculoskeletal: Negative for back pain. Neurological: Negative for headaches   ____________________________________________   PHYSICAL EXAM:  VITAL SIGNS: ED Triage Vitals  Enc Vitals Group     BP 03/02/18 0133 135/80     Pulse Rate 03/02/18 0133 (!) 103     Resp 03/02/18 0133 18     Temp 03/02/18 0133 98.6 F (37 C)     Temp Source 03/02/18 0133 Oral     SpO2 03/02/18 0133 97 %     Weight 03/02/18 0134 220 lb (99.8 kg)     Height 03/02/18 0134 5\' 9"  (1.753 m)     Head Circumference --      Peak Flow --      Pain Score 03/02/18 0134 10     Pain Loc --      Pain Edu? --      Excl. in GC? --     Constitutional: Alert and oriented x4 appears somewhat uncomfortable with a dry cough during my exam Head: Atraumatic. Nose: Positive for congestion Mouth/Throat: No trismus uvula midline no pharyngeal erythema or exudate Neck: No stridor.   Cardiovascular: Regular rate and rhythm Respiratory: Normal respiratory effort.  No retractions.  Clear to auscultation bilaterally Neurologic:  Normal speech and language. No gross focal neurologic deficits are appreciated.  Skin:  Skin is warm, dry and intact. No rash noted.    ____________________________________________  LABS (all labs ordered are listed, but only abnormal results are displayed)  Labs Reviewed - No data to display   __________________________________________  EKG   ____________________________________________  RADIOLOGY  Chest x-ray reviewed by me with no acute disease ____________________________________________   DIFFERENTIAL includes but not limited to  Upper  respiratory tract infection, pneumonia, pneumothorax, post viral syndrome, reactive airway disease   PROCEDURES  Procedure(s) performed: no  Procedures  Critical Care performed: no  ____________________________________________   INITIAL IMPRESSION / ASSESSMENT AND PLAN / ED COURSE  Pertinent labs & imaging results that were available during my care of the patient were reviewed by me and considered in my medical decision making (see chart for details).   As part of my medical decision making, I reviewed the following data within the electronic MEDICAL RECORD NUMBER  History obtained from family if available, nursing notes, old chart and ekg, as well as notes from prior ED visits.  The patient comes to the emergency department with 5 to 6 days of upper respiratory symptoms and now a persistent hacking nonproductive cough.  Given a DuoNeb here along with ibuprofen and dexamethasone with some improvement in his symptoms.  I discussed with the patient that her cough is typically the last symptom to go away.  I will prescribe him Atrovent nasal spray to help with the congestion along with ibuprofen and Tessalon Perles.  He feels relieved that his chest x-ray is negative for infiltrate.  No indication for antibiotics.  Strict return precautions have been given.      ____________________________________________   FINAL CLINICAL IMPRESSION(S) / ED DIAGNOSES  Final diagnoses:  Viral URI with cough  Post viral syndrome      NEW MEDICATIONS STARTED DURING THIS VISIT:  Discharge Medication List as of 03/02/2018  5:43 AM    START taking these medications   Details  ipratropium (ATROVENT) 0.06 % nasal spray Place 2 sprays into the nose 3 (three) times daily., Starting Thu 03/02/2018, Until Fri 03/02/2019, Print         Note:  This document was prepared using Dragon voice recognition software and may include unintentional dictation errors.     Merrily Brittleifenbark, Jadesola Poynter, MD 03/06/18 [id number]

## 2018-03-02 NOTE — ED Notes (Signed)
Pt. Going home by self. 

## 2018-05-30 ENCOUNTER — Other Ambulatory Visit: Payer: Self-pay

## 2018-05-30 ENCOUNTER — Emergency Department
Admission: EM | Admit: 2018-05-30 | Discharge: 2018-05-30 | Disposition: A | Discharge status: Home / Self Care | Payer: BLUE CROSS/BLUE SHIELD | Attending: Emergency Medicine | Admitting: Emergency Medicine

## 2018-05-30 DIAGNOSIS — J069 Acute upper respiratory infection, unspecified: Secondary | ICD-10-CM | POA: Insufficient documentation

## 2018-05-30 DIAGNOSIS — B9789 Other viral agents as the cause of diseases classified elsewhere: Secondary | ICD-10-CM | POA: Insufficient documentation

## 2018-05-30 DIAGNOSIS — Z79899 Other long term (current) drug therapy: Secondary | ICD-10-CM | POA: Insufficient documentation

## 2018-05-30 DIAGNOSIS — F17210 Nicotine dependence, cigarettes, uncomplicated: Secondary | ICD-10-CM | POA: Diagnosis not present

## 2018-05-30 DIAGNOSIS — I10 Essential (primary) hypertension: Secondary | ICD-10-CM | POA: Diagnosis not present

## 2018-05-30 DIAGNOSIS — R05 Cough: Secondary | ICD-10-CM | POA: Diagnosis present

## 2018-05-30 NOTE — ED Triage Notes (Signed)
Pt in with co cough for few days, denies any other symptoms. Job wants clearance to go back, to work.

## 2018-05-30 NOTE — ED Provider Notes (Signed)
[facility] Emergency Department Provider Note  Time seen: 9:52 PM  I have reviewed the triage vital signs and the nursing notes.   HISTORY  Chief Complaint Cough    HPI Douglas Mcdaniel is a 58 y.o. male with a past medical history of chronic abdominal pain, gastric reflux, hypertension, presents to the emergency department for cough and sneezing.  According to the patient for the past 2 to 3 days he has had a dry cough and has been sneezing with a mild sore throat.  Patient states he sneezed at work and they made him come to the emergency department to get evaluated.  Patient denies any fever at any point.  Patient overall appears very well currently afebrile in the emergency department.   Past Medical History:  Diagnosis Date  . Bilateral low back pain without sciatica 03/05/2014  . Bladder wall thickening 02/26/2014   Last Assessment & Plan:  - CT with bladder wall thickening possibly related to infection v. Neoplasm. - UA and urine cytology pending. - Urology referral for cystoscopy.  . Chronic abdominal pain 08/13/2015   Overview:  Chart Review:.  03/13/15- He underwent an exploratory laparotomy with all examined abdominal contents appearing normal including the entirety of the small bowel, appendix and colon, with no evidence of internal hernia. 03/17/15- CT: Segmental wall thickening in the proximal small bowel with mild dilatation, possibly enteritis, less likely obstruction. Correlate with recent laparoscopy.Smal  . 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 Anxiety. - Will continue to follow.  . Dental infection 02/26/2014  . Dyspepsia 01/15/2014   Last Assessment & Plan:  - Chronic, stable. - Refilled Pepcid.  . Elevated creatine kinase 07/17/2014   Last Assessment & Plan:  - Trending up 216 --> 330 --> 407 --> 295. 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  Other  differential includes inflammatory myopathy (dermatomyositis v. Polymyositis) v. v. Medication effect.  Unlikely hypothyroidism as normal TSH. - Denies alcohol and cocaine use. - Continue to monitor.  [name] Epidermal inclusion cyst 08/13/2015   Last Assessment & Plan:  Discussed excision of the cyst in the future. Patient is in agreement.   . Erectile dysfunction 04/11/2014  . Generalized anxiety disorder 01/15/2014   Last Assessment & Plan:  - Counseled on importance of re-starting Zoloft 50 mg once daily. - Concern that patient has been presenting to the ED multiple times for array of symptoms likely related to anxiety. - Encouraged to make an appointment at [facility] if patient feels like he needs to go to ED as we have same-day appointments available.  Patient in agreement. - Close follow-up at [facility] in one week.  [name] GERD (gastroesophageal reflux disease)   . Hemorrhoids 04/05/2014  . Hypertension   . Left inguinal hernia 03/05/2014  . Nonintractable episodic headache 07/17/2014   Last Assessment & Plan:  - Concern that patient has been presenting to the ED with 7 visits since 3/2 for similar symptoms.  Has had multiple rounds of antibiotics.  See chart review in subjective section. - Encouraged to make an appointment at [facility] if patient feels like he needs to go to ED as we have same-day appointments available.  Patient in agreement. - Recommend continuing Flonase and using   . Screening for diabetes mellitus 01/15/2014  . Tobacco use 09/16/2014  . Toe pain, right 04/05/2014  . Tonsillitis 02/12/2014    Patient Active Problem  List   Diagnosis Date Noted  . Hypertension 04/26/2016  . Chronic abdominal pain 08/13/2015  . Epidermal inclusion cyst 08/13/2015  . Tobacco use 09/16/2014  . 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 Elevated creatine kinase 07/17/2014  . Nonintractable episodic headache 07/17/2014  . Erectile dysfunction 04/11/2014  . Hemorrhoids 04/05/2014  . Toe pain, right 04/05/2014  . Bilateral low  back pain without sciatica 03/05/2014  . Left inguinal hernia 03/05/2014  . Bladder wall thickening 02/26/2014  . Dental infection 02/26/2014  . Tonsillitis 02/12/2014  . Dyspepsia 01/15/2014  . Generalized anxiety disorder 01/15/2014  . GERD (gastroesophageal reflux disease) 01/15/2014  . Screening for diabetes mellitus 01/15/2014    Past Surgical History:  Procedure Laterality Date  . COLONOSCOPY    . COLONOSCOPY W/ POLYPECTOMY  02/07/2015  . head surgery    . head surgery    . LAPAROSCOPY ABDOMEN DIAGNOSTIC  03/13/2015  . UPPER GASTROINTESTINAL ENDOSCOPY  04/29/2014    Prior to Admission medications   Medication Sig Start Date End Date Taking? Authorizing Provider  albuterol (PROVENTIL HFA;VENTOLIN HFA) 108 (90 Base) MCG/ACT inhaler Inhale 2 puffs into the lungs every 6 (six) hours as needed for wheezing or shortness of breath. Patient not taking: Reported on 12/18/2016 08/20/16   Darci Current, MD  albuterol (PROVENTIL HFA;VENTOLIN HFA) 108 (90 Base) MCG/ACT inhaler Inhale 2 puffs into the lungs every 6 (six) hours as needed for wheezing or shortness of breath. 12/12/16   Governor Rooks, MD  amLODipine (NORVASC) 10 MG tablet Take 1 tablet (10 mg total) by mouth daily. 09/08/16   Jeanmarie Plant, MD  azithromycin (ZITHROMAX) 250 MG tablet Take 1 tablet (250 mg total) by mouth daily. 06/02/17   Irean Hong, MD  benzonatate (TESSALON) 200 MG capsule Take 1 capsule (200 mg total) by mouth every 6 (six) hours as needed for cough. 03/02/18 03/02/19  Merrily Brittle, MD  famotidine (PEPCID) 20 MG tablet Take 20 mg by mouth 2 (two) times daily. 11/18/16   [provider]  famotidine (PEPCID) 20 MG tablet Take 1 tablet (20 mg total) 2 (two) times daily by mouth. 01/19/17   Sharman Cheek, MD  fluticasone [facility]) 50 MCG/ACT nasal spray Place 2 sprays into both nostrils daily. 02/23/16   Menshew, Charlesetta Ivory, PA-C  hydrochlorothiazide (HYDRODIURIL) 25 MG tablet Take 1 tablet  (25 mg total) by mouth daily. 09/08/16   Jeanmarie Plant, MD  HYDROcodone-acetaminophen (NORCO) 5-325 MG tablet Take 1 tablet every 6 (six) hours as needed by mouth for moderate pain. 01/12/17   Irean Hong, MD  ibuprofen (ADVIL,MOTRIN) 600 MG tablet Take 1 tablet (600 mg total) by mouth every 8 (eight) hours as needed. 03/02/18   Merrily Brittle, MD  ipratropium (ATROVENT) 0.06 % nasal spray Place 2 sprays into the nose 3 (three) times daily. 03/02/18 03/02/19  Merrily Brittle, MD  metoCLOPramide (REGLAN) 10 MG tablet Take 1 tablet (10 mg total) every 6 (six) hours as needed by mouth. 01/19/17   Sharman Cheek, MD  omeprazole (PRILOSEC) 40 MG capsule Take 1 capsule (40 mg total) by mouth daily. Patient not taking: Reported on 12/18/2016 09/08/16 09/08/17  Jeanmarie Plant, MD  pantoprazole (PROTONIX) 40 MG tablet Take 1 tablet (40 mg total) by mouth daily. 02/12/18 02/12/19  Minna Antis, MD  potassium chloride SA (KLOR-CON M20) 20 MEQ tablet Take 1 tablet (20 mEq total) by mouth daily. 09/24/17   Loleta Rose, MD  predniSONE (DELTASONE) 10 MG tablet Take 5  tablets (50 mg total) by mouth daily. Patient not taking: Reported on 12/18/2016 12/12/16   Governor Rooks, MD  sucralfate (CARAFATE) 1 g tablet Take 1 tablet (1 g total) by mouth 4 (four) times daily. 12/18/16 12/18/17  Governor Rooks, MD  sucralfate (CARAFATE) 1 g tablet Take 1 tablet (1 g total) 4 (four) times daily by mouth. 01/19/17   Sharman Cheek, MD    Allergies  Allergen Reactions  . Other Rash    Tomatoes-hives    Family History  Problem Relation Age of Onset  . Diabetes Mother   . Multiple sclerosis Brother   . Breast cancer Maternal Aunt   . Lung cancer Maternal Grandfather   . Stomach cancer Paternal Grandmother   . Colon cancer Maternal Uncle     Social History Social History   Tobacco Use  . Smoking status: Current Every Day Smoker    Packs/day: 0.25    Years: 10.00    Pack years: 2.50    Types: Cigarettes   . Smokeless tobacco: Never Used  Substance Use Topics  . Alcohol use: Yes  . Drug use: No    Review of Systems Constitutional: Negative for fever. ENT: Mild sore throat Cardiovascular: Negative for chest pain. Respiratory: Negative for shortness of breath.  Positive for cough. Gastrointestinal: Negative for abdominal pain, vomiting and diarrhea. Musculoskeletal: Negative for musculoskeletal complaints Skin: Negative for skin complaints  Neurological: Negative for headache All other ROS negative  ____________________________________________   PHYSICAL EXAM:  VITAL SIGNS: ED Triage Vitals  Enc Vitals Group     BP 05/30/18 2131 132/78     Pulse Rate 05/30/18 2131 95     Resp 05/30/18 2131 17     Temp 05/30/18 2131 98.4 F (36.9 C)     Temp Source 05/30/18 2131 Oral     SpO2 05/30/18 2131 96 %     Weight 05/30/18 2116 220 lb (99.8 kg)     Height 05/30/18 2116  (1.753 m)     Head Circumference --      Peak Flow --      Pain Score 05/30/18 2116 10     Pain Loc --      Pain Edu? --      Excl. in GC? --    Constitutional: Alert and oriented. Well appearing and in no distress. Eyes: Normal exam ENT   Head: Normocephalic and atraumatic.   Mouth/Throat: Mucous membranes are moist.  No pharyngeal erythema. Cardiovascular: Normal rate, regular rhythm. No murmur Respiratory: Normal respiratory effort without tachypnea nor retractions. Breath sounds are clear Gastrointestinal: Soft and nontender. No distention.   Musculoskeletal: Nontender with normal range of motion in all extremities.  Neurologic:  Normal speech and language. No gross focal neurologic deficits  Skin:  Skin is warm, dry and intact.  Psychiatric: Mood and affect are normal.   ____________________________________________   INITIAL IMPRESSION / ASSESSMENT AND PLAN / ED COURSE  Pertinent labs & imaging results that were available during my care of the patient were reviewed by me and considered in  my medical decision making (see chart for details).  Patient presents to the emergency department for cough and sneezing x 2 to 3 days.  Overall the patient appears extremely well, does have an occasional dry cough during examination.  Clear lung sounds.  Afebrile.  Suspect likely viral upper respiratory infection.  We will discharge with supportive care at home.  Patient agreeable to plan of care.  ____________________________________________   FINAL CLINICAL  IMPRESSION(S) / ED DIAGNOSES  Viral upper respiratory infection   Minna Antis, MD 05/30/18 2156

## 2018-06-24 ENCOUNTER — Other Ambulatory Visit: Payer: Self-pay

## 2018-06-24 DIAGNOSIS — F17210 Nicotine dependence, cigarettes, uncomplicated: Secondary | ICD-10-CM | POA: Insufficient documentation

## 2018-06-24 DIAGNOSIS — R51 Headache: Secondary | ICD-10-CM | POA: Diagnosis present

## 2018-06-24 DIAGNOSIS — Z79899 Other long term (current) drug therapy: Secondary | ICD-10-CM | POA: Diagnosis not present

## 2018-06-24 DIAGNOSIS — H6991 Unspecified Eustachian tube disorder, right ear: Secondary | ICD-10-CM | POA: Insufficient documentation

## 2018-06-24 DIAGNOSIS — J019 Acute sinusitis, unspecified: Secondary | ICD-10-CM | POA: Insufficient documentation

## 2018-06-24 DIAGNOSIS — I10 Essential (primary) hypertension: Secondary | ICD-10-CM | POA: Insufficient documentation

## 2018-06-24 NOTE — ED Triage Notes (Signed)
Patient reports having a headache, pain at right eye and pain at right ear.

## 2018-06-25 ENCOUNTER — Emergency Department
Admission: EM | Admit: 2018-06-25 | Discharge: 2018-06-25 | Disposition: A | Discharge status: Home / Self Care | Payer: BLUE CROSS/BLUE SHIELD | Attending: Emergency Medicine | Admitting: Emergency Medicine

## 2018-06-25 DIAGNOSIS — J019 Acute sinusitis, unspecified: Secondary | ICD-10-CM

## 2018-06-25 DIAGNOSIS — H6981 Other specified disorders of Eustachian tube, right ear: Secondary | ICD-10-CM

## 2018-06-25 MED ORDER — AMOXICILLIN-POT CLAVULANATE 875-125 MG PO TABS: 1.0000 | ORAL_TABLET | Freq: Two times a day (BID) | ORAL | 0 refills | Status: DC

## 2018-06-25 MED ORDER — MAGIC MOUTHWASH
10.0000 mL | Freq: Once | ORAL | Status: AC
  Administered 2018-06-25: 10 mL via ORAL
  Filled 2018-06-25: qty 10

## 2018-06-25 MED ORDER — MAGIC MOUTHWASH: 5.0000 mL | Freq: Three times a day (TID) | ORAL | 0 refills | Status: AC | PRN

## 2018-06-25 MED ORDER — DEXAMETHASONE SODIUM PHOSPHATE 10 MG/ML IJ SOLN
10.0000 mg | Freq: Once | INTRAMUSCULAR | Status: AC
  Administered 2018-06-25: 01:00:00 10 mg via INTRAMUSCULAR
  Filled 2018-06-25: qty 1

## 2018-06-25 MED ORDER — AMOXICILLIN-POT CLAVULANATE 875-125 MG PO TABS
1.0000 | ORAL_TABLET | Freq: Once | ORAL | Status: AC
  Administered 2018-06-25: 01:00:00 1 via ORAL
  Filled 2018-06-25: qty 1

## 2018-06-25 NOTE — ED Notes (Signed)
Patient requesting to speak to MD prior to discharge. MD informed. MD at bedside.

## 2018-06-25 NOTE — ED Provider Notes (Signed)
[facility] Emergency Department Provider Note   ____________________________________________   First MD Initiated Contact with Patient 06/25/18 [phone]     (approximate)  I have reviewed the triage vital signs and the nursing notes.   HISTORY  Chief Complaint Headache    HPI Douglas Mcdaniel is a 58 y.o. male who presents to the ED from home with a chief complaint of sinus pressure, right-sided headache, right ear pain and pain at his right eye.  States he has been having trouble with allergies and feels like sinusitis that he has had previously.  Denies associated fever, cough, chest pain, shortness of breath, abdominal pain, nausea or vomiting.  Denies recent travel, trauma or exposure to persons diagnosed with coronavirus.       Past Medical History:  Diagnosis Date  . Bilateral low back pain without sciatica 03/05/2014  . Bladder wall thickening 02/26/2014   Last Assessment & Plan:  - CT with bladder wall thickening possibly related to infection v. Neoplasm. - UA and urine cytology pending. - Urology referral for cystoscopy.  . Chronic abdominal pain 08/13/2015   Overview:  Chart Review:.  03/13/15- He underwent an exploratory laparotomy with all examined abdominal contents appearing normal including the entirety of the small bowel, appendix and colon, with no evidence of internal hernia. 03/17/15- CT: Segmental wall thickening in the proximal small bowel with mild dilatation, possibly enteritis, less likely obstruction. Correlate with recent laparoscopy.Smal  . 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 Anxiety. - Will continue to follow.  . Dental infection 02/26/2014  . Dyspepsia 01/15/2014   Last Assessment & Plan:  - Chronic, stable. - Refilled Pepcid.  . Elevated creatine kinase 07/17/2014   Last Assessment & Plan:  - Trending up 216 --> 330 --> 407 --> 295. - Possibly related to muscle strain and  dehydration with new job working outdoors.  Other differential includes inflammatory myopathy (dermatomyositis v. Polymyositis) v. v. Medication effect.  Unlikely hypothyroidism as normal TSH. - Denies alcohol and cocaine use. - Continue to monitor.  [name] Epidermal inclusion cyst 08/13/2015   Last Assessment & Plan:  Discussed excision of the cyst in the future. Patient is in agreement.   . Erectile dysfunction 04/11/2014  . Generalized anxiety disorder 01/15/2014   Last Assessment & Plan:  - Counseled on importance of re-starting Zoloft 50 mg once daily. - Concern that patient has been presenting to the ED multiple times for array of symptoms likely related to anxiety. - Encouraged to make an appointment at [facility] if patient feels like he needs to go to ED as we have same-day appointments available.  Patient in agreement. - Close follow-up at [facility] in one week.  [name] GERD (gastroesophageal reflux disease)   . Hemorrhoids 04/05/2014  . Hypertension   . Left inguinal hernia 03/05/2014  . Nonintractable episodic headache 07/17/2014   Last Assessment & Plan:  - Concern that patient has been presenting to the ED with 7 visits since 3/2 for similar symptoms.  Has had multiple rounds of antibiotics.  See chart review in subjective section. - Encouraged to make an appointment at [facility] if patient feels like he needs to go to ED as we have same-day appointments available.  Patient in agreement. - Recommend continuing Flonase and using   . Screening for diabetes mellitus 01/15/2014  . Tobacco use 09/16/2014  . Toe pain, right 04/05/2014  . Tonsillitis 02/12/2014    Patient Active  Problem List   Diagnosis Date Noted  . Hypertension 04/26/2016  . Chronic abdominal pain 08/13/2015  . Epidermal inclusion cyst 08/13/2015  . Tobacco use 09/16/2014  . 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 Elevated creatine kinase 07/17/2014  . Nonintractable episodic headache 07/17/2014  . Erectile dysfunction 04/11/2014  . Hemorrhoids 04/05/2014   . Toe pain, right 04/05/2014  . Bilateral low back pain without sciatica 03/05/2014  . Left inguinal hernia 03/05/2014  . Bladder wall thickening 02/26/2014  . Dental infection 02/26/2014  . Tonsillitis 02/12/2014  . Dyspepsia 01/15/2014  . Generalized anxiety disorder 01/15/2014  . GERD (gastroesophageal reflux disease) 01/15/2014  . Screening for diabetes mellitus 01/15/2014    Past Surgical History:  Procedure Laterality Date  . COLONOSCOPY    . COLONOSCOPY W/ POLYPECTOMY  02/07/2015  . head surgery    . head surgery    . LAPAROSCOPY ABDOMEN DIAGNOSTIC  03/13/2015  . UPPER GASTROINTESTINAL ENDOSCOPY  04/29/2014    Prior to Admission medications   Medication Sig Start Date End Date Taking? Authorizing Provider  albuterol (PROVENTIL HFA;VENTOLIN HFA) 108 (90 Base) MCG/ACT inhaler Inhale 2 puffs into the lungs every 6 (six) hours as needed for wheezing or shortness of breath. Patient not taking: Reported on 12/18/2016 08/20/16   Darci CurrentBrown, Quinton N, MD  albuterol (PROVENTIL HFA;VENTOLIN HFA) 108 (90 Base) MCG/ACT inhaler Inhale 2 puffs into the lungs every 6 (six) hours as needed for wheezing or shortness of breath. 12/12/16   Governor RooksLord, Rebecca, MD  amLODipine (NORVASC) 10 MG tablet Take 1 tablet (10 mg total) by mouth daily. 09/08/16   Jeanmarie PlantMcShane, James A, MD  amoxicillin-clavulanate (AUGMENTIN) 875-125 MG tablet Take 1 tablet by mouth 2 (two) times daily. 06/25/18   Irean HongSung,  J, MD  azithromycin (ZITHROMAX) 250 MG tablet Take 1 tablet (250 mg total) by mouth daily. 06/02/17   Irean HongSung,  J, MD  benzonatate (TESSALON) 200 MG capsule Take 1 capsule (200 mg total) by mouth every 6 (six) hours as needed for cough. 03/02/18 03/02/19  Merrily Brittleifenbark, Neil, MD  famotidine (PEPCID) 20 MG tablet Take 20 mg by mouth 2 (two) times daily. 11/18/16   [provider]  famotidine (PEPCID) 20 MG tablet Take 1 tablet (20 mg total) 2 (two) times daily by mouth. 01/19/17   Sharman CheekStafford, Phillip, MD  fluticasone  [facility](FLONASE) 50 MCG/ACT nasal spray Place 2 sprays into both nostrils daily. 02/23/16   Menshew, Charlesetta IvoryJenise V Bacon, PA-C  hydrochlorothiazide (HYDRODIURIL) 25 MG tablet Take 1 tablet (25 mg total) by mouth daily. 09/08/16   Jeanmarie PlantMcShane, James A, MD  HYDROcodone-acetaminophen (NORCO) 5-325 MG tablet Take 1 tablet every 6 (six) hours as needed by mouth for moderate pain. 01/12/17   Irean HongSung,  J, MD  ibuprofen (ADVIL,MOTRIN) 600 MG tablet Take 1 tablet (600 mg total) by mouth every 8 (eight) hours as needed. 03/02/18   Merrily Brittleifenbark, Neil, MD  ipratropium (ATROVENT) 0.06 % nasal spray Place 2 sprays into the nose 3 (three) times daily. 03/02/18 03/02/19  Merrily Brittleifenbark, Neil, MD  magic mouthwash SOLN Take 5 mLs by mouth 3 (three) times daily as needed for mouth pain. 06/25/18   Irean HongSung,  J, MD  metoCLOPramide (REGLAN) 10 MG tablet Take 1 tablet (10 mg total) every 6 (six) hours as needed by mouth. 01/19/17   Sharman CheekStafford, Phillip, MD  omeprazole (PRILOSEC) 40 MG capsule Take 1 capsule (40 mg total) by mouth daily. Patient not taking: Reported on 12/18/2016 09/08/16 09/08/17  Jeanmarie PlantMcShane, James A, MD  pantoprazole (PROTONIX) 40 MG  tablet Take 1 tablet (40 mg total) by mouth daily. 02/12/18 02/12/19  Minna Antis, MD  potassium chloride SA (KLOR-CON M20) 20 MEQ tablet Take 1 tablet (20 mEq total) by mouth daily. 09/24/17   Loleta Rose, MD  predniSONE (DELTASONE) 10 MG tablet Take 5 tablets (50 mg total) by mouth daily. Patient not taking: Reported on 12/18/2016 12/12/16   Governor Rooks, MD  sucralfate (CARAFATE) 1 g tablet Take 1 tablet (1 g total) by mouth 4 (four) times daily. 12/18/16 12/18/17  Governor Rooks, MD  sucralfate (CARAFATE) 1 g tablet Take 1 tablet (1 g total) 4 (four) times daily by mouth. 01/19/17   Sharman Cheek, MD    Allergies Other  Family History  Problem Relation Age of Onset  . Diabetes Mother   . Multiple sclerosis Brother   . Breast cancer Maternal Aunt   . Lung cancer Maternal Grandfather   .  Stomach cancer Paternal Grandmother   . Colon cancer Maternal Uncle     Social History Social History   Tobacco Use  . Smoking status: Current Every Day Smoker    Packs/day: 0.25    Years: 10.00    Pack years: 2.50    Types: Cigarettes  . Smokeless tobacco: Never Used  Substance Use Topics  . Alcohol use: Yes  . Drug use: No    Review of Systems  Constitutional: No fever/chills Eyes: No visual changes. ENT: Positive for right ear pain and sore throat. Cardiovascular: Denies chest pain. Respiratory: Denies shortness of breath. Gastrointestinal: No abdominal pain.  No nausea, no vomiting.  No diarrhea.  No constipation. Genitourinary: Negative for dysuria. Musculoskeletal: Negative for back pain. Skin: Negative for rash. Neurological: Positive for right headache.  Negative for focal weakness or numbness.   ____________________________________________   PHYSICAL EXAM:  VITAL SIGNS: ED Triage Vitals  Enc Vitals Group     BP 06/24/18 2342 125/81     Pulse Rate 06/24/18 2342 86     Resp 06/24/18 2342 16     Temp 06/24/18 2342 97.8 F (36.6 C)     Temp Source 06/24/18 2342 Oral     SpO2 06/24/18 2342 96 %     Weight --      Height --      Head Circumference --      Peak Flow --      Pain Score 06/24/18 2341 10     Pain Loc --      Pain Edu? --      Excl. in GC? --     Constitutional: Alert and oriented. Well appearing and in no acute distress. Eyes: Conjunctivae are normal. PERRL. EOMI. Head: Atraumatic.  Right forehead mildly tender to palpation. Ears: Right TM mildly erythematous with fluid, no perforation.  Left TM within normal limits. Nose: No congestion/rhinnorhea. Mouth/Throat: Mucous membranes are moist.  Oropharynx mildly erythematous with bilateral tonsillar swelling without exudates or peritonsillar abscess.  There is no hoarse or muffled voice.  There is no drooling. Neck: No stridor.  Supple neck without meningismus.  Hematological/Lymphatic/Immunilogical: No cervical lymphadenopathy. Cardiovascular: Normal rate, regular rhythm. Grossly normal heart sounds.  Good peripheral circulation. Respiratory: Normal respiratory effort.  No retractions. Lungs CTAB. Gastrointestinal: Soft and nontender. No distention. No abdominal bruits. No CVA tenderness. Musculoskeletal: No lower extremity tenderness nor edema.  No joint effusions. Neurologic:  Normal speech and language. No gross focal neurologic deficits are appreciated. No gait instability. Skin:  Skin is warm, dry and intact. No rash noted.  No petechiae. Psychiatric: Mood and affect are normal. Speech and behavior are normal.  ____________________________________________   LABS (all labs ordered are listed, but only abnormal results are displayed)  Labs Reviewed - No data to display ____________________________________________  EKG  None ____________________________________________  RADIOLOGY  ED MD interpretation: None  Official radiology report(s): No results found.  ____________________________________________   PROCEDURES  Procedure(s) performed (including Critical Care):  Procedures   ____________________________________________   INITIAL IMPRESSION / ASSESSMENT AND PLAN / ED COURSE  As part of my medical decision making, I reviewed the following data within the electronic MEDICAL RECORD NUMBER Nursing notes reviewed and incorporated, Old chart reviewed and Notes from prior ED visits     Douglas Mcdaniel was evaluated in Emergency Department on 06/25/2018 for the symptoms described in the history of present illness. He was evaluated in the context of the global COVID-19 pandemic, which necessitated consideration that the patient might be at risk for infection with the SARS-CoV-2 virus that causes COVID-19. Institutional protocols and algorithms that pertain to the evaluation of patients at risk for COVID-19 are in a state of rapid change  based on information released by regulatory bodies including the CDC and federal and state organizations. These policies and algorithms were followed during the patient's care in the ED.   58 year old male who presents with right forehead, eye and ear pain; history of sinusitis.  Will treat with IM Decadron, Augmentin, Magic mouthwash as needed.  Patient will follow-up with his PCP next week.  Strict return precautions given.  Patient verbalizes understanding agrees with plan of care.      ____________________________________________   FINAL CLINICAL IMPRESSION(S) / ED DIAGNOSES  Final diagnoses:  Acute non-recurrent sinusitis, unspecified location  Dysfunction of right eustachian tube     ED Discharge Orders         Ordered    amoxicillin-clavulanate (AUGMENTIN) 875-125 MG tablet  2 times daily     06/25/18 0049    magic mouthwash SOLN  3 times daily PRN     06/25/18 0049           Note:  This document was prepared using Dragon voice recognition software and may include unintentional dictation errors.   Irean Hong, MD 06/25/18 [phone]

## 2018-06-25 NOTE — Discharge Instructions (Signed)
1.  Take antibiotic as prescribed (Augmentin 875 mg twice daily x7 days). 2.  You may use Magic mouthwash as needed for throat discomfort. 3.  Return to the ER for worsening symptoms, persistent vomiting, difficulty breathing or other concerns. 

## 2018-06-25 NOTE — ED Notes (Signed)
Reviewed discharge instructions, follow-up care, and prescriptions with patient. Patient verbalized understanding of all information reviewed. Patient stable, with no distress noted at this time.    

## 2018-09-27 ENCOUNTER — Other Ambulatory Visit: Payer: Self-pay

## 2018-09-27 ENCOUNTER — Encounter: Payer: Self-pay | Admitting: Emergency Medicine

## 2018-09-27 ENCOUNTER — Emergency Department
Admission: EM | Admit: 2018-09-27 | Discharge: 2018-09-27 | Disposition: A | Discharge status: Home / Self Care | Payer: BC Managed Care – PPO | Attending: Emergency Medicine | Admitting: Emergency Medicine

## 2018-09-27 DIAGNOSIS — I10 Essential (primary) hypertension: Secondary | ICD-10-CM | POA: Insufficient documentation

## 2018-09-27 DIAGNOSIS — R1013 Epigastric pain: Secondary | ICD-10-CM | POA: Diagnosis not present

## 2018-09-27 DIAGNOSIS — K219 Gastro-esophageal reflux disease without esophagitis: Secondary | ICD-10-CM

## 2018-09-27 DIAGNOSIS — F17210 Nicotine dependence, cigarettes, uncomplicated: Secondary | ICD-10-CM | POA: Diagnosis not present

## 2018-09-27 DIAGNOSIS — M549 Dorsalgia, unspecified: Secondary | ICD-10-CM

## 2018-09-27 DIAGNOSIS — Z79899 Other long term (current) drug therapy: Secondary | ICD-10-CM | POA: Insufficient documentation

## 2018-09-27 MED ORDER — LIDOCAINE 5 % EX PTCH
1.0000 | MEDICATED_PATCH | CUTANEOUS | Status: DC
  Administered 2018-09-27: 1 via TRANSDERMAL
  Filled 2018-09-27: qty 1

## 2018-09-27 MED ORDER — LIDOCAINE VISCOUS HCL 2 % MT SOLN
15.0000 mL | Freq: Once | OROMUCOSAL | Status: AC
  Administered 2018-09-27: 15 mL via ORAL
  Filled 2018-09-27: qty 15

## 2018-09-27 MED ORDER — ACETAMINOPHEN 500 MG PO TABS
1000.0000 mg | ORAL_TABLET | Freq: Once | ORAL | Status: AC
  Administered 2018-09-27: 1000 mg via ORAL
  Filled 2018-09-27: qty 2

## 2018-09-27 MED ORDER — ALUM & MAG HYDROXIDE-SIMETH 200-200-20 MG/5ML PO SUSP
30.0000 mL | Freq: Once | ORAL | Status: AC
  Administered 2018-09-27: 30 mL via ORAL
  Filled 2018-09-27: qty 30

## 2018-09-27 MED ORDER — OMEPRAZOLE MAGNESIUM 20 MG PO TBEC: 20.0000 mg | DELAYED_RELEASE_TABLET | Freq: Every day | ORAL | 1 refills | Status: AC

## 2018-09-27 MED ORDER — LIDOCAINE 5 % EX PTCH: 1.0000 | MEDICATED_PATCH | Freq: Two times a day (BID) | CUTANEOUS | 0 refills | Status: AC

## 2018-09-27 NOTE — ED Notes (Signed)
ED Provider at bedside. 

## 2018-09-27 NOTE — ED Provider Notes (Signed)
[facility] Emergency Department Provider Note  ____________________________________________   First MD Initiated Contact with Patient 09/27/18 [phone]     (approximate)  I have reviewed the triage vital signs and the nursing notes.   HISTORY  Chief Complaint Back Pain    HPI Douglas Mcdaniel is a 58 y.o. male with medical history as listed below and with a history of frequent emergency department visits within the [facility] and [facility] systems.  He presents tonight for evaluation of back pain as well as acid reflux.  He did not initially offer the information, but when asked about his recent visits [facility] (once daily for the last 2 days), he admitted that these are the same symptoms for which he was [facility].  He says that his work involves lifting things and he may have pulled a muscle in his back.  He has some pain in the left side of the upper part of his back and is worse if he moves around to lift things.  He denies chest pain or shortness of breath.  He has some burning epigastric pain with some burping.  The symptoms in his back is been going on for a few days and he has opted to not take any over-the-counter medication at home, even after his 2 recent visits [facility].  He denies fever, sore throat, nausea, and vomiting.   He reports symptoms are moderate.        Past Medical History:  Diagnosis Date   Bilateral low back pain without sciatica 03/05/2014   Bladder wall thickening 02/26/2014   Last Assessment & Plan:  - CT with bladder wall thickening possibly related to infection v. Neoplasm. - UA and urine cytology pending. - Urology referral for cystoscopy.   Chronic abdominal pain 08/13/2015   Overview:  Chart Review:.  03/13/15- He underwent an exploratory laparotomy with all examined abdominal contents appearing normal including the entirety of the small bowel, appendix and colon, with no evidence of internal hernia. 03/17/15- CT: Segmental wall thickening  in the proximal small bowel with mild dilatation, possibly enteritis, less likely obstruction. 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 Anxiety. - Will continue to follow.   Dental infection 02/26/2014   Dyspepsia 01/15/2014   Last Assessment & Plan:  - Chronic, stable. - Refilled Pepcid.   Elevated creatine kinase 07/17/2014   Last Assessment & Plan:  - Trending up 216 --> 330 --> 407 --> 295. 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  Other differential includes inflammatory myopathy (dermatomyositis v. Polymyositis) v. v. Medication effect.  Unlikely hypothyroidism as normal TSH. - Denies alcohol and cocaine use. - Continue to monitor.   Epidermal inclusion cyst 08/13/2015   Last Assessment & Plan:  Discussed excision of the cyst in the future. Patient is in agreement.    Erectile dysfunction 04/11/2014   Generalized anxiety disorder 01/15/2014   Last Assessment & Plan:  - Counseled on importance of re-starting Zoloft 50 mg once daily. - Concern that patient has been presenting to the ED multiple times for array of symptoms likely related to anxiety. - Encouraged to [facility] if patient feels like he needs to go to ED as we have same-day appointments available.  Patient in agreement. - [facility] in one week.   GERD (gastroesophageal reflux disease)  Hemorrhoids 04/05/2014   Hypertension    Left inguinal hernia 03/05/2014   Nonintractable episodic headache 07/17/2014   Last Assessment & Plan:  - Concern that patient has been presenting to the ED with 7 visits since 3/2 for similar symptoms.  Has had multiple rounds of antibiotics.  See chart review in subjective section. - Encouraged to make an appointment at [facility] if patient feels like he needs to go to ED as we have same-day appointments available.  Patient in agreement. 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   Elevated creatine kinase 07/17/2014   Nonintractable episodic headache 07/17/2014   Erectile dysfunction 04/11/2014   Hemorrhoids 04/05/2014   Toe pain, right 04/05/2014   Bilateral low back pain without sciatica 03/05/2014   Left inguinal hernia 03/05/2014   Bladder wall thickening 02/26/2014   Dental infection 02/26/2014   Tonsillitis 02/12/2014   Dyspepsia 01/15/2014   Generalized anxiety disorder 01/15/2014   GERD (gastroesophageal reflux disease) 01/15/2014   Screening for diabetes mellitus 01/15/2014    Past Surgical History:  Procedure Laterality Date   COLONOSCOPY     COLONOSCOPY W/ POLYPECTOMY  02/07/2015   head surgery     head surgery     LAPAROSCOPY ABDOMEN DIAGNOSTIC  03/13/2015   UPPER GASTROINTESTINAL ENDOSCOPY  04/29/2014    Prior to Admission medications   Medication Sig Start Date End Date Taking? Authorizing Provider  albuterol (PROVENTIL HFA;VENTOLIN HFA) 108 (90 Base) MCG/ACT inhaler Inhale 2 puffs into the lungs every 6 (six) hours as needed for wheezing or shortness of breath. Patient not taking: Reported on 12/18/2016 08/20/16   Gregor Hams, MD  albuterol (PROVENTIL HFA;VENTOLIN HFA) 108 (90 Base) MCG/ACT inhaler Inhale 2 puffs into the lungs every 6 (six) hours as needed for wheezing or shortness of breath. 12/12/16   Lisa Roca, MD  amLODipine (NORVASC) 10 MG tablet Take 1 tablet (10 mg total) by mouth daily. 09/08/16   Schuyler Amor, MD  amoxicillin-clavulanate (AUGMENTIN) 875-125 MG tablet Take 1 tablet by mouth 2 (two) times daily.  06/25/18   Paulette Blanch, MD  azithromycin (ZITHROMAX) 250 MG tablet Take 1 tablet (250 mg total) by mouth daily. 06/02/17   Paulette Blanch, MD  benzonatate (TESSALON) 200 MG capsule Take 1 capsule (200 mg total) by mouth every 6 (six) hours as needed for cough. 03/02/18 03/02/19  Darel Hong, MD  famotidine (PEPCID) 20 MG tablet Take 20 mg by mouth 2 (two) times daily. 11/18/16   [provider]  famotidine (PEPCID) 20 MG tablet Take 1 tablet (20 mg total) 2 (two) times daily by mouth. 01/19/17   Carrie Mew, MD  fluticasone [facility]) 50 MCG/ACT nasal spray Place 2 sprays into both nostrils daily. 02/23/16   Menshew, Dannielle Karvonen, PA-C  hydrochlorothiazide (HYDRODIURIL) 25 MG tablet Take 1 tablet (25 mg total) by mouth daily. 09/08/16   Schuyler Amor, MD  HYDROcodone-acetaminophen (NORCO) 5-325 MG tablet Take 1 tablet every 6 (six) hours as needed by mouth for moderate pain. 01/12/17   Paulette Blanch, MD  ibuprofen (ADVIL,MOTRIN) 600 MG tablet Take 1 tablet (600 mg total) by mouth  every 8 (eight) hours as needed. 03/02/18   Merrily Brittleifenbark, Neil, MD  ipratropium (ATROVENT) 0.06 % nasal spray Place 2 sprays into the nose 3 (three) times daily. 03/02/18 03/02/19  Merrily Brittleifenbark, Neil, MD  lidocaine (LIDODERM) 5 % Place 1 patch onto the skin every 12 (twelve) hours. Remove & Discard patch within 12 hours or as directed by MD.  Wynelle FannyLeave the patch off for 12 hours before applying a new one. 09/27/18 09/27/19  Loleta RoseForbach, Anarosa Kubisiak, MD  magic mouthwash SOLN Take 5 mLs by mouth 3 (three) times daily as needed for mouth pain. 06/25/18   Irean HongSung, Jade J, MD  metoCLOPramide (REGLAN) 10 MG tablet Take 1 tablet (10 mg total) every 6 (six) hours as needed by mouth. 01/19/17   Sharman CheekStafford, Phillip, MD  omeprazole (PRILOSEC OTC) 20 MG tablet Take 1 tablet (20 mg total) by mouth daily. 09/27/18 09/27/19  Loleta RoseForbach, Jenney Brester, MD  omeprazole (PRILOSEC) 40 MG capsule Take 1 capsule (40 mg total) by mouth daily. Patient not taking: Reported on  12/18/2016 09/08/16 09/08/17  Jeanmarie PlantMcShane, James A, MD  pantoprazole (PROTONIX) 40 MG tablet Take 1 tablet (40 mg total) by mouth daily. 02/12/18 02/12/19  Minna AntisPaduchowski, Kevin, MD  potassium chloride SA (KLOR-CON M20) 20 MEQ tablet Take 1 tablet (20 mEq total) by mouth daily. 09/24/17   Loleta RoseForbach, Zaydrian Batta, MD  predniSONE (DELTASONE) 10 MG tablet Take 5 tablets (50 mg total) by mouth daily. Patient not taking: Reported on 12/18/2016 12/12/16   Governor RooksLord, Rebecca, MD  sucralfate (CARAFATE) 1 g tablet Take 1 tablet (1 g total) by mouth 4 (four) times daily. 12/18/16 12/18/17  Governor RooksLord, Rebecca, MD  sucralfate (CARAFATE) 1 g tablet Take 1 tablet (1 g total) 4 (four) times daily by mouth. 01/19/17   Sharman CheekStafford, Phillip, MD    Allergies Other  Family History  Problem Relation Age of Onset   Diabetes Mother    Multiple sclerosis Brother    Breast cancer Maternal Aunt    Lung cancer Maternal Grandfather    Stomach cancer Paternal Grandmother    Colon cancer Maternal Uncle     Social History Social History   Tobacco Use   Smoking status: Current Every Day Smoker    Packs/day: 0.25    Years: 10.00    Pack years: 2.50    Types: Cigarettes   Smokeless tobacco: Never Used  Substance Use Topics   Alcohol use: Yes   Drug use: No    Review of Systems Constitutional: No fever/chills Eyes: No visual changes. ENT: No sore throat. Cardiovascular: Denies chest pain. Respiratory: Denies shortness of breath. Gastrointestinal: Acid reflux Genitourinary: Negative for dysuria. Musculoskeletal: Upper back pain.  Integumentary: Negative for rash. Neurological: Negative for headaches, focal weakness or numbness.   ____________________________________________   PHYSICAL EXAM:  VITAL SIGNS: ED Triage Vitals  Enc Vitals Group     BP 09/27/18 0353 128/82     Pulse Rate 09/27/18 0353 72     Resp 09/27/18 0353 18     Temp 09/27/18 0353 97.8 F (36.6 C)     Temp Source 09/27/18 0353 Oral     SpO2 09/27/18  0353 97 %     Weight 09/27/18 0349 95.3 kg (210 lb)     Height 09/27/18 0349 1.753 m (5\' 9" )     Head Circumference --      Peak Flow --      Pain Score 09/27/18 0349 10     Pain Loc --      Pain Edu? --  Excl. in GC? --     Constitutional: Alert and oriented. Well appearing and in no acute distress. Eyes: Conjunctivae are normal.  Head: Atraumatic. Nose: No congestion/rhinnorhea. Mouth/Throat: Mucous membranes are moist. Neck: No stridor.  No meningeal signs.   Cardiovascular: Normal rate, regular rhythm. Good peripheral circulation. Grossly normal heart sounds. Respiratory: Normal respiratory effort.  No retractions. No audible wheezing. Gastrointestinal: Soft and nontender. No distention.  Musculoskeletal: Some mild reproducible tenderness to palpation of the muscles of the left upper back.  No fluctuance no induration or evidence of cellulitis nor vesicular rash. Neurologic:  Normal speech and language. No gross focal neurologic deficits are appreciated.  Skin:  Skin is warm, dry and intact. No rash noted. Psychiatric: Mood and affect are normal. Speech and behavior are normal.  ____________________________________________   LABS (all labs ordered are listed, but only abnormal results are displayed)  Labs Reviewed - No data to display ____________________________________________  EKG  ED ECG REPORT I, Loleta Roseory Torres Hardenbrook, the attending physician, personally viewed and interpreted this ECG.  Date: 09/27/2018 EKG Time: 3:59 AM Rate: 75 Rhythm: normal sinus rhythm QRS Axis: normal Intervals: normal ST/T Wave abnormalities: Non-specific ST segment / T-wave changes, but no clear evidence of acute ischemia. Narrative Interpretation: no definitive evidence of acute ischemia; does not meet STEMI criteria.   ____________________________________________  RADIOLOGY   ED MD interpretation: No indication for imaging  Official radiology report(s): No results  found.  ____________________________________________   PROCEDURES   Procedure(s) performed (including Critical Care):  Procedures   ____________________________________________   INITIAL IMPRESSION / MDM / ASSESSMENT AND PLAN / ED COURSE  As part of my medical decision making, I reviewed the following data within the electronic MEDICAL RECORD NUMBER Nursing notes reviewed and incorporated, Old chart reviewed, Notes from prior ED visits and  Controlled Substance Database   Differential diagnosis includes, but is not limited to, musculoskeletal strain, acid reflux, pneumonia, factitious disorder, conversion disorder, secondary gain.  The patient is in no distress.  He immediately agreed with his [facility] and the symptoms have not changed.  He admitted that he has not been taking any medication such as Tylenol for his pain.  I reviewed the medical record and he had extensive evaluations including CT scan lab work and a coronavirus swab, all of which were negative and reassuring.  Additionally, looking back through his records and his medical history, he has extensive history of frequent visits for various pain complaints and has in fact undergone exploratory laparoscopic surgery with no abnormalities identified when he was suffering from frequent visits for chronic abdominal pain  I provided a Lidoderm patch and a dose of Tylenol and recommended he take over-the-counter Tylenol at home.  I encouraged outpatient follow-up and gave my usual customary return precautions.  No indication for any further work-up.     ____________________________________________  FINAL CLINICAL IMPRESSION(S) / ED DIAGNOSES  Final diagnoses:  Musculoskeletal back pain  Gastroesophageal reflux disease, esophagitis presence not specified     MEDICATIONS GIVEN DURING THIS VISIT:  Medications  lidocaine (LIDODERM) 5 % 1 patch (1 patch Transdermal Patch Applied 09/27/18 0502)  alum & mag  hydroxide-simeth (MAALOX/MYLANTA) 200-200-20 MG/5ML suspension 30 mL (30 mLs Oral Given 09/27/18 0502)    And  lidocaine (XYLOCAINE) 2 % viscous mouth solution 15 mL (15 mLs Oral Given 09/27/18 0502)  acetaminophen (TYLENOL) tablet 1,000 mg (1,000 mg Oral Given 09/27/18 0502)     ED Discharge Orders  Ordered    lidocaine (LIDODERM) 5 %  Every 12 hours     09/27/18 0503    omeprazole (PRILOSEC OTC) 20 MG tablet  Daily     09/27/18 0503          *Please note:  Dastan Krider Ashworth was evaluated in Emergency Department on 09/27/2018 for the symptoms described in the history of present illness. He was evaluated in the context of the global COVID-19 pandemic, which necessitated consideration that the patient might be at risk for infection with the SARS-CoV-2 virus that causes COVID-19. Institutional protocols and algorithms that pertain to the evaluation of patients at risk for COVID-19 are in a state of rapid change based on information released by regulatory bodies including the CDC and federal and state organizations. These policies and algorithms were followed during the patient's care in the ED.  Some ED evaluations and interventions may be delayed as a result of limited staffing during the pandemic.*  Note:  This document was prepared using Dragon voice recognition software and may include unintentional dictation errors.   Loleta Rose, MD 09/27/18 [phone]

## 2018-09-27 NOTE — ED Notes (Addendum)
Patient states having back pain running across shoulders and goes down left side.  Patient states he has been using medications that was prescribed at [facility]. Patient denies and N/V/D. Patient states pain has been going on a week.

## 2018-09-27 NOTE — ED Triage Notes (Signed)
Patient ambulatory to triage with steady gait, without difficulty or distress noted, mask in place; pt reports several days having pain to upper back; denies any accomp symptoms, denies any known injury

## 2018-12-07 ENCOUNTER — Other Ambulatory Visit: Payer: Self-pay

## 2018-12-07 ENCOUNTER — Emergency Department: Payer: BC Managed Care – PPO

## 2018-12-07 ENCOUNTER — Emergency Department
Admission: EM | Admit: 2018-12-07 | Discharge: 2018-12-07 | Disposition: A | Discharge status: Home / Self Care | Payer: BC Managed Care – PPO | Attending: Emergency Medicine | Admitting: Emergency Medicine

## 2018-12-07 ENCOUNTER — Encounter: Payer: Self-pay | Admitting: Emergency Medicine

## 2018-12-07 DIAGNOSIS — F17210 Nicotine dependence, cigarettes, uncomplicated: Secondary | ICD-10-CM | POA: Diagnosis not present

## 2018-12-07 DIAGNOSIS — I10 Essential (primary) hypertension: Secondary | ICD-10-CM | POA: Diagnosis not present

## 2018-12-07 DIAGNOSIS — R1012 Left upper quadrant pain: Secondary | ICD-10-CM | POA: Diagnosis not present

## 2018-12-07 DIAGNOSIS — K2900 Acute gastritis without bleeding: Secondary | ICD-10-CM | POA: Diagnosis not present

## 2018-12-07 LAB — CBC WITH DIFFERENTIAL/PLATELET
Abs Immature Granulocytes: 0.02 10*3/uL (ref 0.00–0.07)
Basophils Absolute: 0 10*3/uL (ref 0.0–0.1)
Basophils Relative: 0 %
Eosinophils Absolute: 0.1 10*3/uL (ref 0.0–0.5)
Eosinophils Relative: 2 %
HCT: 49.1 % (ref 39.0–52.0)
Hemoglobin: 16.1 g/dL (ref 13.0–17.0)
Immature Granulocytes: 0 %
Lymphocytes Relative: 43 %
Lymphs Abs: 2.9 10*3/uL (ref 0.7–4.0)
MCH: 29.6 pg (ref 26.0–34.0)
MCHC: 32.8 g/dL (ref 30.0–36.0)
MCV: 90.3 fL (ref 80.0–100.0)
Monocytes Absolute: 0.7 10*3/uL (ref 0.1–1.0)
Monocytes Relative: 10 %
Neutro Abs: 3 10*3/uL (ref 1.7–7.7)
Neutrophils Relative %: 45 %
Platelets: 250 10*3/uL (ref 150–400)
RBC: 5.44 MIL/uL (ref 4.22–5.81)
RDW: 13.7 % (ref 11.5–15.5)
WBC: 6.8 10*3/uL (ref 4.0–10.5)
nRBC: 0 % (ref 0.0–0.2)

## 2018-12-07 LAB — COMPREHENSIVE METABOLIC PANEL
ALT: 27 U/L (ref 0–44)
AST: 28 U/L (ref 15–41)
Albumin: 4.1 g/dL (ref 3.5–5.0)
Alkaline Phosphatase: 50 U/L (ref 38–126)
Anion gap: 9 (ref 5–15)
BUN: 13 mg/dL (ref 6–20)
CO2: 27 mmol/L (ref 22–32)
Calcium: 8.9 mg/dL (ref 8.9–10.3)
Chloride: 103 mmol/L (ref 98–111)
Creatinine, Ser: 1.41 mg/dL — ABNORMAL HIGH (ref 0.61–1.24)
GFR calc Af Amer: >60 mL/min (ref >60)
GFR calc non Af Amer: 55 mL/min — ABNORMAL LOW (ref >60)
Glucose, Bld: 136 mg/dL — ABNORMAL HIGH (ref 70–99)
Potassium: 3.2 mmol/L — ABNORMAL LOW (ref 3.5–5.1)
Sodium: 139 mmol/L (ref 135–145)
Total Bilirubin: 0.5 mg/dL (ref 0.3–1.2)
Total Protein: 7.2 g/dL (ref 6.5–8.1)

## 2018-12-07 LAB — LIPASE, BLOOD: Lipase: 26 U/L (ref 11–51)

## 2018-12-07 LAB — URINALYSIS, COMPLETE (UACMP) WITH MICROSCOPIC
Bacteria, UA: NONE SEEN
Bilirubin Urine: NEGATIVE
Glucose, UA: NEGATIVE mg/dL
Hgb urine dipstick: NEGATIVE
Ketones, ur: NEGATIVE mg/dL
Leukocytes,Ua: NEGATIVE
Nitrite: NEGATIVE
Protein, ur: NEGATIVE mg/dL
Specific Gravity, Urine: 1.016 (ref 1.005–1.030)
Squamous Epithelial / HPF: NONE SEEN (ref 0–5)
pH: 6 (ref 5.0–8.0)

## 2018-12-07 LAB — TROPONIN I (HIGH SENSITIVITY)
Troponin I (High Sensitivity): 4 ng/L (ref <18)
Troponin I (High Sensitivity): 4 ng/L (ref <18)

## 2018-12-07 MED ORDER — LIDOCAINE VISCOUS HCL 2 % MT SOLN
15.0000 mL | Freq: Once | OROMUCOSAL | Status: AC
  Administered 2018-12-07: 15 mL via ORAL
  Filled 2018-12-07: qty 15

## 2018-12-07 MED ORDER — SODIUM CHLORIDE 0.9 % IV BOLUS
1000.0000 mL | Freq: Once | INTRAVENOUS | Status: AC
  Administered 2018-12-07: 1000 mL via INTRAVENOUS

## 2018-12-07 MED ORDER — ONDANSETRON HCL 4 MG/2ML IJ SOLN
4.0000 mg | Freq: Once | INTRAMUSCULAR | Status: AC
  Administered 2018-12-07: 4 mg via INTRAVENOUS
  Filled 2018-12-07: qty 2

## 2018-12-07 MED ORDER — ALUM & MAG HYDROXIDE-SIMETH 200-200-20 MG/5ML PO SUSP
30.0000 mL | Freq: Once | ORAL | Status: AC
  Administered 2018-12-07: 30 mL via ORAL
  Filled 2018-12-07: qty 30

## 2018-12-07 MED ORDER — FAMOTIDINE IN NACL 20-0.9 MG/50ML-% IV SOLN
20.0000 mg | Freq: Once | INTRAVENOUS | Status: AC
  Administered 2018-12-07: 20 mg via INTRAVENOUS
  Filled 2018-12-07: qty 50

## 2018-12-07 NOTE — ED Notes (Signed)
Pt to the er for gastritis symptoms. Pt says he has had gastritis for years. Pt reports that he drank 2 sodas today and it aggravated it. Pt reports pain above the umbilicus towards the left side. Pt has HTN which he takes meds for. Pt is unable to state what meds he takes for his gastritis.

## 2018-12-07 NOTE — ED Provider Notes (Signed)
[facility] Emergency Department Provider Note  ____________________________________________  Time seen: Approximately 5:07 AM  I have reviewed the triage vital signs and the nursing notes.   HISTORY  Chief Complaint Abdominal Pain   HPI Douglas Mcdaniel is a 58 y.o. male with a history of gastritis, GERD, anxiety, hypertension who presents for evaluation of abdominal pain.  Patient reports the pain started after he had rice, chicken, and shrimp and drink 2 sodas.  The pain is located in the left upper quadrant, he describes the quality as gas-like pain.  Initially the pain was severe but at this time he says it is mild and improving.  No chest pain or shortness of breath, no nausea or vomiting, no fever or chills, no cough, no diarrhea or constipation.  Patient is passing flatus.  Denies dysuria or hematuria.  Reports that the pain is identical to his prior episodes of gastritis.  Endorses occasional alcohol but none today.   Past Medical History:  Diagnosis Date  . Bilateral low back pain without sciatica 03/05/2014  . Bladder wall thickening 02/26/2014   Last Assessment & Plan:  - CT with bladder wall thickening possibly related to infection v. Neoplasm. - UA and urine cytology pending. - Urology referral for cystoscopy.  . Chronic abdominal pain 08/13/2015   Overview:  Chart Review:.  03/13/15- He underwent an exploratory laparotomy with all examined abdominal contents appearing normal including the entirety of the small bowel, appendix and colon, with no evidence of internal hernia. 03/17/15- CT: Segmental wall thickening in the proximal small bowel with mild dilatation, possibly enteritis, less likely obstruction. Correlate with recent laparoscopy.Smal  . 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 Anxiety. - Will continue to follow.  . Dental infection 02/26/2014  . Dyspepsia 01/15/2014   Last Assessment & Plan:  -  Chronic, stable. - Refilled Pepcid.  . Elevated creatine kinase 07/17/2014   Last Assessment & Plan:  - Trending up 216 --> 330 --> 407 --> 295. 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  Other differential includes inflammatory myopathy (dermatomyositis v. Polymyositis) v. v. Medication effect.  Unlikely hypothyroidism as normal TSH. - Denies alcohol and cocaine use. - Continue to monitor.  [name] Epidermal inclusion cyst 08/13/2015   Last Assessment & Plan:  Discussed excision of the cyst in the future. Patient is in agreement.   . Erectile dysfunction 04/11/2014  . Generalized anxiety disorder 01/15/2014   Last Assessment & Plan:  - Counseled on importance of re-starting Zoloft 50 mg once daily. - Concern that patient has been presenting to the ED multiple times for array of symptoms likely related to anxiety. - Encouraged to make an appointment at [facility] if patient feels like he needs to go to ED as we have same-day appointments available.  Patient in agreement. - Close follow-up at [facility] in one week.  [name] GERD (gastroesophageal reflux disease)   . Hemorrhoids 04/05/2014  . Hypertension   . Left inguinal hernia 03/05/2014  . Nonintractable episodic headache 07/17/2014   Last Assessment & Plan:  - Concern that patient has been presenting to the ED with 7 visits since 3/2 for similar symptoms.  Has had multiple rounds of antibiotics.  See chart review in subjective section. - Encouraged to make an appointment at [facility] if patient feels like he needs to go to ED as we have same-day appointments available.  Patient in agreement. -  Recommend continuing Flonase and using   . Screening for diabetes mellitus 01/15/2014  . Tobacco use 09/16/2014  . Toe pain, right 04/05/2014  . Tonsillitis 02/12/2014    Patient Active Problem List   Diagnosis Date Noted  . Hypertension 04/26/2016  . Chronic abdominal pain 08/13/2015  . Epidermal inclusion cyst 08/13/2015  . Tobacco use 09/16/2014   . 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 Elevated creatine kinase 07/17/2014  . Nonintractable episodic headache 07/17/2014  . Erectile dysfunction 04/11/2014  . Hemorrhoids 04/05/2014  . Toe pain, right 04/05/2014  . Bilateral low back pain without sciatica 03/05/2014  . Left inguinal hernia 03/05/2014  . Bladder wall thickening 02/26/2014  . Dental infection 02/26/2014  . Tonsillitis 02/12/2014  . Dyspepsia 01/15/2014  . Generalized anxiety disorder 01/15/2014  . GERD (gastroesophageal reflux disease) 01/15/2014  . Screening for diabetes mellitus 01/15/2014    Past Surgical History:  Procedure Laterality Date  . COLONOSCOPY    . COLONOSCOPY W/ POLYPECTOMY  02/07/2015  . head surgery    . head surgery    . LAPAROSCOPY ABDOMEN DIAGNOSTIC  03/13/2015  . UPPER GASTROINTESTINAL ENDOSCOPY  04/29/2014    Prior to Admission medications   Medication Sig Start Date End Date Taking? Authorizing Provider  albuterol (PROVENTIL HFA;VENTOLIN HFA) 108 (90 Base) MCG/ACT inhaler Inhale 2 puffs into the lungs every 6 (six) hours as needed for wheezing or shortness of breath. Patient not taking: Reported on 12/18/2016 08/20/16   Darci Current, MD  albuterol (PROVENTIL HFA;VENTOLIN HFA) 108 (90 Base) MCG/ACT inhaler Inhale 2 puffs into the lungs every 6 (six) hours as needed for wheezing or shortness of breath. 12/12/16   Governor Rooks, MD  amLODipine (NORVASC) 10 MG tablet Take 1 tablet (10 mg total) by mouth daily. 09/08/16   Jeanmarie Plant, MD  amoxicillin-clavulanate (AUGMENTIN) 875-125 MG tablet Take 1 tablet by mouth 2 (two) times daily. 06/25/18   Irean Hong, MD  azithromycin (ZITHROMAX) 250 MG tablet Take 1 tablet (250 mg total) by mouth daily. 06/02/17   Irean Hong, MD  benzonatate (TESSALON) 200 MG capsule Take 1 capsule (200 mg total) by mouth every 6 (six) hours as needed for cough. 03/02/18 03/02/19  Merrily Brittle, MD  famotidine (PEPCID) 20 MG tablet Take 20 mg by mouth 2 (two) times  daily. 11/18/16   [provider]  famotidine (PEPCID) 20 MG tablet Take 1 tablet (20 mg total) 2 (two) times daily by mouth. 01/19/17   Sharman Cheek, MD  fluticasone [facility]) 50 MCG/ACT nasal spray Place 2 sprays into both nostrils daily. 02/23/16   Menshew, Charlesetta Ivory, PA-C  hydrochlorothiazide (HYDRODIURIL) 25 MG tablet Take 1 tablet (25 mg total) by mouth daily. 09/08/16   Jeanmarie Plant, MD  HYDROcodone-acetaminophen (NORCO) 5-325 MG tablet Take 1 tablet every 6 (six) hours as needed by mouth for moderate pain. 01/12/17   Irean Hong, MD  ibuprofen (ADVIL,MOTRIN) 600 MG tablet Take 1 tablet (600 mg total) by mouth every 8 (eight) hours as needed. 03/02/18   Merrily Brittle, MD  ipratropium (ATROVENT) 0.06 % nasal spray Place 2 sprays into the nose 3 (three) times daily. 03/02/18 03/02/19  Merrily Brittle, MD  lidocaine (LIDODERM) 5 % Place 1 patch onto the skin every 12 (twelve) hours. Remove & Discard patch within 12 hours or as directed by MD.  Wynelle Fanny the patch off for 12 hours before applying a new one. 09/27/18 09/27/19  Loleta Rose, MD  magic mouthwash SOLN Take  5 mLs by mouth 3 (three) times daily as needed for mouth pain. 06/25/18   Paulette Blanch, MD  metoCLOPramide (REGLAN) 10 MG tablet Take 1 tablet (10 mg total) every 6 (six) hours as needed by mouth. 01/19/17   Carrie Mew, MD  omeprazole (PRILOSEC OTC) 20 MG tablet Take 1 tablet (20 mg total) by mouth daily. 09/27/18 09/27/19  Hinda Kehr, MD  omeprazole (PRILOSEC) 40 MG capsule Take 1 capsule (40 mg total) by mouth daily. Patient not taking: Reported on 12/18/2016 09/08/16 09/08/17  Schuyler Amor, MD  pantoprazole (PROTONIX) 40 MG tablet Take 1 tablet (40 mg total) by mouth daily. 02/12/18 02/12/19  Harvest Dark, MD  potassium chloride SA (KLOR-CON M20) 20 MEQ tablet Take 1 tablet (20 mEq total) by mouth daily. 09/24/17   Hinda Kehr, MD  predniSONE (DELTASONE) 10 MG tablet Take 5 tablets (50 mg total) by  mouth daily. Patient not taking: Reported on 12/18/2016 12/12/16   Lisa Roca, MD  sucralfate (CARAFATE) 1 g tablet Take 1 tablet (1 g total) by mouth 4 (four) times daily. 12/18/16 12/18/17  Lisa Roca, MD  sucralfate (CARAFATE) 1 g tablet Take 1 tablet (1 g total) 4 (four) times daily by mouth. 01/19/17   Carrie Mew, MD    Allergies Other  Family History  Problem Relation Age of Onset  . Diabetes Mother   . Multiple sclerosis Brother   . Breast cancer Maternal Aunt   . Lung cancer Maternal Grandfather   . Stomach cancer Paternal Grandmother   . Colon cancer Maternal Uncle     Social History Social History   Tobacco Use  . Smoking status: Current Every Day Smoker    Packs/day: 0.25    Years: 10.00    Pack years: 2.50    Types: Cigarettes  . Smokeless tobacco: Never Used  Substance Use Topics  . Alcohol use: Yes  . Drug use: No    Review of Systems  Constitutional: Negative for fever. Eyes: Negative for visual changes. ENT: Negative for sore throat. Neck: No neck pain  Cardiovascular: Negative for chest pain. Respiratory: Negative for shortness of breath. Gastrointestinal: + LUQ abdominal pain. No vomiting or diarrhea. Genitourinary: Negative for dysuria. Musculoskeletal: Negative for back pain. Skin: Negative for rash. Neurological: Negative for headaches, weakness or numbness. Psych: No SI or HI  ____________________________________________   PHYSICAL EXAM:  VITAL SIGNS: ED Triage Vitals [12/07/18 0151]  Enc Vitals Group     BP (!) 144/69     Pulse Rate 89     Resp 18     Temp 98.1 F (36.7 C)     Temp Source Oral     SpO2 97 %     Weight 210 lb (95.3 kg)     Height 5\' 9"  (1.753 m)     Head Circumference      Peak Flow      Pain Score 10     Pain Loc      Pain Edu?      Excl. in [place]?     Constitutional: Alert and oriented. Well appearing and in no apparent distress. HEENT:      Head: Normocephalic and atraumatic.         Eyes:  Conjunctivae are normal. Sclera is non-icteric.       Mouth/Throat: Mucous membranes are moist.       Neck: Supple with no signs of meningismus. Cardiovascular: Regular rate and rhythm. No murmurs, gallops, or rubs. 2+ symmetrical distal  pulses are present in all extremities. No JVD. Respiratory: Normal respiratory effort. Lungs are clear to auscultation bilaterally. No wheezes, crackles, or rhonchi.  Gastrointestinal: Soft, tender to palpation the left upper quadrant, and non distended with positive bowel sounds. No rebound or guarding. Musculoskeletal: Nontender with normal range of motion in all extremities. No edema, cyanosis, or erythema of extremities. Neurologic: Normal speech and language. Face is symmetric. Moving all extremities. No gross focal neurologic deficits are appreciated. Skin: Skin is warm, dry and intact. No rash noted. Psychiatric: Mood and affect are normal. Speech and behavior are normal.  ____________________________________________   LABS (all labs ordered are listed, but only abnormal results are displayed)  Labs Reviewed  COMPREHENSIVE METABOLIC PANEL - Abnormal; Notable for the following components:      Result Value   Potassium 3.2 (*)    Glucose, Bld 136 (*)    Creatinine, Ser 1.41 (*)    GFR calc non Af Amer 55 (*)    All other components within normal limits  URINALYSIS, COMPLETE (UACMP) WITH MICROSCOPIC - Abnormal; Notable for the following components:   Color, Urine STRAW (*)    APPearance CLEAR (*)    All other components within normal limits  CBC WITH DIFFERENTIAL/PLATELET  LIPASE, BLOOD  TROPONIN I (HIGH SENSITIVITY)  TROPONIN I (HIGH SENSITIVITY)   ____________________________________________  EKG  ED ECG REPORT I, Nita Sickle, the attending physician, personally viewed and interpreted this ECG.  Normal sinus rhythm, rate of 90, normal intervals, normal axis, no ST elevations or depressions.  ____________________________________________  RADIOLOGY  I have personally reviewed the images performed during this visit and I agree with the Radiologist's read.   Interpretation by Radiologist:  Dg Abdomen 1 View  Result Date: 12/07/2018 CLINICAL DATA:  58 year old male with left upper quadrant abdominal pain. EXAM: ABDOMEN - 1 VIEW COMPARISON:  CT Abdomen and Pelvis 04/04/2017 and earlier. FINDINGS: Two supine views at 0341 hours. Normal visible lung bases. Non obstructed bowel gas pattern. Normal abdominal and pelvic visceral contours. No pneumoperitoneum evident on these supine views. No acute osseous abnormality identified. IMPRESSION: Negative. Electronically Signed   By: Odessa Fleming M.D.   On: 12/07/2018 03:54     ____________________________________________   PROCEDURES  Procedure(s) performed: None Procedures Critical Care performed:  None ____________________________________________   INITIAL IMPRESSION / ASSESSMENT AND PLAN / ED COURSE  58 y.o. male with a history of gastritis, GERD, anxiety, hypertension who presents for evaluation of LUQ abdominal pain that started after eating his evening.  ddx gastritis, PUD, pancreatitis, GB pathology.  Patient is well-appearing and in no distress with normal vitals, abdomen is soft with no distention, positive bowel sounds, tenderness palpation of the left upper quadrant and no rebound or guarding.  EKG and troponin x 2, low suspicion for ACS.  Labs showing normal CMP, lipase, no leukocytosis.  UA negative for UTI or blood.  KUB showing no acute findings.  Patient was given IV Pepcid, Zofran, GI cocktail with full resolution of his symptoms.  Patient was discharged home to follow-up with his primary care doctor.  Discussed my standard return precautions.  Recommended to continue taking Pepcid.       As part of my medical decision making, I reviewed the following data within the electronic MEDICAL RECORD NUMBER Nursing notes  reviewed and incorporated, Labs reviewed , EKG interpreted , Old EKG reviewed, Old chart reviewed, Radiograph reviewed , Notes from prior ED visits and [place] Controlled Substance Database   Patient was evaluated in  Emergency Department today for the symptoms described in the history of present illness. Patient was evaluated in the context of the global COVID-19 pandemic, which necessitated consideration that the patient might be at risk for infection with the SARS-CoV-2 virus that causes COVID-19. Institutional protocols and algorithms that pertain to the evaluation of patients at risk for COVID-19 are in a state of rapid change based on information released by regulatory bodies including the CDC and federal and state organizations. These policies and algorithms were followed during the patient's care in the ED.   ____________________________________________   FINAL CLINICAL IMPRESSION(S) / ED DIAGNOSES   Final diagnoses:  LUQ abdominal pain  Acute gastritis without hemorrhage, unspecified gastritis type      NEW MEDICATIONS STARTED DURING THIS VISIT:  ED Discharge Orders    None       Note:  This document was prepared using Dragon voice recognition software and may include unintentional dictation errors.    Nita SickleVeronese, [place], MD 12/07/18 [id number]

## 2018-12-07 NOTE — ED Triage Notes (Signed)
Patient ambulatory to triage with steady gait, without difficulty or distress noted; pt reports upper abd pain tonight; denies hx of same, denies any accomp symptoms

## 2019-01-01 ENCOUNTER — Emergency Department
Admission: EM | Admit: 2019-01-01 | Discharge: 2019-01-01 | Disposition: A | Discharge status: Home / Self Care | Payer: BC Managed Care – PPO | Attending: Emergency Medicine | Admitting: Emergency Medicine

## 2019-01-01 ENCOUNTER — Emergency Department: Payer: BC Managed Care – PPO

## 2019-01-01 ENCOUNTER — Other Ambulatory Visit: Payer: Self-pay

## 2019-01-01 DIAGNOSIS — K219 Gastro-esophageal reflux disease without esophagitis: Secondary | ICD-10-CM | POA: Diagnosis not present

## 2019-01-01 DIAGNOSIS — R079 Chest pain, unspecified: Secondary | ICD-10-CM | POA: Diagnosis not present

## 2019-01-01 DIAGNOSIS — F17210 Nicotine dependence, cigarettes, uncomplicated: Secondary | ICD-10-CM | POA: Insufficient documentation

## 2019-01-01 DIAGNOSIS — I10 Essential (primary) hypertension: Secondary | ICD-10-CM | POA: Diagnosis not present

## 2019-01-01 DIAGNOSIS — Z79899 Other long term (current) drug therapy: Secondary | ICD-10-CM | POA: Insufficient documentation

## 2019-01-01 LAB — CBC
HCT: 49.8 % (ref 39.0–52.0)
Hemoglobin: 16.4 g/dL (ref 13.0–17.0)
MCH: 29.4 pg (ref 26.0–34.0)
MCHC: 32.9 g/dL (ref 30.0–36.0)
MCV: 89.2 fL (ref 80.0–100.0)
Platelets: 267 10*3/uL (ref 150–400)
RBC: 5.58 MIL/uL (ref 4.22–5.81)
RDW: 13.5 % (ref 11.5–15.5)
WBC: 6.3 10*3/uL (ref 4.0–10.5)
nRBC: 0 % (ref 0.0–0.2)

## 2019-01-01 LAB — HEPATIC FUNCTION PANEL
ALT: 33 U/L (ref 0–44)
AST: 26 U/L (ref 15–41)
Albumin: 4.3 g/dL (ref 3.5–5.0)
Alkaline Phosphatase: 39 U/L (ref 38–126)
Bilirubin, Direct: 0.2 mg/dL (ref 0.0–0.2)
Indirect Bilirubin: 0.5 mg/dL (ref 0.3–0.9)
Total Bilirubin: 0.7 mg/dL (ref 0.3–1.2)
Total Protein: 7.5 g/dL (ref 6.5–8.1)

## 2019-01-01 LAB — BASIC METABOLIC PANEL
Anion gap: 10 (ref 5–15)
BUN: 12 mg/dL (ref 6–20)
CO2: 29 mmol/L (ref 22–32)
Calcium: 9.2 mg/dL (ref 8.9–10.3)
Chloride: 100 mmol/L (ref 98–111)
Creatinine, Ser: 0.89 mg/dL (ref 0.61–1.24)
GFR calc Af Amer: >60 mL/min (ref >60)
GFR calc non Af Amer: >60 mL/min (ref >60)
Glucose, Bld: 129 mg/dL — ABNORMAL HIGH (ref 70–99)
Potassium: 3.4 mmol/L — ABNORMAL LOW (ref 3.5–5.1)
Sodium: 139 mmol/L (ref 135–145)

## 2019-01-01 LAB — TROPONIN I (HIGH SENSITIVITY)
Troponin I (High Sensitivity): 4 ng/L (ref <18)
Troponin I (High Sensitivity): 3 ng/L (ref <18)

## 2019-01-01 LAB — LIPASE, BLOOD: Lipase: 20 U/L (ref 11–51)

## 2019-01-01 MED ORDER — LIDOCAINE VISCOUS HCL 2 % MT SOLN
15.0000 mL | Freq: Once | OROMUCOSAL | Status: AC
  Administered 2019-01-01: 15 mL via ORAL
  Filled 2019-01-01: qty 15

## 2019-01-01 MED ORDER — ALUM & MAG HYDROXIDE-SIMETH 200-200-20 MG/5ML PO SUSP
15.0000 mL | Freq: Once | ORAL | Status: AC
  Administered 2019-01-01: 15 mL via ORAL
  Filled 2019-01-01: qty 30

## 2019-01-01 MED ORDER — LIDOCAINE VISCOUS HCL 2 % MT SOLN: 15.0000 mL | OROMUCOSAL | 0 refills | Status: AC | PRN

## 2019-01-01 NOTE — ED Notes (Signed)
Dr Jessup in to follow up 

## 2019-01-01 NOTE — ED Notes (Signed)
Pt to xray

## 2019-01-01 NOTE — ED Provider Notes (Signed)
[facility] Emergency Department Provider Note   ____________________________________________   First MD Initiated Contact with Patient 01/01/19 [phone]     (approximate)  I have reviewed the triage vital signs and the nursing notes.   HISTORY  Chief Complaint Chest Pain    HPI Douglas Mcdaniel is a 58 y.o. male with past medical history of hypertension and GERD presents to the ED complaining of chest and upper abdominal pain.  Patient reports he has had constant "bubbling" and discomfort in his upper abdomen and chest since yesterday.  Symptoms seem to move from his upper abdomen upwards into his chest and he describes frequent belching as well as a "bad taste" in his mouth.  He states his chest is not bothering him as much now, but his upper abdomen continues to be uncomfortable.  He denies any fevers, cough, or shortness of breath.  He states he takes omeprazole regularly, but that his acid reflux continues to bother him.        Past Medical History:  Diagnosis Date  . Bilateral low back pain without sciatica 03/05/2014  . Bladder wall thickening 02/26/2014   Last Assessment & Plan:  - CT with bladder wall thickening possibly related to infection v. Neoplasm. - UA and urine cytology pending. - Urology referral for cystoscopy.  . Chronic abdominal pain 08/13/2015   Overview:  Chart Review:.  03/13/15- He underwent an exploratory laparotomy with all examined abdominal contents appearing normal including the entirety of the small bowel, appendix and colon, with no evidence of internal hernia. 03/17/15- CT: Segmental wall thickening in the proximal small bowel with mild dilatation, possibly enteritis, less likely obstruction. Correlate with recent laparoscopy.Smal  . 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 Anxiety. - Will continue to follow.  . Dental infection 02/26/2014  . Dyspepsia 01/15/2014   Last Assessment &  Plan:  - Chronic, stable. - Refilled Pepcid.  . Elevated creatine kinase 07/17/2014   Last Assessment & Plan:  - Trending up 216 --> 330 --> 407 --> 295. 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  Other differential includes inflammatory myopathy (dermatomyositis v. Polymyositis) v. v. Medication effect.  Unlikely hypothyroidism as normal TSH. - Denies alcohol and cocaine use. - Continue to monitor.  [name]. Epidermal inclusion cyst 08/13/2015   Last Assessment & Plan:  Discussed excision of the cyst in the future. Patient is in agreement.   . Erectile dysfunction 04/11/2014  . Generalized anxiety disorder 01/15/2014   Last Assessment & Plan:  - Counseled on importance of re-starting Zoloft 50 mg once daily. - Concern that patient has been presenting to the ED multiple times for array of symptoms likely related to anxiety. - [facility] if patient feels like he needs to go to ED as we have same-day appointments available.  Patient in agreement. - [facility].  [name]. GERD (gastroesophageal reflux disease)   . Hemorrhoids 04/05/2014  . Hypertension   . Left inguinal hernia 03/05/2014  . Nonintractable episodic headache 07/17/2014   Last Assessment & Plan:  - Concern that patient has been presenting to the ED with 7 visits since 3/2 for similar symptoms.  Has had multiple rounds of antibiotics.  See chart review in subjective section. - [facility]our[facility] to [facility] if patient feels like he needs to go to ED as we  have same-day appointments available.  Patient in agreement. - Recommend continuing Flonase and using   . Screening for diabetes mellitus 01/15/2014  . Tobacco use 09/16/2014  . Toe pain, right 04/05/2014  . Tonsillitis 02/12/2014    Patient Active Problem List   Diagnosis Date Noted  . Hypertension 04/26/2016  . Chronic abdominal pain 08/13/2015  . Epidermal inclusion cyst 08/13/2015  . Tobacco use  09/16/2014  . 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 Elevated creatine kinase 07/17/2014  . Nonintractable episodic headache 07/17/2014  . Erectile dysfunction 04/11/2014  . Hemorrhoids 04/05/2014  . Toe pain, right 04/05/2014  . Bilateral low back pain without sciatica 03/05/2014  . Left inguinal hernia 03/05/2014  . Bladder wall thickening 02/26/2014  . Dental infection 02/26/2014  . Tonsillitis 02/12/2014  . Dyspepsia 01/15/2014  . Generalized anxiety disorder 01/15/2014  . GERD (gastroesophageal reflux disease) 01/15/2014  . Screening for diabetes mellitus 01/15/2014    Past Surgical History:  Procedure Laterality Date  . COLONOSCOPY    . COLONOSCOPY W/ POLYPECTOMY  02/07/2015  . head surgery    . head surgery    . LAPAROSCOPY ABDOMEN DIAGNOSTIC  03/13/2015  . UPPER GASTROINTESTINAL ENDOSCOPY  04/29/2014    Prior to Admission medications   Medication Sig Start Date End Date Taking? Authorizing Provider  albuterol (PROVENTIL HFA;VENTOLIN HFA) 108 (90 Base) MCG/ACT inhaler Inhale 2 puffs into the lungs every 6 (six) hours as needed for wheezing or shortness of breath. Patient not taking: Reported on 12/18/2016 08/20/16   Darci Current, MD  albuterol (PROVENTIL HFA;VENTOLIN HFA) 108 (90 Base) MCG/ACT inhaler Inhale 2 puffs into the lungs every 6 (six) hours as needed for wheezing or shortness of breath. 12/12/16   Governor Rooks, MD  amLODipine (NORVASC) 10 MG tablet Take 1 tablet (10 mg total) by mouth daily. 09/08/16   Jeanmarie Plant, MD  amoxicillin-clavulanate (AUGMENTIN) 875-125 MG tablet Take 1 tablet by mouth 2 (two) times daily. 06/25/18   Irean Hong, MD  azithromycin (ZITHROMAX) 250 MG tablet Take 1 tablet (250 mg total) by mouth daily. 06/02/17   Irean Hong, MD  benzonatate (TESSALON) 200 MG capsule Take 1 capsule (200 mg total) by mouth every 6 (six) hours as needed for cough. 03/02/18 03/02/19  Merrily Brittle, MD  famotidine (PEPCID) 20 MG tablet Take 20 mg by mouth 2  (two) times daily. 11/18/16   [provider]  famotidine (PEPCID) 20 MG tablet Take 1 tablet (20 mg total) 2 (two) times daily by mouth. 01/19/17   Sharman Cheek, MD  fluticasone [facility]) 50 MCG/ACT nasal spray Place 2 sprays into both nostrils daily. 02/23/16   Menshew, Charlesetta Ivory, PA-C  hydrochlorothiazide (HYDRODIURIL) 25 MG tablet Take 1 tablet (25 mg total) by mouth daily. 09/08/16   Jeanmarie Plant, MD  HYDROcodone-acetaminophen (NORCO) 5-325 MG tablet Take 1 tablet every 6 (six) hours as needed by mouth for moderate pain. 01/12/17   Irean Hong, MD  ibuprofen (ADVIL,MOTRIN) 600 MG tablet Take 1 tablet (600 mg total) by mouth every 8 (eight) hours as needed. 03/02/18   Merrily Brittle, MD  ipratropium (ATROVENT) 0.06 % nasal spray Place 2 sprays into the nose 3 (three) times daily. 03/02/18 03/02/19  Merrily Brittle, MD  lidocaine (LIDODERM) 5 % Place 1 patch onto the skin every 12 (twelve) hours. Remove & Discard patch within 12 hours or as directed by MD.  Wynelle Fanny the patch off for 12 hours before applying a new one. 09/27/18 09/27/19  Loleta Rose, MD  lidocaine (XYLOCAINE) 2 % solution Use as directed 15 mLs in the mouth or throat as needed for mouth pain. 01/01/19   Chesley Noon, MD  magic mouthwash SOLN Take 5 mLs by mouth 3 (three) times daily as needed for mouth pain. 06/25/18   Irean Hong, MD  metoCLOPramide (REGLAN) 10 MG tablet Take 1 tablet (10 mg total) every 6 (six) hours as needed by mouth. 01/19/17   Sharman Cheek, MD  omeprazole (PRILOSEC OTC) 20 MG tablet Take 1 tablet (20 mg total) by mouth daily. 09/27/18 09/27/19  Loleta Rose, MD  omeprazole (PRILOSEC) 40 MG capsule Take 1 capsule (40 mg total) by mouth daily. Patient not taking: Reported on 12/18/2016 09/08/16 09/08/17  Jeanmarie Plant, MD  pantoprazole (PROTONIX) 40 MG tablet Take 1 tablet (40 mg total) by mouth daily. 02/12/18 02/12/19  Minna Antis, MD  potassium chloride SA (KLOR-CON M20) 20 MEQ  tablet Take 1 tablet (20 mEq total) by mouth daily. 09/24/17   Loleta Rose, MD  predniSONE (DELTASONE) 10 MG tablet Take 5 tablets (50 mg total) by mouth daily. Patient not taking: Reported on 12/18/2016 12/12/16   Governor Rooks, MD  sucralfate (CARAFATE) 1 g tablet Take 1 tablet (1 g total) by mouth 4 (four) times daily. 12/18/16 12/18/17  Governor Rooks, MD  sucralfate (CARAFATE) 1 g tablet Take 1 tablet (1 g total) 4 (four) times daily by mouth. 01/19/17   Sharman Cheek, MD    Allergies Other  Family History  Problem Relation Age of Onset  . Diabetes Mother   . Multiple sclerosis Brother   . Breast cancer Maternal Aunt   . Lung cancer Maternal Grandfather   . Stomach cancer Paternal Grandmother   . Colon cancer Maternal Uncle     Social History Social History   Tobacco Use  . Smoking status: Current Every Day Smoker    Packs/day: 0.25    Years: 10.00    Pack years: 2.50    Types: Cigarettes  . Smokeless tobacco: Never Used  Substance Use Topics  . Alcohol use: Yes  . Drug use: No    Review of Systems  Constitutional: No fever/chills Eyes: No visual changes. ENT: No sore throat. Cardiovascular: Positive for chest pain. Respiratory: Denies shortness of breath. Gastrointestinal: Positive for abdominal pain.  No nausea, no vomiting.  No diarrhea.  No constipation. Genitourinary: Negative for dysuria. Musculoskeletal: Negative for back pain. Skin: Negative for rash. Neurological: Negative for headaches, focal weakness or numbness.  ____________________________________________   PHYSICAL EXAM:  VITAL SIGNS: ED Triage Vitals  Enc Vitals Group     BP 01/01/19 0251 134/90     Pulse Rate 01/01/19 0251 (!) 58     Resp 01/01/19 0251 16     Temp 01/01/19 0251 98.6 F (37 C)     Temp Source 01/01/19 0251 Oral     SpO2 01/01/19 0251 99 %     Weight 01/01/19 0252 210 lb (95.3 kg)     Height 01/01/19 0252 5\' 9"  (1.753 m)     Head Circumference --      Peak Flow  --      Pain Score 01/01/19 0251 10     Pain Loc --      Pain Edu? --      Excl. in GC? --     Constitutional: Alert and oriented. Eyes: Conjunctivae are normal. Head: Atraumatic. Nose: No congestion/rhinnorhea. Mouth/Throat: Mucous membranes are moist. Neck: Normal ROM Cardiovascular: Normal  rate, regular rhythm. Grossly normal heart sounds. Respiratory: Normal respiratory effort.  No retractions. Lungs CTAB. Gastrointestinal: Soft and nontender. No distention. Genitourinary: deferred Musculoskeletal: No lower extremity tenderness nor edema. Neurologic:  Normal speech and language. No gross focal neurologic deficits are appreciated. Skin:  Skin is warm, dry and intact. No rash noted. Psychiatric: Mood and affect are normal. Speech and behavior are normal.  ____________________________________________   LABS (all labs ordered are listed, but only abnormal results are displayed)  Labs Reviewed  BASIC METABOLIC PANEL - Abnormal; Notable for the following components:      Result Value   Potassium 3.4 (*)    Glucose, Bld 129 (*)    All other components within normal limits  CBC  HEPATIC FUNCTION PANEL  LIPASE, BLOOD  TROPONIN I (HIGH SENSITIVITY)  TROPONIN I (HIGH SENSITIVITY)   ____________________________________________  EKG  ED ECG REPORT I, Blake Divine, the attending physician, personally viewed and interpreted this ECG.   Date: 01/01/2019  EKG Time: 2:47  Rate: 90  Rhythm: normal sinus rhythm  Axis: Normal  Intervals:none  ST&T Change: None    PROCEDURES  Procedure(s) performed (including Critical Care):  Procedures   ____________________________________________   INITIAL IMPRESSION / ASSESSMENT AND PLAN / ED COURSE       58 year old male with history of hypertension GERD presents to the ED with constant upper abdominal discomfort radiating up into his chest since yesterday.  EKG is without acute ischemic changes and troponin negative, low  suspicion for ACS given atypical symptoms that have now been constant since yesterday.  Symptoms appear consistent with GERD, will treat with GI cocktail.  Labs thus far have been unremarkable and chest x-ray negative for acute process.  Will screen LFTs and lipase, plan for discharge home with viscous lidocaine if he has improvement with GI cocktail.  Doubt PE or acute aortic pathology.  Remainder of labs, including LFTs and lipase, are unremarkable.  Repeat troponin is within normal limits.  Patient reports improvement in symptoms following GI cocktail, will prescribe viscous lidocaine.  Suspect his symptoms are related to GERD, counseled patient to follow-up with his PCP and return to the ED for any worsening symptoms, patient agrees with plan.      ____________________________________________   FINAL CLINICAL IMPRESSION(S) / ED DIAGNOSES  Final diagnoses:  Chest pain, unspecified type  Gastroesophageal reflux disease, unspecified whether esophagitis present     ED Discharge Orders         Ordered    lidocaine (XYLOCAINE) 2 % solution  As needed     01/01/19 0607           Note:  This document was prepared using Dragon voice recognition software and may include unintentional dictation errors.   Blake Divine, MD 01/01/19 [phone]

## 2019-01-01 NOTE — ED Notes (Addendum)
Pt reports epigastric pain since this past evening; pt says pain is sharp; history of gastritis and takes Omeprazole daily; pain is nonradiating; abd soft; tender to epigastric area; resp even and unlabored; HRR

## 2019-01-01 NOTE — ED Triage Notes (Signed)
Pt states mid chest pain radiating to upper abd with belching and "bad taste" in his mouth since this pm. Pt states has some shob. Pt appears in no acute distress.

## 2019-01-12 ENCOUNTER — Telehealth: Payer: Self-pay | Admitting: Emergency Medicine

## 2019-01-12 ENCOUNTER — Other Ambulatory Visit: Payer: Self-pay

## 2019-01-12 ENCOUNTER — Emergency Department
Admission: EM | Admit: 2019-01-12 | Discharge: 2019-01-12 | Disposition: A | Discharge status: Home / Self Care | Payer: BC Managed Care – PPO | Attending: Emergency Medicine | Admitting: Emergency Medicine

## 2019-01-12 DIAGNOSIS — R109 Unspecified abdominal pain: Secondary | ICD-10-CM | POA: Insufficient documentation

## 2019-01-12 DIAGNOSIS — Z5321 Procedure and treatment not carried out due to patient leaving prior to being seen by health care provider: Secondary | ICD-10-CM | POA: Insufficient documentation

## 2019-01-12 LAB — COMPREHENSIVE METABOLIC PANEL
ALT: 27 U/L (ref 0–44)
AST: 24 U/L (ref 15–41)
Albumin: 3.9 g/dL (ref 3.5–5.0)
Alkaline Phosphatase: 38 U/L (ref 38–126)
Anion gap: 11 (ref 5–15)
BUN: 15 mg/dL (ref 6–20)
CO2: 25 mmol/L (ref 22–32)
Calcium: 9 mg/dL (ref 8.9–10.3)
Chloride: 102 mmol/L (ref 98–111)
Creatinine, Ser: 0.94 mg/dL (ref 0.61–1.24)
GFR calc Af Amer: >60 mL/min (ref >60)
GFR calc non Af Amer: >60 mL/min (ref >60)
Glucose, Bld: 100 mg/dL — ABNORMAL HIGH (ref 70–99)
Potassium: 3.6 mmol/L (ref 3.5–5.1)
Sodium: 138 mmol/L (ref 135–145)
Total Bilirubin: 0.9 mg/dL (ref 0.3–1.2)
Total Protein: 7.1 g/dL (ref 6.5–8.1)

## 2019-01-12 LAB — LIPASE, BLOOD: Lipase: 23 U/L (ref 11–51)

## 2019-01-12 LAB — CBC
HCT: 47 % (ref 39.0–52.0)
Hemoglobin: 16 g/dL (ref 13.0–17.0)
MCH: 29.6 pg (ref 26.0–34.0)
MCHC: 34 g/dL (ref 30.0–36.0)
MCV: 86.9 fL (ref 80.0–100.0)
Platelets: 263 10*3/uL (ref 150–400)
RBC: 5.41 MIL/uL (ref 4.22–5.81)
RDW: 13.8 % (ref 11.5–15.5)
WBC: 7.5 10*3/uL (ref 4.0–10.5)
nRBC: 0 % (ref 0.0–0.2)

## 2019-01-12 LAB — URINALYSIS, COMPLETE (UACMP) WITH MICROSCOPIC
Bacteria, UA: NONE SEEN
Bilirubin Urine: NEGATIVE
Glucose, UA: NEGATIVE mg/dL
Hgb urine dipstick: NEGATIVE
Ketones, ur: NEGATIVE mg/dL
Leukocytes,Ua: NEGATIVE
Nitrite: NEGATIVE
Protein, ur: NEGATIVE mg/dL
Specific Gravity, Urine: 1.02 (ref 1.005–1.030)
pH: 5 (ref 5.0–8.0)

## 2019-01-12 NOTE — Telephone Encounter (Signed)
Called patient due to lwot to inquire about condition and follow up plans. Says he thinks it is his stomach.  He does have pcp at [facility].  I told him to call them and have them look at [facility] and ekg. I explained that all heart attacks do not show on ekg.  I told him most important to tell his doctor his symptoms and return here for chest pain or feeling bad.

## 2019-01-12 NOTE — ED Notes (Signed)
Pt observed eating and drinking snack, no distress noted

## 2019-01-12 NOTE — ED Triage Notes (Signed)
Reports abdominal presssure that began last night after eating, denies NVD. Hx of gastritis. Reports pain radiates to epigastric area. Denies SOB. Pt alert and oriented X4, cooperative, RR even and unlabored, color WNL. Pt in NAD. Last normal BM yesterday.

## 2019-04-15 ENCOUNTER — Other Ambulatory Visit: Payer: Self-pay

## 2019-04-15 DIAGNOSIS — F17210 Nicotine dependence, cigarettes, uncomplicated: Secondary | ICD-10-CM | POA: Diagnosis not present

## 2019-04-15 DIAGNOSIS — Z79899 Other long term (current) drug therapy: Secondary | ICD-10-CM | POA: Insufficient documentation

## 2019-04-15 DIAGNOSIS — J0191 Acute recurrent sinusitis, unspecified: Secondary | ICD-10-CM | POA: Insufficient documentation

## 2019-04-15 DIAGNOSIS — I10 Essential (primary) hypertension: Secondary | ICD-10-CM | POA: Insufficient documentation

## 2019-04-15 DIAGNOSIS — R519 Headache, unspecified: Secondary | ICD-10-CM | POA: Diagnosis present

## 2019-04-15 NOTE — ED Triage Notes (Signed)
9 days of headache and sinus congestion. OTC medication not helping. Drainage is making stomach hurt and makes him feel sick.

## 2019-04-16 ENCOUNTER — Emergency Department: Payer: BC Managed Care – PPO

## 2019-04-16 ENCOUNTER — Encounter: Payer: Self-pay | Admitting: Radiology

## 2019-04-16 ENCOUNTER — Emergency Department
Admission: EM | Admit: 2019-04-16 | Discharge: 2019-04-16 | Disposition: A | Discharge status: Home / Self Care | Payer: BC Managed Care – PPO | Attending: Emergency Medicine | Admitting: Emergency Medicine

## 2019-04-16 DIAGNOSIS — J0191 Acute recurrent sinusitis, unspecified: Secondary | ICD-10-CM

## 2019-04-16 LAB — CBC WITH DIFFERENTIAL/PLATELET
Abs Immature Granulocytes: 0.03 10*3/uL (ref 0.00–0.07)
Basophils Absolute: 0 10*3/uL (ref 0.0–0.1)
Basophils Relative: 0 %
Eosinophils Absolute: 0 10*3/uL (ref 0.0–0.5)
Eosinophils Relative: 1 %
HCT: 48 % (ref 39.0–52.0)
Hemoglobin: 15.9 g/dL (ref 13.0–17.0)
Immature Granulocytes: 0 %
Lymphocytes Relative: 32 %
Lymphs Abs: 2.2 10*3/uL (ref 0.7–4.0)
MCH: 29.4 pg (ref 26.0–34.0)
MCHC: 33.1 g/dL (ref 30.0–36.0)
MCV: 88.9 fL (ref 80.0–100.0)
Monocytes Absolute: 0.5 10*3/uL (ref 0.1–1.0)
Monocytes Relative: 7 %
Neutro Abs: 4 10*3/uL (ref 1.7–7.7)
Neutrophils Relative %: 60 %
Platelets: 343 10*3/uL (ref 150–400)
RBC: 5.4 MIL/uL (ref 4.22–5.81)
RDW: 13.2 % (ref 11.5–15.5)
WBC: 6.8 10*3/uL (ref 4.0–10.5)
nRBC: 0 % (ref 0.0–0.2)

## 2019-04-16 LAB — BASIC METABOLIC PANEL
Anion gap: 9 (ref 5–15)
BUN: 14 mg/dL (ref 6–20)
CO2: 27 mmol/L (ref 22–32)
Calcium: 8.7 mg/dL — ABNORMAL LOW (ref 8.9–10.3)
Chloride: 101 mmol/L (ref 98–111)
Creatinine, Ser: 1.18 mg/dL (ref 0.61–1.24)
GFR calc Af Amer: >60 mL/min (ref >60)
GFR calc non Af Amer: >60 mL/min (ref >60)
Glucose, Bld: 134 mg/dL — ABNORMAL HIGH (ref 70–99)
Potassium: 2.9 mmol/L — ABNORMAL LOW (ref 3.5–5.1)
Sodium: 137 mmol/L (ref 135–145)

## 2019-04-16 MED ORDER — DEXAMETHASONE 10 MG/ML FOR PEDIATRIC ORAL USE: 10.0000 mg | Freq: Once | INTRAMUSCULAR | Status: DC

## 2019-04-16 MED ORDER — DEXAMETHASONE SODIUM PHOSPHATE 10 MG/ML IJ SOLN
10.0000 mg | Freq: Once | INTRAMUSCULAR | Status: AC
  Administered 2019-04-16: 10 mg via INTRAVENOUS
  Filled 2019-04-16: qty 1

## 2019-04-16 MED ORDER — AMOXICILLIN-POT CLAVULANATE 875-125 MG PO TABS: 1.0000 | ORAL_TABLET | Freq: Two times a day (BID) | ORAL | 0 refills | Status: AC

## 2019-04-16 MED ORDER — IOHEXOL 300 MG/ML  SOLN
75.0000 mL | Freq: Once | INTRAMUSCULAR | Status: AC | PRN
  Administered 2019-04-16: 02:00:00 75 mL via INTRAVENOUS

## 2019-04-16 MED ORDER — POTASSIUM CHLORIDE CRYS ER 20 MEQ PO TBCR
40.0000 meq | EXTENDED_RELEASE_TABLET | Freq: Once | ORAL | Status: AC
  Administered 2019-04-16: 40 meq via ORAL
  Filled 2019-04-16: qty 2

## 2019-04-16 NOTE — Discharge Instructions (Signed)
Please take the full course of medication as prescribed and call the ENT clinic to follow up.  Return to the emergency department if you develop new or worsening symptoms that concern you.

## 2019-04-16 NOTE — ED Notes (Signed)
Patient to CT.

## 2019-04-16 NOTE — ED Notes (Signed)
Patient states he has experienced sinus congestion, headache and abdominal pain for the last 2 weeks. Patient reports he believes he is having sinus drainage and causing his stomach to feel sick.

## 2019-04-16 NOTE — ED Provider Notes (Signed)
[facility] Emergency Department Provider Note  ____________________________________________   First MD Initiated Contact with Patient 04/16/19 0014     (approximate)  I have reviewed the triage vital signs and the nursing notes.   HISTORY  Chief Complaint Headache and Facial Pain    HPI Douglas Mcdaniel is a 59 y.o. male with medical history as listed below which includes a past history of sinusitis with several visits over the last few months within the [facility] system for evaluation of sore symptoms.  He presents tonight with what he says is about 9 days to 2 weeks of persistent bilateral facial pain with heavy drainage, nasal congestion, and nausea that results from all the drainage going down the back of his throat.  He has been trying over-the-counter medication that he says is not helping.  After asking about similar symptoms in the past, he did agree that he has been seen at [facility] in the past.  Over the last few months he has completed a course of doxycycline and a course of Augmentin.  After the last time he was seen, he thinks that the Augmentin was what seemed to help him, as well as Afrin and Flonase.  However the symptoms recurred and seem worse than before.  He describes the symptoms as severe and says it makes it difficult for him to breathe and sleep.  He admits to some cocaine use by snorting in the past but says it has been months since he has done so.  He does not have diabetes.  He denies fever/chills, sore throat, chest pain, shortness of breath, cough, abdominal pain (although he says he has gastritis).  He has had some pain radiating up into his right ear which he believes is from the sinuses.  No visual acuity issues, no swelling or pain around his eyes.        Past Medical History:  Diagnosis Date  . Bilateral low back pain without sciatica 03/05/2014  . Bladder wall thickening 02/26/2014   Last Assessment & Plan:  - CT with  bladder wall thickening possibly related to infection v. Neoplasm. - UA and urine cytology pending. - Urology referral for cystoscopy.  . Chronic abdominal pain 08/13/2015   Overview:  Chart Review:.  03/13/15- He underwent an exploratory laparotomy with all examined abdominal contents appearing normal including the entirety of the small bowel, appendix and colon, with no evidence of internal hernia. 03/17/15- CT: Segmental wall thickening in the proximal small bowel with mild dilatation, possibly enteritis, less likely obstruction. Correlate with recent laparoscopy.Smal  . 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 Anxiety. - Will continue to follow.  . Dental infection 02/26/2014  . Dyspepsia 01/15/2014   Last Assessment & Plan:  - Chronic, stable. - Refilled Pepcid.  . Elevated creatine kinase 07/17/2014   Last Assessment & Plan:  - Trending up 216 --> 330 --> 407 --> 295. 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  Other differential includes inflammatory myopathy (dermatomyositis v. Polymyositis) v. v. Medication effect.  Unlikely hypothyroidism as normal TSH. - Denies alcohol and cocaine use. - Continue to monitor.  [name]. Epidermal inclusion cyst 08/13/2015   Last Assessment & Plan:  Discussed excision of the cyst in the future. Patient is in agreement.   . Erectile dysfunction 04/11/2014  . Generalized anxiety disorder 01/15/2014   Last Assessment & Plan:  - Counseled on  importance of re-starting Zoloft 50 mg once daily. - Concern that patient has been presenting to the ED multiple times for array of symptoms likely related to anxiety. - Encouraged to make an appointment at [facility] if patient feels like he needs to go to ED as we have same-day appointments available.  Patient in agreement. - Close follow-up at [facility] in one week.  [name] GERD (gastroesophageal reflux disease)   . Hemorrhoids 04/05/2014  . Hypertension   . Left  inguinal hernia 03/05/2014  . Nonintractable episodic headache 07/17/2014   Last Assessment & Plan:  - Concern that patient has been presenting to the ED with 7 visits since 3/2 for similar symptoms.  Has had multiple rounds of antibiotics.  See chart review in subjective section. - Encouraged to make an appointment at [facility] if patient feels like he needs to go to ED as we have same-day appointments available.  Patient in agreement. - Recommend continuing Flonase and using   . Screening for diabetes mellitus 01/15/2014  . Tobacco use 09/16/2014  . Toe pain, right 04/05/2014  . Tonsillitis 02/12/2014    Patient Active Problem List   Diagnosis Date Noted  . Hypertension 04/26/2016  . Chronic abdominal pain 08/13/2015  . Epidermal inclusion cyst 08/13/2015  . Tobacco use 09/16/2014  . 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 Elevated creatine kinase 07/17/2014  . Nonintractable episodic headache 07/17/2014  . Erectile dysfunction 04/11/2014  . Hemorrhoids 04/05/2014  . Toe pain, right 04/05/2014  . Bilateral low back pain without sciatica 03/05/2014  . Left inguinal hernia 03/05/2014  . Bladder wall thickening 02/26/2014  . Dental infection 02/26/2014  . Tonsillitis 02/12/2014  . Dyspepsia 01/15/2014  . Generalized anxiety disorder 01/15/2014  . GERD (gastroesophageal reflux disease) 01/15/2014  . Screening for diabetes mellitus 01/15/2014    Past Surgical History:  Procedure Laterality Date  . COLONOSCOPY    . COLONOSCOPY W/ POLYPECTOMY  02/07/2015  . head surgery    . head surgery    . LAPAROSCOPY ABDOMEN DIAGNOSTIC  03/13/2015  . UPPER GASTROINTESTINAL ENDOSCOPY  04/29/2014    Prior to Admission medications   Medication Sig Start Date End Date Taking? Authorizing Provider  albuterol (PROVENTIL HFA;VENTOLIN HFA) 108 (90 Base) MCG/ACT inhaler Inhale 2 puffs into the lungs every 6 (six) hours as needed for wheezing or shortness of breath. Patient not taking: Reported on 12/18/2016  08/20/16   Darci Current, MD  albuterol (PROVENTIL HFA;VENTOLIN HFA) 108 (90 Base) MCG/ACT inhaler Inhale 2 puffs into the lungs every 6 (six) hours as needed for wheezing or shortness of breath. 12/12/16   Governor Rooks, MD  amLODipine (NORVASC) 10 MG tablet Take 1 tablet (10 mg total) by mouth daily. 09/08/16   Jeanmarie Plant, MD  amoxicillin-clavulanate (AUGMENTIN) 875-125 MG tablet Take 1 tablet by mouth every 12 (twelve) hours for 10 days. 04/16/19 04/26/19  Loleta Rose, MD  azithromycin (ZITHROMAX) 250 MG tablet Take 1 tablet (250 mg total) by mouth daily. 06/02/17   Irean Hong, MD  famotidine (PEPCID) 20 MG tablet Take 20 mg by mouth 2 (two) times daily. 11/18/16   [provider]  famotidine (PEPCID) 20 MG tablet Take 1 tablet (20 mg total) 2 (two) times daily by mouth. 01/19/17   Sharman Cheek, MD  fluticasone [facility]) 50 MCG/ACT nasal spray Place 2 sprays into both nostrils daily. 02/23/16   Menshew, Charlesetta Ivory, PA-C  hydrochlorothiazide (HYDRODIURIL) 25 MG tablet Take 1 tablet (25 mg total) by mouth daily. 09/08/16  Jeanmarie Plant, MD  HYDROcodone-acetaminophen (NORCO) 5-325 MG tablet Take 1 tablet every 6 (six) hours as needed by mouth for moderate pain. 01/12/17   Irean Hong, MD  ibuprofen (ADVIL,MOTRIN) 600 MG tablet Take 1 tablet (600 mg total) by mouth every 8 (eight) hours as needed. 03/02/18   Merrily Brittle, MD  ipratropium (ATROVENT) 0.06 % nasal spray Place 2 sprays into the nose 3 (three) times daily. 03/02/18 03/02/19  Merrily Brittle, MD  lidocaine (LIDODERM) 5 % Place 1 patch onto the skin every 12 (twelve) hours. Remove & Discard patch within 12 hours or as directed by MD.  Wynelle Fanny the patch off for 12 hours before applying a new one. 09/27/18 09/27/19  Loleta Rose, MD  lidocaine (XYLOCAINE) 2 % solution Use as directed 15 mLs in the mouth or throat as needed for mouth pain. 01/01/19   Chesley Noon, MD  magic mouthwash SOLN Take 5 mLs by mouth 3 (three)  times daily as needed for mouth pain. 06/25/18   Irean Hong, MD  metoCLOPramide (REGLAN) 10 MG tablet Take 1 tablet (10 mg total) every 6 (six) hours as needed by mouth. 01/19/17   Sharman Cheek, MD  omeprazole (PRILOSEC OTC) 20 MG tablet Take 1 tablet (20 mg total) by mouth daily. 09/27/18 09/27/19  Loleta Rose, MD  omeprazole (PRILOSEC) 40 MG capsule Take 1 capsule (40 mg total) by mouth daily. Patient not taking: Reported on 12/18/2016 09/08/16 09/08/17  Jeanmarie Plant, MD  pantoprazole (PROTONIX) 40 MG tablet Take 1 tablet (40 mg total) by mouth daily. 02/12/18 02/12/19  Minna Antis, MD  potassium chloride SA (KLOR-CON M20) 20 MEQ tablet Take 1 tablet (20 mEq total) by mouth daily. 09/24/17   Loleta Rose, MD  predniSONE (DELTASONE) 10 MG tablet Take 5 tablets (50 mg total) by mouth daily. Patient not taking: Reported on 12/18/2016 12/12/16   Governor Rooks, MD  sucralfate (CARAFATE) 1 g tablet Take 1 tablet (1 g total) by mouth 4 (four) times daily. 12/18/16 12/18/17  Governor Rooks, MD  sucralfate (CARAFATE) 1 g tablet Take 1 tablet (1 g total) 4 (four) times daily by mouth. 01/19/17   Sharman Cheek, MD    Allergies Other  Family History  Problem Relation Age of Onset  . Diabetes Mother   . Multiple sclerosis Brother   . Breast cancer Maternal Aunt   . Lung cancer Maternal Grandfather   . Stomach cancer Paternal Grandmother   . Colon cancer Maternal Uncle     Social History Social History   Tobacco Use  . Smoking status: Current Every Day Smoker    Packs/day: 0.25    Years: 10.00    Pack years: 2.50    Types: Cigarettes  . Smokeless tobacco: Never Used  Substance Use Topics  . Alcohol use: Yes    Comment: daily  . Drug use: No    Review of Systems Constitutional: No fever/chills Eyes: No visual changes. ENT: Recurrent facial pain/sinusitis with pain radiating up into his right ear.  Severe nasal congestion and drainage.  No sore throat. Cardiovascular:  Denies chest pain. Respiratory: Denies shortness of breath. Gastrointestinal: No abdominal pain.  Nausea which she believes is from the drainage from his sinuses, no vomiting.  No diarrhea.  No constipation. Genitourinary: Negative for dysuria. Musculoskeletal: Negative for neck pain.  Negative for back pain. Integumentary: Negative for rash. Neurological: Negative for headaches, focal weakness or numbness.   ____________________________________________   PHYSICAL EXAM:  VITAL SIGNS: ED Triage  Vitals  Enc Vitals Group     BP 04/15/19 2319 132/81     Pulse Rate 04/15/19 2319 90     Resp 04/15/19 2319 17     Temp 04/15/19 2319 98.1 F (36.7 C)     Temp Source 04/15/19 2319 Oral     SpO2 04/15/19 2319 95 %     Weight 04/15/19 2315 95.3 kg (210 lb)     Height 04/15/19 2315 1.753 m (5\' 9" )     Head Circumference --      Peak Flow --      Pain Score 04/15/19 2315 9     Pain Loc --      Pain Edu? --      Excl. in [place]? --     Constitutional: Alert and oriented.  Eyes: Conjunctivae are normal. No periorbital edema, no proptosis, pupils are equal and reactive bilaterally.  Extraocular motion is intact. Head: Atraumatic.  Tenderness to palpation of the maxillary sinuses. Ears:  Healthy appearing ear canals and TMs bilaterally Nose: Significant congestion/rhinnorhea.  Boggy and erythematous turbinates bilaterally, no septal hematoma.  No epistaxis. Mouth/Throat: Patient is wearing a mask. Neck: No stridor.  No meningeal signs.   Cardiovascular: Normal rate, regular rhythm. Good peripheral circulation. Grossly normal heart sounds. Respiratory: Normal respiratory effort.  No retractions. Gastrointestinal: Soft and nontender. No distention.  Musculoskeletal: No lower extremity tenderness nor edema. No gross deformities of extremities. Neurologic:  Normal speech and language. No gross focal neurologic deficits are appreciated.  Skin:  Skin is warm, dry and intact. Psychiatric: Mood and  affect are normal. Speech and behavior are normal.  ____________________________________________   LABS (all labs ordered are listed, but only abnormal results are displayed)  Labs Reviewed  BASIC METABOLIC PANEL - Abnormal; Notable for the following components:      Result Value   Potassium 2.9 (*)    Glucose, Bld 134 (*)    Calcium 8.7 (*)    All other components within normal limits  CBC WITH DIFFERENTIAL/PLATELET   ____________________________________________  EKG  None - EKG not ordered by ED physician ____________________________________________  RADIOLOGY Ursula Alert, personally viewed and evaluated these images (plain radiographs) as part of my medical decision making, as well as reviewing the written report by the radiologist.  ED MD interpretation:  Sinusitis without dangerous/emergent finding  Official radiology report(s): CT Maxillofacial W Contrast  Result Date: 04/16/2019 CLINICAL DATA:  Sinusitis.  Headache.  Congestion. EXAM: CT MAXILLOFACIAL WITH CONTRAST TECHNIQUE: Multidetector CT imaging of the maxillofacial structures was performed with intravenous contrast. Multiplanar CT image reconstructions were also generated. CONTRAST:  75mL OMNIPAQUE IOHEXOL 300 MG/ML  SOLN COMPARISON:  Head CT 02/25/2016 FINDINGS: Osseous: No bone abnormality. Orbits: Orbits are normal. Sinuses: Frontal sinuses are clear. Scattered opacified ethmoid air cells. Mucosal thickening of the right division of the sphenoid sinus. Circumferential mucosal thickening of the left maxillary sinus without fluid level. Minimal thickening at the right maxillary sinus floor. Ostiomeatal units obscured by mucosal thickening. Soft tissues: Otherwise negative Limited intracranial: Normal IMPRESSION: Mucosal inflammatory disease most pronounced affecting the left maxillary sinus and also the right division of the sphenoid sinus. No fluid levels or complete opacification. Electronically Signed   By: Nelson Chimes M.D.   On: 04/16/2019 01:52    ____________________________________________   PROCEDURES   Procedure(s) performed (including Critical Care):  Procedures   ____________________________________________   INITIAL IMPRESSION / MDM / [place] / ED COURSE  As part of my  medical decision making, I reviewed the following data within the electronic MEDICAL RECORD NUMBER Nursing notes reviewed and incorporated, Labs reviewed , Old chart reviewed and Notes from prior ED visits   Differential diagnosis includes, but is not limited to, chronic recurrent sinusitis, acute bacterial sinusitis, cavernous venous thrombosis, COVID-19, space-occupying lesion such as malignancy, erosion from prior cocaine use, less likely fungal infection.  The patient's vital signs are stable and he is afebrile.  I doubt a serious bacterial infection, but he is having severe and recurrent symptoms multiple times over the last couple months with antibiotic usage.  He is not diabetic.  He has a history of at least occasional cocaine use.  It was recommended to him based on reviewing the [facility] notes that he follow-up with his primary care provider and be referred to ENT.  He states that he has an appointment with ENT but not for another month and he does not member the name of the provider or clinic at this time.  Given the recurrent nature and severe symptoms that he is experiencing at this time, I think it is reasonable to obtain imaging to rule out a more emergent or treatable cause rather than simply assuming chronic viral sinusitis.  I will check basic lab work and verify kidney function and then check a CT maxillofacial with IV contrast.  I have explained to the patient my reasoning and he is in agreement with the plan.  I have also explained that if we do not find an alternative diagnosis I will discharge him on a course of Augmentin which seemed to work for him last time and recommend continuing to use Flonase,  Afrin for no more than 3 days, and to consider a Neti pot.  He understands and agrees with the plan.      Clinical Course as of Apr 15 640  Mon Apr 16, 2019  0108 WBC: 6.8 [CF]  0135 Hypokalemia, otherwise normal BMP.  Will proceed with CT  Potassium(!): 2.9 [CF]  0159 Sinusitis and multiple sinuses but no emergent medical condition.  I will update the patient and provide prescriptions as listed below and encourage close follow-up with ENT.  I will also give him my usual and customary return precautions.  CT Maxillofacial W Contrast [CF]    Clinical Course User Index [CF] Loleta Rose, MD     ____________________________________________  FINAL CLINICAL IMPRESSION(S) / ED DIAGNOSES  Final diagnoses:  Acute recurrent sinusitis, unspecified location     MEDICATIONS GIVEN DURING THIS VISIT:  Medications  iohexol (OMNIPAQUE) 300 MG/ML solution 75 mL (75 mLs Intravenous Contrast Given 04/16/19 0131)  dexamethasone (DECADRON) injection 10 mg (10 mg Intravenous Given 04/16/19 0243)  potassium chloride SA (KLOR-CON) CR tablet 40 mEq (40 mEq Oral Given 04/16/19 0253)     ED Discharge Orders         Ordered    amoxicillin-clavulanate (AUGMENTIN) 875-125 MG tablet  Every 12 hours     04/16/19 0216          *Please note:  Kori Colin Leazer was evaluated in Emergency Department on 04/16/2019 for the symptoms described in the history of present illness. He was evaluated in the context of the global COVID-19 pandemic, which necessitated consideration that the patient might be at risk for infection with the SARS-CoV-2 virus that causes COVID-19. Institutional protocols and algorithms that pertain to the evaluation of patients at risk for COVID-19 are in a state of rapid change based on information released by regulatory  bodies including the CDC and federal and state organizations. These policies and algorithms were followed during the patient's care in the ED.  Some ED evaluations and  interventions may be delayed as a result of limited staffing during the pandemic.*  Note:  This document was prepared using Dragon voice recognition software and may include unintentional dictation errors.   Loleta Rose, MD 04/16/19 [phone]

## 2019-05-08 ENCOUNTER — Other Ambulatory Visit: Payer: Self-pay

## 2019-05-08 ENCOUNTER — Emergency Department
Admission: EM | Admit: 2019-05-08 | Discharge: 2019-05-08 | Disposition: A | Discharge status: Home / Self Care | Payer: BC Managed Care – PPO | Attending: Student | Admitting: Student

## 2019-05-08 ENCOUNTER — Encounter: Payer: Self-pay | Admitting: Emergency Medicine

## 2019-05-08 DIAGNOSIS — F17210 Nicotine dependence, cigarettes, uncomplicated: Secondary | ICD-10-CM | POA: Insufficient documentation

## 2019-05-08 DIAGNOSIS — K2920 Alcoholic gastritis without bleeding: Secondary | ICD-10-CM | POA: Diagnosis not present

## 2019-05-08 DIAGNOSIS — R1012 Left upper quadrant pain: Secondary | ICD-10-CM

## 2019-05-08 DIAGNOSIS — Z20822 Contact with and (suspected) exposure to covid-19: Secondary | ICD-10-CM | POA: Insufficient documentation

## 2019-05-08 DIAGNOSIS — R1032 Left lower quadrant pain: Secondary | ICD-10-CM | POA: Diagnosis not present

## 2019-05-08 DIAGNOSIS — I10 Essential (primary) hypertension: Secondary | ICD-10-CM | POA: Diagnosis not present

## 2019-05-08 DIAGNOSIS — Z79899 Other long term (current) drug therapy: Secondary | ICD-10-CM | POA: Insufficient documentation

## 2019-05-08 DIAGNOSIS — R1013 Epigastric pain: Secondary | ICD-10-CM

## 2019-05-08 LAB — CBC
HCT: 48 % (ref 39.0–52.0)
Hemoglobin: 15.8 g/dL (ref 13.0–17.0)
MCH: 29.5 pg (ref 26.0–34.0)
MCHC: 32.9 g/dL (ref 30.0–36.0)
MCV: 89.6 fL (ref 80.0–100.0)
Platelets: 261 10*3/uL (ref 150–400)
RBC: 5.36 MIL/uL (ref 4.22–5.81)
RDW: 13.8 % (ref 11.5–15.5)
WBC: 5.7 10*3/uL (ref 4.0–10.5)
nRBC: 0 % (ref 0.0–0.2)

## 2019-05-08 LAB — COMPREHENSIVE METABOLIC PANEL
ALT: 24 U/L (ref 0–44)
AST: 22 U/L (ref 15–41)
Albumin: 3.9 g/dL (ref 3.5–5.0)
Alkaline Phosphatase: 44 U/L (ref 38–126)
Anion gap: 11 (ref 5–15)
BUN: 12 mg/dL (ref 6–20)
CO2: 26 mmol/L (ref 22–32)
Calcium: 8.5 mg/dL — ABNORMAL LOW (ref 8.9–10.3)
Chloride: 105 mmol/L (ref 98–111)
Creatinine, Ser: 0.92 mg/dL (ref 0.61–1.24)
GFR calc Af Amer: >60 mL/min (ref >60)
GFR calc non Af Amer: >60 mL/min (ref >60)
Glucose, Bld: 107 mg/dL — ABNORMAL HIGH (ref 70–99)
Potassium: 3.3 mmol/L — ABNORMAL LOW (ref 3.5–5.1)
Sodium: 142 mmol/L (ref 135–145)
Total Bilirubin: 0.5 mg/dL (ref 0.3–1.2)
Total Protein: 6.9 g/dL (ref 6.5–8.1)

## 2019-05-08 LAB — LIPASE, BLOOD: Lipase: 24 U/L (ref 11–51)

## 2019-05-08 LAB — TROPONIN I (HIGH SENSITIVITY): Troponin I (High Sensitivity): 4 ng/L (ref <18)

## 2019-05-08 LAB — SARS CORONAVIRUS 2 (TAT 6-24 HRS): SARS Coronavirus 2: NEGATIVE

## 2019-05-08 MED ORDER — PANTOPRAZOLE SODIUM 40 MG PO TBEC
40.0000 mg | DELAYED_RELEASE_TABLET | Freq: Every day | ORAL | Status: DC
  Administered 2019-05-08: 40 mg via ORAL
  Filled 2019-05-08: qty 1

## 2019-05-08 MED ORDER — SODIUM CHLORIDE 0.9% FLUSH: 3.0000 mL | Freq: Once | INTRAVENOUS | Status: DC

## 2019-05-08 MED ORDER — PANTOPRAZOLE SODIUM 40 MG PO TBEC: 40.0000 mg | DELAYED_RELEASE_TABLET | Freq: Every day | ORAL | 0 refills | Status: DC

## 2019-05-08 MED ORDER — HYDROCHLOROTHIAZIDE 25 MG PO TABS: 25.0000 mg | ORAL_TABLET | Freq: Every day | ORAL | 0 refills | Status: AC

## 2019-05-08 MED ORDER — POTASSIUM CHLORIDE CRYS ER 20 MEQ PO TBCR: 20.0000 meq | EXTENDED_RELEASE_TABLET | Freq: Every day | ORAL | 0 refills | Status: AC

## 2019-05-08 MED ORDER — ALUM & MAG HYDROXIDE-SIMETH 200-200-20 MG/5ML PO SUSP
30.0000 mL | Freq: Once | ORAL | Status: AC
  Administered 2019-05-08: 30 mL via ORAL
  Filled 2019-05-08: qty 30

## 2019-05-08 MED ORDER — PANTOPRAZOLE SODIUM 40 MG PO TBEC: 40.0000 mg | DELAYED_RELEASE_TABLET | Freq: Every day | ORAL | 0 refills | Status: AC

## 2019-05-08 MED ORDER — HYDROCHLOROTHIAZIDE 25 MG PO TABS: 25.0000 mg | ORAL_TABLET | Freq: Every day | ORAL | 0 refills | Status: DC

## 2019-05-08 MED ORDER — AMLODIPINE BESYLATE 10 MG PO TABS: 10.0000 mg | ORAL_TABLET | Freq: Every day | ORAL | 0 refills | Status: AC

## 2019-05-08 MED ORDER — POTASSIUM CHLORIDE CRYS ER 20 MEQ PO TBCR: 20.0000 meq | EXTENDED_RELEASE_TABLET | Freq: Every day | ORAL | 0 refills | Status: DC

## 2019-05-08 MED ORDER — AMLODIPINE BESYLATE 10 MG PO TABS: 10.0000 mg | ORAL_TABLET | Freq: Every day | ORAL | 0 refills | Status: DC

## 2019-05-08 NOTE — Discharge Instructions (Addendum)
Thank you for letting us take care of you in the emergency department today.  Avoiding alcohol, caffeine, spicy foods, fatty foods will help improve your gastritis.  We have written you a 30-day prescription refill for your general medications, including amlodipine, hydrochlorothiazide, potassium.  We have also written you a new prescription for acid reflux.  Please take these as directed.  Your COVID test is pending at this time, and should result in the next 24 hours.  You will receive a phone call if they are positive.  To help with your sinus congestion, you can obtain an over-the-counter decongestion and Flonase nasal spray and take as directed.  Please follow up with: - Your primary care doctor to review your ER visit and follow up on your symptoms and for review and refill of your medications.   Please return to the ER for any new or worsening symptoms.

## 2019-05-08 NOTE — ED Notes (Signed)
Sent rainbow to lab. 

## 2019-05-08 NOTE — ED Triage Notes (Signed)
C/O abdominal pain and emesis today.  States has history of gastritis that is aggravated by drinking alcohol.  Patient states he drank a fifth of Vodka last night.  Also, patient has been out of BP meds x 2 days.

## 2019-05-08 NOTE — ED Provider Notes (Signed)
[facility] Emergency Department Provider Note  ____________________________________________   First MD Initiated Contact with Patient 05/08/19 0932     (approximate)  I have reviewed the triage vital signs and the nursing notes.  History  Chief Complaint Abdominal Pain and Emesis    HPI Douglas Mcdaniel is a 59 y.o. male with hx of GERD, alcoholic gastritis who presents for exacerbation of his gastritis. Patient states he drank a 5th of liquor yesterday which has caused exacerbation of his known gastritis. Reports burning, sharp epigastric pain, constant since this AM after drinking. Moderate in severity. No radiation. No alleviating or aggravating components. No nausea, vomiting, diarrhea. No chest pain or SOB. Also requesting refill of his BP medications. Also requesting COVID test as he recently drove in a car w/ someone with rhinorrhea and now feels like his sinuses are acting up.    Past Medical Hx Past Medical History:  Diagnosis Date  . Bilateral low back pain without sciatica 03/05/2014  . Bladder wall thickening 02/26/2014   Last Assessment & Plan:  - CT with bladder wall thickening possibly related to infection v. Neoplasm. - UA and urine cytology pending. - Urology referral for cystoscopy.  . Chronic abdominal pain 08/13/2015   Overview:  Chart Review:.  03/13/15- He underwent an exploratory laparotomy with all examined abdominal contents appearing normal including the entirety of the small bowel, appendix and colon, with no evidence of internal hernia. 03/17/15- CT: Segmental wall thickening in the proximal small bowel with mild dilatation, possibly enteritis, less likely obstruction. Correlate with recent laparoscopy.Smal  . 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 Anxiety. - Will continue to follow.  . Dental infection 02/26/2014  . Dyspepsia 01/15/2014   Last Assessment & Plan:  - Chronic, stable. -  Refilled Pepcid.  . Elevated creatine kinase 07/17/2014   Last Assessment & Plan:  - Trending up 216 --> 330 --> 407 --> 295. 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  Other differential includes inflammatory myopathy (dermatomyositis v. Polymyositis) v. v. Medication effect.  Unlikely hypothyroidism as normal TSH. - Denies alcohol and cocaine use. - Continue to monitor.  [name] Epidermal inclusion cyst 08/13/2015   Last Assessment & Plan:  Discussed excision of the cyst in the future. Patient is in agreement.   . Erectile dysfunction 04/11/2014  . Generalized anxiety disorder 01/15/2014   Last Assessment & Plan:  - Counseled on importance of re-starting Zoloft 50 mg once daily. - Concern that patient has been presenting to the ED multiple times for array of symptoms likely related to anxiety. - Encouraged to make an appointment at [facility] if patient feels like he needs to go to ED as we have same-day appointments available.  Patient in agreement. - Close follow-up at [facility] in one week.  [name] GERD (gastroesophageal reflux disease)   . Hemorrhoids 04/05/2014  . Hypertension   . Left inguinal hernia 03/05/2014  . Nonintractable episodic headache 07/17/2014   Last Assessment & Plan:  - Concern that patient has been presenting to the ED with 7 visits since 3/2 for similar symptoms.  Has had multiple rounds of antibiotics.  See chart review in subjective section. - Encouraged to make an appointment at [facility] if patient feels like he needs to go to ED as we have same-day appointments available.  Patient in agreement. - Recommend continuing Flonase and using   . Screening for diabetes  mellitus 01/15/2014  . Tobacco use 09/16/2014  . Toe pain, right 04/05/2014  . Tonsillitis 02/12/2014    Problem List Patient Active Problem List   Diagnosis Date Noted  . Hypertension 04/26/2016  . Chronic abdominal pain 08/13/2015  . Epidermal inclusion cyst 08/13/2015  . Tobacco use 09/16/2014  .  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 Elevated creatine kinase 07/17/2014  . Nonintractable episodic headache 07/17/2014  . Erectile dysfunction 04/11/2014  . Hemorrhoids 04/05/2014  . Toe pain, right 04/05/2014  . Bilateral low back pain without sciatica 03/05/2014  . Left inguinal hernia 03/05/2014  . Bladder wall thickening 02/26/2014  . Dental infection 02/26/2014  . Tonsillitis 02/12/2014  . Dyspepsia 01/15/2014  . Generalized anxiety disorder 01/15/2014  . GERD (gastroesophageal reflux disease) 01/15/2014  . Screening for diabetes mellitus 01/15/2014    Past Surgical Hx Past Surgical History:  Procedure Laterality Date  . COLONOSCOPY    . COLONOSCOPY W/ POLYPECTOMY  02/07/2015  . head surgery    . head surgery    . LAPAROSCOPY ABDOMEN DIAGNOSTIC  03/13/2015  . UPPER GASTROINTESTINAL ENDOSCOPY  04/29/2014    Medications Prior to Admission medications   Medication Sig Start Date End Date Taking? Authorizing Provider  albuterol (PROVENTIL HFA;VENTOLIN HFA) 108 (90 Base) MCG/ACT inhaler Inhale 2 puffs into the lungs every 6 (six) hours as needed for wheezing or shortness of breath. Patient not taking: Reported on 12/18/2016 08/20/16   Gregor Hams, MD  albuterol (PROVENTIL HFA;VENTOLIN HFA) 108 (90 Base) MCG/ACT inhaler Inhale 2 puffs into the lungs every 6 (six) hours as needed for wheezing or shortness of breath. 12/12/16   Lisa Roca, MD  amLODipine (NORVASC) 10 MG tablet Take 1 tablet (10 mg total) by mouth daily. 09/08/16   Schuyler Amor, MD  azithromycin (ZITHROMAX) 250 MG tablet Take 1 tablet (250 mg total) by mouth daily. 06/02/17   Paulette Blanch, MD  famotidine (PEPCID) 20 MG tablet Take 20 mg by mouth 2 (two) times daily. 11/18/16   [provider]  famotidine (PEPCID) 20 MG tablet Take 1 tablet (20 mg total) 2 (two) times daily by mouth. 01/19/17   Carrie Mew, MD  fluticasone [facility]) 50 MCG/ACT nasal spray Place 2 sprays into both nostrils daily.  02/23/16   Menshew, Dannielle Karvonen, PA-C  hydrochlorothiazide (HYDRODIURIL) 25 MG tablet Take 1 tablet (25 mg total) by mouth daily. 09/08/16   Schuyler Amor, MD  HYDROcodone-acetaminophen (NORCO) 5-325 MG tablet Take 1 tablet every 6 (six) hours as needed by mouth for moderate pain. 01/12/17   Paulette Blanch, MD  ibuprofen (ADVIL,MOTRIN) 600 MG tablet Take 1 tablet (600 mg total) by mouth every 8 (eight) hours as needed. 03/02/18   Darel Hong, MD  ipratropium (ATROVENT) 0.06 % nasal spray Place 2 sprays into the nose 3 (three) times daily. 03/02/18 03/02/19  Darel Hong, MD  lidocaine (LIDODERM) 5 % Place 1 patch onto the skin every 12 (twelve) hours. Remove & Discard patch within 12 hours or as directed by MD.  Pershing Proud the patch off for 12 hours before applying a new one. 09/27/18 09/27/19  Hinda Kehr, MD  lidocaine (XYLOCAINE) 2 % solution Use as directed 15 mLs in the mouth or throat as needed for mouth pain. 01/01/19   Blake Divine, MD  magic mouthwash SOLN Take 5 mLs by mouth 3 (three) times daily as needed for mouth pain. 06/25/18   Paulette Blanch, MD  metoCLOPramide (REGLAN) 10 MG tablet Take 1  tablet (10 mg total) every 6 (six) hours as needed by mouth. 01/19/17   Sharman Cheek, MD  omeprazole (PRILOSEC OTC) 20 MG tablet Take 1 tablet (20 mg total) by mouth daily. 09/27/18 09/27/19  Loleta Rose, MD  omeprazole (PRILOSEC) 40 MG capsule Take 1 capsule (40 mg total) by mouth daily. Patient not taking: Reported on 12/18/2016 09/08/16 09/08/17  Jeanmarie Plant, MD  pantoprazole (PROTONIX) 40 MG tablet Take 1 tablet (40 mg total) by mouth daily. 02/12/18 02/12/19  Minna Antis, MD  potassium chloride SA (KLOR-CON M20) 20 MEQ tablet Take 1 tablet (20 mEq total) by mouth daily. 09/24/17   Loleta Rose, MD  predniSONE (DELTASONE) 10 MG tablet Take 5 tablets (50 mg total) by mouth daily. Patient not taking: Reported on 12/18/2016 12/12/16   Governor Rooks, MD  sucralfate (CARAFATE) 1 g tablet  Take 1 tablet (1 g total) by mouth 4 (four) times daily. 12/18/16 12/18/17  Governor Rooks, MD  sucralfate (CARAFATE) 1 g tablet Take 1 tablet (1 g total) 4 (four) times daily by mouth. 01/19/17   Sharman Cheek, MD    Allergies Other  Family Hx Family History  Problem Relation Age of Onset  . Diabetes Mother   . Multiple sclerosis Brother   . Breast cancer Maternal Aunt   . Lung cancer Maternal Grandfather   . Stomach cancer Paternal Grandmother   . Colon cancer Maternal Uncle     Social Hx Social History   Tobacco Use  . Smoking status: Current Every Day Smoker    Packs/day: 0.25    Years: 10.00    Pack years: 2.50    Types: Cigarettes  . Smokeless tobacco: Never Used  Substance Use Topics  . Alcohol use: Yes    Comment: daily  . Drug use: No     Review of Systems  Constitutional: Negative for fever, chills. Eyes: Negative for visual changes. ENT: Negative for sore throat. Cardiovascular: Negative for chest pain. Respiratory: Negative for shortness of breath. Gastrointestinal: Negative for nausea, vomiting. + epigastric pain Genitourinary: Negative for dysuria. Musculoskeletal: Negative for leg swelling. Skin: Negative for rash. Neurological: Negative for headaches.   Physical Exam  Vital Signs: ED Triage Vitals  Enc Vitals Group     BP 05/08/19 0910 132/80     Pulse Rate 05/08/19 0910 94     Resp 05/08/19 0910 16     Temp 05/08/19 0910 97.8 F (36.6 C)     Temp Source 05/08/19 0910 Oral     SpO2 05/08/19 0910 97 %     Weight 05/08/19 0911 210 lb (95.3 kg)     Height 05/08/19 0911 5\' 9"  (1.753 m)     Head Circumference --      Peak Flow --      Pain Score 05/08/19 0910 10     Pain Loc --      Pain Edu? --      Excl. in GC? --     Constitutional: Alert and oriented.  Head: Normocephalic. Atraumatic. Eyes: Conjunctivae clear. Sclera anicteric. Nose: No congestion. No rhinorrhea. Mouth/Throat: Wearing mask.  Neck: No stridor.     Cardiovascular: Normal rate, regular rhythm. Extremities well perfused. Respiratory: Normal respiratory effort.  Lungs CTAB. Gastrointestinal: Soft. TTP epigastrium. No rebound/guarding. Remainder abdomen soft and NT. Musculoskeletal: No lower extremity edema. No deformities. Neurologic:  Normal speech and language. No gross focal neurologic deficits are appreciated.  Skin: Skin is warm, dry and intact. No rash noted. Psychiatric: Mood and  affect are appropriate for situation.  EKG  Personally reviewed.   Rate: 80 Rhythm: sinus Axis: normal Intervals: WNL No acute ischemic changes Nonspecific T wave flattening No STEMI    Procedures  Procedure(s) performed (including critical care):  Procedures   Initial Impression / Assessment and Plan / ED Course  59 y.o. male who presents to the ED for epigastric pain, likely exacerbation of his alcoholic gastritis  Ddx: alcoholic gastritis, atypical ACS, pancreatitis  Will evaluate with labs, EKG. Medications and reassess.   Labs, including troponin w/o actionable derangements. Pain improved with medication. As such, stable for discharge. Provided BP medication refills as well as Rx for PPI. Advised PCP follow up. He is aware that COVID test is pending at discharge, will receive call if positive. Advised OTC support for sinus congestion. PCP follow up and given return precautions. Patient agreeable.    Final Clinical Impression(s) / ED Diagnosis  Final diagnoses:  Acute alcoholic gastritis without hemorrhage  Epigastric pain  LUQ abdominal pain       Note:  This document was prepared using Dragon voice recognition software and may include unintentional dictation errors.   Miguel Aschoff., MD 05/08/19 1901

## 2019-05-10 ENCOUNTER — Emergency Department
Admission: EM | Admit: 2019-05-10 | Discharge: 2019-05-10 | Disposition: A | Discharge status: Home / Self Care | Payer: BC Managed Care – PPO | Attending: Emergency Medicine | Admitting: Emergency Medicine

## 2019-05-10 ENCOUNTER — Other Ambulatory Visit: Payer: Self-pay

## 2019-05-10 DIAGNOSIS — F17210 Nicotine dependence, cigarettes, uncomplicated: Secondary | ICD-10-CM | POA: Diagnosis not present

## 2019-05-10 DIAGNOSIS — R1013 Epigastric pain: Secondary | ICD-10-CM | POA: Diagnosis present

## 2019-05-10 DIAGNOSIS — K2920 Alcoholic gastritis without bleeding: Secondary | ICD-10-CM | POA: Diagnosis not present

## 2019-05-10 DIAGNOSIS — I10 Essential (primary) hypertension: Secondary | ICD-10-CM | POA: Diagnosis not present

## 2019-05-10 DIAGNOSIS — Z79899 Other long term (current) drug therapy: Secondary | ICD-10-CM | POA: Insufficient documentation

## 2019-05-10 LAB — COMPREHENSIVE METABOLIC PANEL
ALT: 24 U/L (ref 0–44)
AST: 24 U/L (ref 15–41)
Albumin: 4.2 g/dL (ref 3.5–5.0)
Alkaline Phosphatase: 39 U/L (ref 38–126)
Anion gap: 10 (ref 5–15)
BUN: 13 mg/dL (ref 6–20)
CO2: 26 mmol/L (ref 22–32)
Calcium: 9 mg/dL (ref 8.9–10.3)
Chloride: 102 mmol/L (ref 98–111)
Creatinine, Ser: 1.02 mg/dL (ref 0.61–1.24)
GFR calc Af Amer: >60 mL/min (ref >60)
GFR calc non Af Amer: >60 mL/min (ref >60)
Glucose, Bld: 121 mg/dL — ABNORMAL HIGH (ref 70–99)
Potassium: 3.3 mmol/L — ABNORMAL LOW (ref 3.5–5.1)
Sodium: 138 mmol/L (ref 135–145)
Total Bilirubin: 0.8 mg/dL (ref 0.3–1.2)
Total Protein: 6.9 g/dL (ref 6.5–8.1)

## 2019-05-10 LAB — CBC
HCT: 47.7 % (ref 39.0–52.0)
Hemoglobin: 15.8 g/dL (ref 13.0–17.0)
MCH: 29.5 pg (ref 26.0–34.0)
MCHC: 33.1 g/dL (ref 30.0–36.0)
MCV: 89.2 fL (ref 80.0–100.0)
Platelets: 279 10*3/uL (ref 150–400)
RBC: 5.35 MIL/uL (ref 4.22–5.81)
RDW: 13.6 % (ref 11.5–15.5)
WBC: 9.1 10*3/uL (ref 4.0–10.5)
nRBC: 0 % (ref 0.0–0.2)

## 2019-05-10 LAB — ETHANOL: Alcohol, Ethyl (B): <10 mg/dL (ref <10)

## 2019-05-10 LAB — TROPONIN I (HIGH SENSITIVITY): Troponin I (High Sensitivity): 4 ng/L (ref <18)

## 2019-05-10 LAB — LIPASE, BLOOD: Lipase: 22 U/L (ref 11–51)

## 2019-05-10 MED ORDER — LIDOCAINE VISCOUS HCL 2 % MT SOLN
15.0000 mL | Freq: Once | OROMUCOSAL | Status: AC
  Administered 2019-05-10: 15 mL via ORAL
  Filled 2019-05-10: qty 15

## 2019-05-10 MED ORDER — ALUM & MAG HYDROXIDE-SIMETH 200-200-20 MG/5ML PO SUSP
30.0000 mL | Freq: Once | ORAL | Status: AC
  Administered 2019-05-10: 30 mL via ORAL
  Filled 2019-05-10: qty 30

## 2019-05-10 NOTE — ED Triage Notes (Signed)
Pt in with co epigastric pain that started at 0000, here for the same 2 days and dx with gastritis. Pt denies any vomiting, no diarrhea, has had etoh today.

## 2019-05-10 NOTE — ED Provider Notes (Signed)
[facility] Emergency Department Provider Note  ____________________________________________   First MD Initiated Contact with Patient 05/10/19 [phone]     (approximate)  I have reviewed the triage vital signs and the nursing notes.   HISTORY  Chief Complaint Abdominal Pain   HPI Douglas Mcdaniel is a 59 y.o. male with below list of previous medical conditions including chronic abdominal pain recently seen in the emergency department 2 days ago secondary to gastritis returns to the emergency department secondary to burning epigastric discomfort following consuming alcohol last night.  Patient states that upon his arrival to the room he "passed gas and started feeling better.  Patient without pain at present.  Patient denies any nausea vomiting diarrhea constipation.  Patient denies any urinary symptoms.     Past Medical History:  Diagnosis Date  . Bilateral low back pain without sciatica 03/05/2014  . Bladder wall thickening 02/26/2014   Last Assessment & Plan:  - CT with bladder wall thickening possibly related to infection v. Neoplasm. - UA and urine cytology pending. - Urology referral for cystoscopy.  . Chronic abdominal pain 08/13/2015   Overview:  Chart Review:.  03/13/15- He underwent an exploratory laparotomy with all examined abdominal contents appearing normal including the entirety of the small bowel, appendix and colon, with no evidence of internal hernia. 03/17/15- CT: Segmental wall thickening in the proximal small bowel with mild dilatation, possibly enteritis, less likely obstruction. Correlate with recent laparoscopy.Smal  . 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 Anxiety. - Will continue to follow.  . Dental infection 02/26/2014  . Dyspepsia 01/15/2014   Last Assessment & Plan:  - Chronic, stable. - Refilled Pepcid.  . Elevated creatine kinase 07/17/2014   Last Assessment & Plan:  - Trending up 216  --> 330 --> 407 --> 295. 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  Other differential includes inflammatory myopathy (dermatomyositis v. Polymyositis) v. v. Medication effect.  Unlikely hypothyroidism as normal TSH. - Denies alcohol and cocaine use. - Continue to monitor.  [name] Epidermal inclusion cyst 08/13/2015   Last Assessment & Plan:  Discussed excision of the cyst in the future. Patient is in agreement.   . Erectile dysfunction 04/11/2014  . Generalized anxiety disorder 01/15/2014   Last Assessment & Plan:  - Counseled on importance of re-starting Zoloft 50 mg once daily. - Concern that patient has been presenting to the ED multiple times for array of symptoms likely related to anxiety. - Encouraged to make an appointment at [facility] if patient feels like he needs to go to ED as we have same-day appointments available.  Patient in agreement. - Close follow-up at [facility] in one week.  [name] GERD (gastroesophageal reflux disease)   . Hemorrhoids 04/05/2014  . Hypertension   . Left inguinal hernia 03/05/2014  . Nonintractable episodic headache 07/17/2014   Last Assessment & Plan:  - Concern that patient has been presenting to the ED with 7 visits since 3/2 for similar symptoms.  Has had multiple rounds of antibiotics.  See chart review in subjective section. - Encouraged to make an appointment at [facility] if patient feels like he needs to go to ED as we have same-day appointments available.  Patient in agreement. - Recommend continuing Flonase and using   . Screening for diabetes mellitus 01/15/2014  . Tobacco use 09/16/2014  . Toe pain, right 04/05/2014  . Tonsillitis 02/12/2014  Patient Active Problem List   Diagnosis Date Noted  . Hypertension 04/26/2016  . Chronic abdominal pain 08/13/2015  . Epidermal inclusion cyst 08/13/2015  . Tobacco use 09/16/2014  . 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 Elevated creatine kinase 07/17/2014  . Nonintractable episodic headache  07/17/2014  . Erectile dysfunction 04/11/2014  . Hemorrhoids 04/05/2014  . Toe pain, right 04/05/2014  . Bilateral low back pain without sciatica 03/05/2014  . Left inguinal hernia 03/05/2014  . Bladder wall thickening 02/26/2014  . Dental infection 02/26/2014  . Tonsillitis 02/12/2014  . Dyspepsia 01/15/2014  . Generalized anxiety disorder 01/15/2014  . GERD (gastroesophageal reflux disease) 01/15/2014  . Screening for diabetes mellitus 01/15/2014    Past Surgical History:  Procedure Laterality Date  . COLONOSCOPY    . COLONOSCOPY W/ POLYPECTOMY  02/07/2015  . head surgery    . head surgery    . LAPAROSCOPY ABDOMEN DIAGNOSTIC  03/13/2015  . UPPER GASTROINTESTINAL ENDOSCOPY  04/29/2014    Prior to Admission medications   Medication Sig Start Date End Date Taking? Authorizing Provider  albuterol (PROVENTIL HFA;VENTOLIN HFA) 108 (90 Base) MCG/ACT inhaler Inhale 2 puffs into the lungs every 6 (six) hours as needed for wheezing or shortness of breath. Patient not taking: Reported on 12/18/2016 08/20/16   Darci Current, MD  albuterol (PROVENTIL HFA;VENTOLIN HFA) 108 (90 Base) MCG/ACT inhaler Inhale 2 puffs into the lungs every 6 (six) hours as needed for wheezing or shortness of breath. 12/12/16   Governor Rooks, MD  amLODipine (NORVASC) 10 MG tablet Take 1 tablet (10 mg total) by mouth daily. 09/08/16   Jeanmarie Plant, MD  amLODipine (NORVASC) 10 MG tablet Take 1 tablet (10 mg total) by mouth daily. 05/08/19 06/07/19  Miguel Aschoff., MD  azithromycin (ZITHROMAX) 250 MG tablet Take 1 tablet (250 mg total) by mouth daily. 06/02/17   Irean Hong, MD  famotidine (PEPCID) 20 MG tablet Take 20 mg by mouth 2 (two) times daily. 11/18/16   [provider]  famotidine (PEPCID) 20 MG tablet Take 1 tablet (20 mg total) 2 (two) times daily by mouth. 01/19/17   Sharman Cheek, MD  fluticasone [facility]) 50 MCG/ACT nasal spray Place 2 sprays into both nostrils daily. 02/23/16   Menshew,  Charlesetta Ivory, PA-C  hydrochlorothiazide (HYDRODIURIL) 25 MG tablet Take 1 tablet (25 mg total) by mouth daily. 09/08/16   Jeanmarie Plant, MD  hydrochlorothiazide (HYDRODIURIL) 25 MG tablet Take 1 tablet (25 mg total) by mouth daily. 05/08/19 06/07/19  Miguel Aschoff., MD  HYDROcodone-acetaminophen (NORCO) 5-325 MG tablet Take 1 tablet every 6 (six) hours as needed by mouth for moderate pain. 01/12/17   Irean Hong, MD  ibuprofen (ADVIL,MOTRIN) 600 MG tablet Take 1 tablet (600 mg total) by mouth every 8 (eight) hours as needed. 03/02/18   Merrily Brittle, MD  ipratropium (ATROVENT) 0.06 % nasal spray Place 2 sprays into the nose 3 (three) times daily. 03/02/18 03/02/19  Merrily Brittle, MD  lidocaine (LIDODERM) 5 % Place 1 patch onto the skin every 12 (twelve) hours. Remove & Discard patch within 12 hours or as directed by MD.  Wynelle Fanny the patch off for 12 hours before applying a new one. 09/27/18 09/27/19  Loleta Rose, MD  lidocaine (XYLOCAINE) 2 % solution Use as directed 15 mLs in the mouth or throat as needed for mouth pain. 01/01/19   Chesley Noon, MD  magic mouthwash SOLN Take 5 mLs by mouth 3 (three) times daily as needed  for mouth pain. 06/25/18   Irean Hong, MD  metoCLOPramide (REGLAN) 10 MG tablet Take 1 tablet (10 mg total) every 6 (six) hours as needed by mouth. 01/19/17   Sharman Cheek, MD  omeprazole (PRILOSEC OTC) 20 MG tablet Take 1 tablet (20 mg total) by mouth daily. 09/27/18 09/27/19  Loleta Rose, MD  omeprazole (PRILOSEC) 40 MG capsule Take 1 capsule (40 mg total) by mouth daily. Patient not taking: Reported on 12/18/2016 09/08/16 09/08/17  Jeanmarie Plant, MD  pantoprazole (PROTONIX) 40 MG tablet Take 1 tablet (40 mg total) by mouth daily. 02/12/18 02/12/19  Minna Antis, MD  pantoprazole (PROTONIX) 40 MG tablet Take 1 tablet (40 mg total) by mouth daily. 05/08/19 06/07/19  Miguel Aschoff., MD  potassium chloride SA (KLOR-CON M20) 20 MEQ tablet Take 1 tablet (20 mEq total) by  mouth daily. 09/24/17   Loleta Rose, MD  potassium chloride SA (KLOR-CON) 20 MEQ tablet Take 1 tablet (20 mEq total) by mouth daily. 05/08/19 06/07/19  Miguel Aschoff., MD  predniSONE (DELTASONE) 10 MG tablet Take 5 tablets (50 mg total) by mouth daily. Patient not taking: Reported on 12/18/2016 12/12/16   Governor Rooks, MD  sucralfate (CARAFATE) 1 g tablet Take 1 tablet (1 g total) by mouth 4 (four) times daily. 12/18/16 12/18/17  Governor Rooks, MD  sucralfate (CARAFATE) 1 g tablet Take 1 tablet (1 g total) 4 (four) times daily by mouth. 01/19/17   Sharman Cheek, MD    Allergies Other  Family History  Problem Relation Age of Onset  . Diabetes Mother   . Multiple sclerosis Brother   . Breast cancer Maternal Aunt   . Lung cancer Maternal Grandfather   . Stomach cancer Paternal Grandmother   . Colon cancer Maternal Uncle     Social History Social History   Tobacco Use  . Smoking status: Current Every Day Smoker    Packs/day: 0.25    Years: 10.00    Pack years: 2.50    Types: Cigarettes  . Smokeless tobacco: Never Used  Substance Use Topics  . Alcohol use: Yes    Comment: daily  . Drug use: No    Review of Systems Constitutional: No fever/chills Eyes: No visual changes. ENT: No sore throat. Cardiovascular: Denies chest pain. Respiratory: Denies shortness of breath. Gastrointestinal: Positive for abdominal pain.  No nausea, no vomiting.  No diarrhea.  No constipation. Genitourinary: Negative for dysuria. Musculoskeletal: Negative for neck pain.  Negative for back pain. Integumentary: Negative for rash. Neurological: Negative for headaches, focal weakness or numbness.   ____________________________________________   PHYSICAL EXAM:  VITAL SIGNS: ED Triage Vitals [05/10/19 0330]  Enc Vitals Group     BP 131/81     Pulse Rate 84     Resp 20     Temp 98.3 F (36.8 C)     Temp Source Oral     SpO2 97 %     Weight 95.3 kg (210 lb)     Height 1.753 m (5\' 9" )      Head Circumference      Peak Flow      Pain Score 10     Pain Loc      Pain Edu?      Excl. in GC?     Constitutional: Alert and oriented.  Eyes: Conjunctivae are normal.  Mouth/Throat: Patient is wearing a mask. Neck: No stridor.  No meningeal signs.   Cardiovascular: Normal rate, regular rhythm. Good peripheral circulation. Grossly normal  heart sounds. Respiratory: Normal respiratory effort.  No retractions. Gastrointestinal: Soft and nontender. No distention.  Musculoskeletal: No lower extremity tenderness nor edema. No gross deformities of extremities. Neurologic:  Normal speech and language. No gross focal neurologic deficits are appreciated.  Skin:  Skin is warm, dry and intact. Psychiatric: Mood and affect are normal. Speech and behavior are normal.  ____________________________________________   LABS (all labs ordered are listed, but only abnormal results are displayed)  Labs Reviewed  COMPREHENSIVE METABOLIC PANEL - Abnormal; Notable for the following components:      Result Value   Potassium 3.3 (*)    Glucose, Bld 121 (*)    All other components within normal limits  CBC  LIPASE, BLOOD  ETHANOL  URINALYSIS, COMPLETE (UACMP) WITH MICROSCOPIC  TROPONIN I (HIGH SENSITIVITY)   ____________________________________________  EKG  ED ECG REPORT I, Drummond N Tamicka Shimon, the attending physician, personally viewed and interpreted this ECG.   Date: 05/10/2019  EKG Time: 3:32 AM  Rate: 81  Rhythm: Normal sinus rhythm  Axis: Normal  Intervals: Normal  ST&T Change: None  _______________________________________  Procedures   ____________________________________________   INITIAL IMPRESSION / MDM / ASSESSMENT AND PLAN / ED COURSE  As part of my medical decision making, I reviewed the following data within the electronic MEDICAL RECORD NUMBER   59 year old male presented with above-stated history and physical exam consistent with previously diagnosed gastritis most  likely alcohol induced.  Patient given a GI cocktail in the emergency department.  Spoke with the patient regarding discontinuing consuming EtOH _____________________________  FINAL CLINICAL IMPRESSION(S) / ED DIAGNOSES  Final diagnoses:  Acute alcoholic gastritis without hemorrhage     MEDICATIONS GIVEN DURING THIS VISIT:  Medications  alum & mag hydroxide-simeth (MAALOX/MYLANTA) 200-200-20 MG/5ML suspension 30 mL (has no administration in time range)    And  lidocaine (XYLOCAINE) 2 % viscous mouth solution 15 mL (has no administration in time range)     ED Discharge Orders    None      *Please note:  Douglas Mcdaniel was evaluated in Emergency Department on 05/10/2019 for the symptoms described in the history of present illness. He was evaluated in the context of the global COVID-19 pandemic, which necessitated consideration that the patient might be at risk for infection with the SARS-CoV-2 virus that causes COVID-19. Institutional protocols and algorithms that pertain to the evaluation of patients at risk for COVID-19 are in a state of rapid change based on information released by regulatory bodies including the CDC and federal and state organizations. These policies and algorithms were followed during the patient's care in the ED.  Some ED evaluations and interventions may be delayed as a result of limited staffing during the pandemic.*  Note:  This document was prepared using Dragon voice recognition software and may include unintentional dictation errors.   Darci Current, MD 05/10/19 [phone]

## 2019-05-10 NOTE — ED Notes (Signed)
Pt reports pain that has been ongoing since 1200 yesterday. Reports pain is mire intense after eating. States he does take meds for indigestion and has been regularly taking them.States he did recently have "swollen bowels" and the pain is now radiating to LUQ and umbilical area.

## 2019-05-25 ENCOUNTER — Ambulatory Visit: Payer: BC Managed Care – PPO

## 2019-08-30 ENCOUNTER — Other Ambulatory Visit: Payer: Self-pay

## 2019-08-30 ENCOUNTER — Encounter: Payer: Self-pay | Admitting: Emergency Medicine

## 2019-08-30 ENCOUNTER — Emergency Department
Admission: EM | Admit: 2019-08-30 | Discharge: 2019-08-30 | Disposition: A | Discharge status: Home / Self Care | Payer: BC Managed Care – PPO | Attending: Emergency Medicine | Admitting: Emergency Medicine

## 2019-08-30 DIAGNOSIS — R1030 Lower abdominal pain, unspecified: Secondary | ICD-10-CM | POA: Diagnosis not present

## 2019-08-30 DIAGNOSIS — Z5321 Procedure and treatment not carried out due to patient leaving prior to being seen by health care provider: Secondary | ICD-10-CM | POA: Insufficient documentation

## 2019-08-30 DIAGNOSIS — R519 Headache, unspecified: Secondary | ICD-10-CM | POA: Insufficient documentation

## 2019-08-30 LAB — COMPREHENSIVE METABOLIC PANEL
ALT: 19 U/L (ref 0–44)
AST: 22 U/L (ref 15–41)
Albumin: 4.1 g/dL (ref 3.5–5.0)
Alkaline Phosphatase: 38 U/L (ref 38–126)
Anion gap: 9 (ref 5–15)
BUN: 10 mg/dL (ref 6–20)
CO2: 28 mmol/L (ref 22–32)
Calcium: 8.9 mg/dL (ref 8.9–10.3)
Chloride: 104 mmol/L (ref 98–111)
Creatinine, Ser: 1.11 mg/dL (ref 0.61–1.24)
GFR calc Af Amer: >60 mL/min (ref >60)
GFR calc non Af Amer: >60 mL/min (ref >60)
Glucose, Bld: 114 mg/dL — ABNORMAL HIGH (ref 70–99)
Potassium: 3.8 mmol/L (ref 3.5–5.1)
Sodium: 141 mmol/L (ref 135–145)
Total Bilirubin: 0.7 mg/dL (ref 0.3–1.2)
Total Protein: 7 g/dL (ref 6.5–8.1)

## 2019-08-30 LAB — URINALYSIS, COMPLETE (UACMP) WITH MICROSCOPIC
Bacteria, UA: NONE SEEN
Bilirubin Urine: NEGATIVE
Glucose, UA: NEGATIVE mg/dL
Hgb urine dipstick: NEGATIVE
Ketones, ur: NEGATIVE mg/dL
Leukocytes,Ua: NEGATIVE
Nitrite: NEGATIVE
Protein, ur: NEGATIVE mg/dL
Specific Gravity, Urine: 1.009 (ref 1.005–1.030)
pH: 8 (ref 5.0–8.0)

## 2019-08-30 LAB — CBC WITH DIFFERENTIAL/PLATELET
Abs Immature Granulocytes: 0.04 10*3/uL (ref 0.00–0.07)
Basophils Absolute: 0 10*3/uL (ref 0.0–0.1)
Basophils Relative: 1 %
Eosinophils Absolute: 0.1 10*3/uL (ref 0.0–0.5)
Eosinophils Relative: 2 %
HCT: 47.9 % (ref 39.0–52.0)
Hemoglobin: 16.2 g/dL (ref 13.0–17.0)
Immature Granulocytes: 1 %
Lymphocytes Relative: 34 %
Lymphs Abs: 1.8 10*3/uL (ref 0.7–4.0)
MCH: 30.4 pg (ref 26.0–34.0)
MCHC: 33.8 g/dL (ref 30.0–36.0)
MCV: 89.9 fL (ref 80.0–100.0)
Monocytes Absolute: 0.5 10*3/uL (ref 0.1–1.0)
Monocytes Relative: 9 %
Neutro Abs: 2.9 10*3/uL (ref 1.7–7.7)
Neutrophils Relative %: 53 %
Platelets: 244 10*3/uL (ref 150–400)
RBC: 5.33 MIL/uL (ref 4.22–5.81)
RDW: 14.2 % (ref 11.5–15.5)
WBC: 5.4 10*3/uL (ref 4.0–10.5)
nRBC: 0 % (ref 0.0–0.2)

## 2019-08-30 LAB — LIPASE, BLOOD: Lipase: 26 U/L (ref 11–51)

## 2019-08-30 NOTE — ED Triage Notes (Signed)
Patient ambulatory to triage with steady gait, without difficulty or distress noted; pt reports frontal HA and lower abd pain since yesterday with no accomp symptoms

## 2019-11-15 ENCOUNTER — Other Ambulatory Visit: Payer: Self-pay

## 2019-11-15 ENCOUNTER — Encounter: Payer: Self-pay | Admitting: Emergency Medicine

## 2019-11-15 ENCOUNTER — Emergency Department
Admission: EM | Admit: 2019-11-15 | Discharge: 2019-11-15 | Disposition: A | Discharge status: Home / Self Care | Payer: BC Managed Care – PPO | Attending: Emergency Medicine | Admitting: Emergency Medicine

## 2019-11-15 DIAGNOSIS — K2970 Gastritis, unspecified, without bleeding: Secondary | ICD-10-CM | POA: Insufficient documentation

## 2019-11-15 DIAGNOSIS — Z7951 Long term (current) use of inhaled steroids: Secondary | ICD-10-CM | POA: Diagnosis not present

## 2019-11-15 DIAGNOSIS — I10 Essential (primary) hypertension: Secondary | ICD-10-CM | POA: Insufficient documentation

## 2019-11-15 DIAGNOSIS — R1013 Epigastric pain: Secondary | ICD-10-CM | POA: Diagnosis not present

## 2019-11-15 DIAGNOSIS — R109 Unspecified abdominal pain: Secondary | ICD-10-CM | POA: Diagnosis present

## 2019-11-15 DIAGNOSIS — F17210 Nicotine dependence, cigarettes, uncomplicated: Secondary | ICD-10-CM | POA: Insufficient documentation

## 2019-11-15 LAB — COMPREHENSIVE METABOLIC PANEL
ALT: 27 U/L (ref 0–44)
AST: 26 U/L (ref 15–41)
Albumin: 4.7 g/dL (ref 3.5–5.0)
Alkaline Phosphatase: 49 U/L (ref 38–126)
Anion gap: 9 (ref 5–15)
BUN: 15 mg/dL (ref 6–20)
CO2: 26 mmol/L (ref 22–32)
Calcium: 9.4 mg/dL (ref 8.9–10.3)
Chloride: 102 mmol/L (ref 98–111)
Creatinine, Ser: 1.19 mg/dL (ref 0.61–1.24)
GFR calc Af Amer: >60 mL/min (ref >60)
GFR calc non Af Amer: >60 mL/min (ref >60)
Glucose, Bld: 123 mg/dL — ABNORMAL HIGH (ref 70–99)
Potassium: 3.5 mmol/L (ref 3.5–5.1)
Sodium: 137 mmol/L (ref 135–145)
Total Bilirubin: 0.9 mg/dL (ref 0.3–1.2)
Total Protein: 7.9 g/dL (ref 6.5–8.1)

## 2019-11-15 LAB — URINALYSIS, COMPLETE (UACMP) WITH MICROSCOPIC
Bacteria, UA: NONE SEEN
Bilirubin Urine: NEGATIVE
Glucose, UA: NEGATIVE mg/dL
Hgb urine dipstick: NEGATIVE
Ketones, ur: NEGATIVE mg/dL
Leukocytes,Ua: NEGATIVE
Nitrite: NEGATIVE
Protein, ur: NEGATIVE mg/dL
Specific Gravity, Urine: 1.001 — ABNORMAL LOW (ref 1.005–1.030)
WBC, UA: NONE SEEN WBC/hpf (ref 0–5)
pH: 7 (ref 5.0–8.0)

## 2019-11-15 LAB — CBC WITH DIFFERENTIAL/PLATELET
Abs Immature Granulocytes: 0.03 10*3/uL (ref 0.00–0.07)
Basophils Absolute: 0 10*3/uL (ref 0.0–0.1)
Basophils Relative: 1 %
Eosinophils Absolute: 0 10*3/uL (ref 0.0–0.5)
Eosinophils Relative: 1 %
HCT: 51.6 % (ref 39.0–52.0)
Hemoglobin: 17.6 g/dL — ABNORMAL HIGH (ref 13.0–17.0)
Immature Granulocytes: 1 %
Lymphocytes Relative: 34 %
Lymphs Abs: 2.1 10*3/uL (ref 0.7–4.0)
MCH: 30.6 pg (ref 26.0–34.0)
MCHC: 34.1 g/dL (ref 30.0–36.0)
MCV: 89.6 fL (ref 80.0–100.0)
Monocytes Absolute: 0.6 10*3/uL (ref 0.1–1.0)
Monocytes Relative: 9 %
Neutro Abs: 3.4 10*3/uL (ref 1.7–7.7)
Neutrophils Relative %: 54 %
Platelets: 236 10*3/uL (ref 150–400)
RBC: 5.76 MIL/uL (ref 4.22–5.81)
RDW: 13.4 % (ref 11.5–15.5)
WBC: 6.1 10*3/uL (ref 4.0–10.5)
nRBC: 0 % (ref 0.0–0.2)

## 2019-11-15 LAB — ETHANOL: Alcohol, Ethyl (B): <10 mg/dL (ref <10)

## 2019-11-15 LAB — LIPASE, BLOOD: Lipase: 29 U/L (ref 11–51)

## 2019-11-15 LAB — TROPONIN I (HIGH SENSITIVITY): Troponin I (High Sensitivity): 4 ng/L (ref <18)

## 2019-11-15 MED ORDER — SUCRALFATE 1 GM/10ML PO SUSP: 1.0000 g | Freq: Four times a day (QID) | ORAL | 1 refills | Status: AC

## 2019-11-15 NOTE — ED Triage Notes (Signed)
Patient ambulatory to triage with steady gait, without difficulty or distress noted; pt reports mid abd pain since yesterday with no accomp symptoms; pt with hx of same, hx ETOH

## 2019-11-15 NOTE — ED Provider Notes (Signed)
[facility] Emergency Department Provider Note   ____________________________________________  12 the night shift MD Initiated Contact with Patient 11/15/19 1308     (approximate)  I have reviewed the triage vital signs and the nursing notes.   HISTORY  Chief Complaint Abdominal Pain    HPI Douglas Mcdaniel is a 59 y.o. male with a stated past medical history of chronic midepigastric abdominal pain, alcohol abuse, and GERD who presents for midepigastric abdominal pain.  Patient describes 6/10, nonradiating, burning, heavy pain that is exacerbated with liquor consumption.  Patient denies any nausea/vomiting/diarrhea.  Patient states that this is similar to midepigastric and reflux pain that he has had in the past especially after liquid consumption.         Past Medical History:  Diagnosis Date  . Bilateral low back pain without sciatica 03/05/2014  . Bladder wall thickening 02/26/2014   Last Assessment & Plan:  - CT with bladder wall thickening possibly related to infection v. Neoplasm. - UA and urine cytology pending. - Urology referral for cystoscopy.  . Chronic abdominal pain 08/13/2015   Overview:  Chart Review:.  03/13/15- He underwent an exploratory laparotomy with all examined abdominal contents appearing normal including the entirety of the small bowel, appendix and colon, with no evidence of internal hernia. 03/17/15- CT: Segmental wall thickening in the proximal small bowel with mild dilatation, possibly enteritis, less likely obstruction. Correlate with recent laparoscopy.Smal  . 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 Anxiety. - Will continue to follow.  . Dental infection 02/26/2014  . Dyspepsia 01/15/2014   Last Assessment & Plan:  - Chronic, stable. - Refilled Pepcid.  . Elevated creatine kinase 07/17/2014   Last Assessment & Plan:  - Trending up 216 --> 330 --> 407 --> 295. - Possibly related to  muscle strain and dehydration with new job working outdoors.  Other differential includes inflammatory myopathy (dermatomyositis v. Polymyositis) v. v. Medication effect.  Unlikely hypothyroidism as normal TSH. - Denies alcohol and cocaine use. - Continue to monitor.  [name] Epidermal inclusion cyst 08/13/2015   Last Assessment & Plan:  Discussed excision of the cyst in the future. Patient is in agreement.   . Erectile dysfunction 04/11/2014  . Generalized anxiety disorder 01/15/2014   Last Assessment & Plan:  - Counseled on importance of re-starting Zoloft 50 mg once daily. - Concern that patient has been presenting to the ED multiple times for array of symptoms likely related to anxiety. - Encouraged to make an appointment at [facility] if patient feels like he needs to go to ED as we have same-day appointments available.  Patient in agreement. - Close follow-up at [facility] in one week.  [name] GERD (gastroesophageal reflux disease)   . Hemorrhoids 04/05/2014  . Hypertension   . Left inguinal hernia 03/05/2014  . Nonintractable episodic headache 07/17/2014   Last Assessment & Plan:  - Concern that patient has been presenting to the ED with 7 visits since 3/2 for similar symptoms.  Has had multiple rounds of antibiotics.  See chart review in subjective section. - Encouraged to make an appointment at [facility] if patient feels like he needs to go to ED as we have same-day appointments available.  Patient in agreement. - Recommend continuing Flonase and using   . Screening for diabetes mellitus 01/15/2014  . Tobacco use 09/16/2014  . Toe pain, right 04/05/2014  . Tonsillitis 02/12/2014    Patient Active Problem List  Diagnosis Date Noted  . Hypertension 04/26/2016  . Chronic abdominal pain 08/13/2015  . Epidermal inclusion cyst 08/13/2015  . Tobacco use 09/16/2014  . 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 Elevated creatine kinase 07/17/2014  . Nonintractable episodic headache 07/17/2014  . Erectile dysfunction 04/11/2014  .  Hemorrhoids 04/05/2014  . Toe pain, right 04/05/2014  . Bilateral low back pain without sciatica 03/05/2014  . Left inguinal hernia 03/05/2014  . Bladder wall thickening 02/26/2014  . Dental infection 02/26/2014  . Tonsillitis 02/12/2014  . Dyspepsia 01/15/2014  . Generalized anxiety disorder 01/15/2014  . GERD (gastroesophageal reflux disease) 01/15/2014  . Screening for diabetes mellitus 01/15/2014    Past Surgical History:  Procedure Laterality Date  . COLONOSCOPY    . COLONOSCOPY W/ POLYPECTOMY  02/07/2015  . head surgery    . head surgery    . LAPAROSCOPY ABDOMEN DIAGNOSTIC  03/13/2015  . UPPER GASTROINTESTINAL ENDOSCOPY  04/29/2014    Prior to Admission medications   Medication Sig Start Date End Date Taking? Authorizing Provider  albuterol (PROVENTIL HFA;VENTOLIN HFA) 108 (90 Base) MCG/ACT inhaler Inhale 2 puffs into the lungs every 6 (six) hours as needed for wheezing or shortness of breath. Patient not taking: Reported on 12/18/2016 08/20/16   Darci Current, MD  albuterol (PROVENTIL HFA;VENTOLIN HFA) 108 (90 Base) MCG/ACT inhaler Inhale 2 puffs into the lungs every 6 (six) hours as needed for wheezing or shortness of breath. 12/12/16   Governor Rooks, MD  amLODipine (NORVASC) 10 MG tablet Take 1 tablet (10 mg total) by mouth daily. 09/08/16   Jeanmarie Plant, MD  amLODipine (NORVASC) 10 MG tablet Take 1 tablet (10 mg total) by mouth daily. 05/08/19 06/07/19  Miguel Aschoff., MD  azithromycin (ZITHROMAX) 250 MG tablet Take 1 tablet (250 mg total) by mouth daily. 06/02/17   Irean Hong, MD  famotidine (PEPCID) 20 MG tablet Take 20 mg by mouth 2 (two) times daily. 11/18/16   [provider]  famotidine (PEPCID) 20 MG tablet Take 1 tablet (20 mg total) 2 (two) times daily by mouth. 01/19/17   Sharman Cheek, MD  fluticasone [facility]) 50 MCG/ACT nasal spray Place 2 sprays into both nostrils daily. 02/23/16   Menshew, Charlesetta Ivory, PA-C  hydrochlorothiazide (HYDRODIURIL)  25 MG tablet Take 1 tablet (25 mg total) by mouth daily. 09/08/16   Jeanmarie Plant, MD  hydrochlorothiazide (HYDRODIURIL) 25 MG tablet Take 1 tablet (25 mg total) by mouth daily. 05/08/19 06/07/19  Miguel Aschoff., MD  HYDROcodone-acetaminophen (NORCO) 5-325 MG tablet Take 1 tablet every 6 (six) hours as needed by mouth for moderate pain. 01/12/17   Irean Hong, MD  ibuprofen (ADVIL,MOTRIN) 600 MG tablet Take 1 tablet (600 mg total) by mouth every 8 (eight) hours as needed. 03/02/18   Merrily Brittle, MD  ipratropium (ATROVENT) 0.06 % nasal spray Place 2 sprays into the nose 3 (three) times daily. 03/02/18 03/02/19  Merrily Brittle, MD  lidocaine (XYLOCAINE) 2 % solution Use as directed 15 mLs in the mouth or throat as needed for mouth pain. 01/01/19   Chesley Noon, MD  magic mouthwash SOLN Take 5 mLs by mouth 3 (three) times daily as needed for mouth pain. 06/25/18   Irean Hong, MD  metoCLOPramide (REGLAN) 10 MG tablet Take 1 tablet (10 mg total) every 6 (six) hours as needed by mouth. 01/19/17   Sharman Cheek, MD  omeprazole (PRILOSEC OTC) 20 MG tablet Take 1 tablet (20 mg total) by mouth daily.  09/27/18 09/27/19  Loleta Rose, MD  omeprazole (PRILOSEC) 40 MG capsule Take 1 capsule (40 mg total) by mouth daily. Patient not taking: Reported on 12/18/2016 09/08/16 09/08/17  Jeanmarie Plant, MD  pantoprazole (PROTONIX) 40 MG tablet Take 1 tablet (40 mg total) by mouth daily. 02/12/18 02/12/19  Minna Antis, MD  pantoprazole (PROTONIX) 40 MG tablet Take 1 tablet (40 mg total) by mouth daily. 05/08/19 06/07/19  Miguel Aschoff., MD  potassium chloride SA (KLOR-CON M20) 20 MEQ tablet Take 1 tablet (20 mEq total) by mouth daily. 09/24/17   Loleta Rose, MD  potassium chloride SA (KLOR-CON) 20 MEQ tablet Take 1 tablet (20 mEq total) by mouth daily. 05/08/19 06/07/19  Miguel Aschoff., MD  predniSONE (DELTASONE) 10 MG tablet Take 5 tablets (50 mg total) by mouth daily. Patient not taking: Reported on 12/18/2016  12/12/16   Governor Rooks, MD  sucralfate (CARAFATE) 1 GM/10ML suspension Take 10 mLs (1 g total) by mouth 4 (four) times daily. 11/15/19 11/14/20  Merwyn Katos, MD    Allergies Other  Family History  Problem Relation Age of Onset  . Diabetes Mother   . Multiple sclerosis Brother   . Breast cancer Maternal Aunt   . Lung cancer Maternal Grandfather   . Stomach cancer Paternal Grandmother   . Colon cancer Maternal Uncle     Social History Social History   Tobacco Use  . Smoking status: Current Every Day Smoker    Packs/day: 0.25    Years: 10.00    Pack years: 2.50    Types: Cigarettes  . Smokeless tobacco: Never Used  Substance Use Topics  . Alcohol use: Yes    Comment: daily  . Drug use: No    Review of Systems Constitutional: No fever/chills Eyes: No visual changes. ENT: No sore throat. Cardiovascular: Denies chest pain. Respiratory: Denies shortness of breath. Gastrointestinal: Endorses abdominal pain.  No nausea, no vomiting.  No diarrhea. Genitourinary: Negative for dysuria. Musculoskeletal: Negative for acute arthralgias Skin: Negative for rash. Neurological: Negative for headaches, weakness/numbness/paresthesias in any extremity Psychiatric: Negative for suicidal ideation/homicidal ideation   ____________________________________________   PHYSICAL EXAM:  VITAL SIGNS: ED Triage Vitals  Enc Vitals Group     BP 11/15/19 0312 (!) 151/91     Pulse Rate 11/15/19 0312 (!) 105     Resp 11/15/19 0312 18     Temp 11/15/19 0312 98.2 F (36.8 C)     Temp Source 11/15/19 0312 Oral     SpO2 11/15/19 0312 97 %     Weight 11/15/19 0259 210 lb (95.3 kg)     Height 11/15/19 0259 5\' 9"  (1.753 m)     Head Circumference --      Peak Flow --      Pain Score 11/15/19 0259 10     Pain Loc --      Pain Edu? --      Excl. in GC? --    Constitutional: Alert and oriented. Well appearing and in no acute distress. Eyes: Conjunctivae are normal. PERRL. EOMI. Head:  Atraumatic. Nose: No congestion/rhinnorhea. Mouth/Throat: Mucous membranes are moist.  Oropharynx non-erythematous. Neck: No stridor Cardiovascular: Normal rate, regular rhythm. Grossly normal heart sounds.  Good peripheral circulation. Respiratory: Normal respiratory effort.  No retractions. Lungs CTAB. Gastrointestinal: Mild epigastric tenderness to palpation. No distention. No abdominal bruits. No CVA tenderness. Musculoskeletal: No lower extremity tenderness nor edema.  No joint effusions. Neurologic:  Normal speech and language. No gross focal neurologic deficits  are appreciated. No gait instability. Skin:  Skin is warm, dry and intact. No rash noted. Psychiatric: Mood and affect are normal. Speech and behavior are normal.  ____________________________________________   LABS (all labs ordered are listed, but only abnormal results are displayed)  Labs Reviewed  CBC WITH DIFFERENTIAL/PLATELET - Abnormal; Notable for the following components:      Result Value   Hemoglobin 17.6 (*)    All other components within normal limits  COMPREHENSIVE METABOLIC PANEL - Abnormal; Notable for the following components:   Glucose, Bld 123 (*)    All other components within normal limits  URINALYSIS, COMPLETE (UACMP) WITH MICROSCOPIC - Abnormal; Notable for the following components:   Color, Urine COLORLESS (*)    APPearance CLEAR (*)    Specific Gravity, Urine 1.001 (*)    All other components within normal limits  LIPASE, BLOOD  ETHANOL  TROPONIN I (HIGH SENSITIVITY)   ____________________________________________  EKG  ED ECG REPORT I, Merwyn KatosEvan K Pahoua Schreiner, the attending physician, personally viewed and interpreted this ECG.  Date: 11/15/2019 EKG Time: 423 Rate: 92 Rhythm: normal sinus rhythm QRS Axis: normal Intervals: normal ST/T Wave abnormalities: normal Narrative Interpretation: no evidence of acute ischemia  ____________________________________________  RADIOLOGY  ED MD  interpretation:    Official radiology report(s): No results found.  ____________________________________________   PROCEDURES  Procedure(s) performed (including Critical Care):  Procedures   ____________________________________________   INITIAL IMPRESSION / ASSESSMENT AND PLAN / ED COURSE        Patient presents for abdominal pain.  Differential diagnosis includes appendicitis, abdominal aortic aneurysm, surgical biliary disease, pancreatitis, SBO, mesenteric ischemia, serious intra-abdominal bacterial illness, genital torsion. Doubt atypical ACS. Based on history, physical exam, radiologic/laboratory evaluation, there is no red flag results or symptomatology requiring emergent intervention or need for admission at this time Pt tolerating PO. Disposition: Patient will be discharged with strict return precautions and follow up with primary MD within 12-24 hours for further evaluation. Patient understands that this still may have an early presentation of an emergent medical condition such as appendicitis that will require a recheck.      ____________________________________________   FINAL CLINICAL IMPRESSION(S) / ED DIAGNOSES  Final diagnoses:  Midepigastric pain  Gastritis without bleeding, unspecified chronicity, unspecified gastritis type     ED Discharge Orders         Ordered    sucralfate (CARAFATE) 1 GM/10ML suspension  4 times daily        11/15/19 1340           Note:  This document was prepared using Dragon voice recognition software and may include unintentional dictation errors.   Merwyn KatosBradler, Tameko Halder K, MD 11/15/19 [id number]

## 2020-03-16 IMAGING — CR DG CHEST 2V
2 series · 2 of 2 positions shown · non-contrast
Comparison: Chest radiograph dated 03/02/2018

CLINICAL DATA: 58-year-old male with chest pain.

EXAM:
CHEST - 2 VIEW

[chest pa]
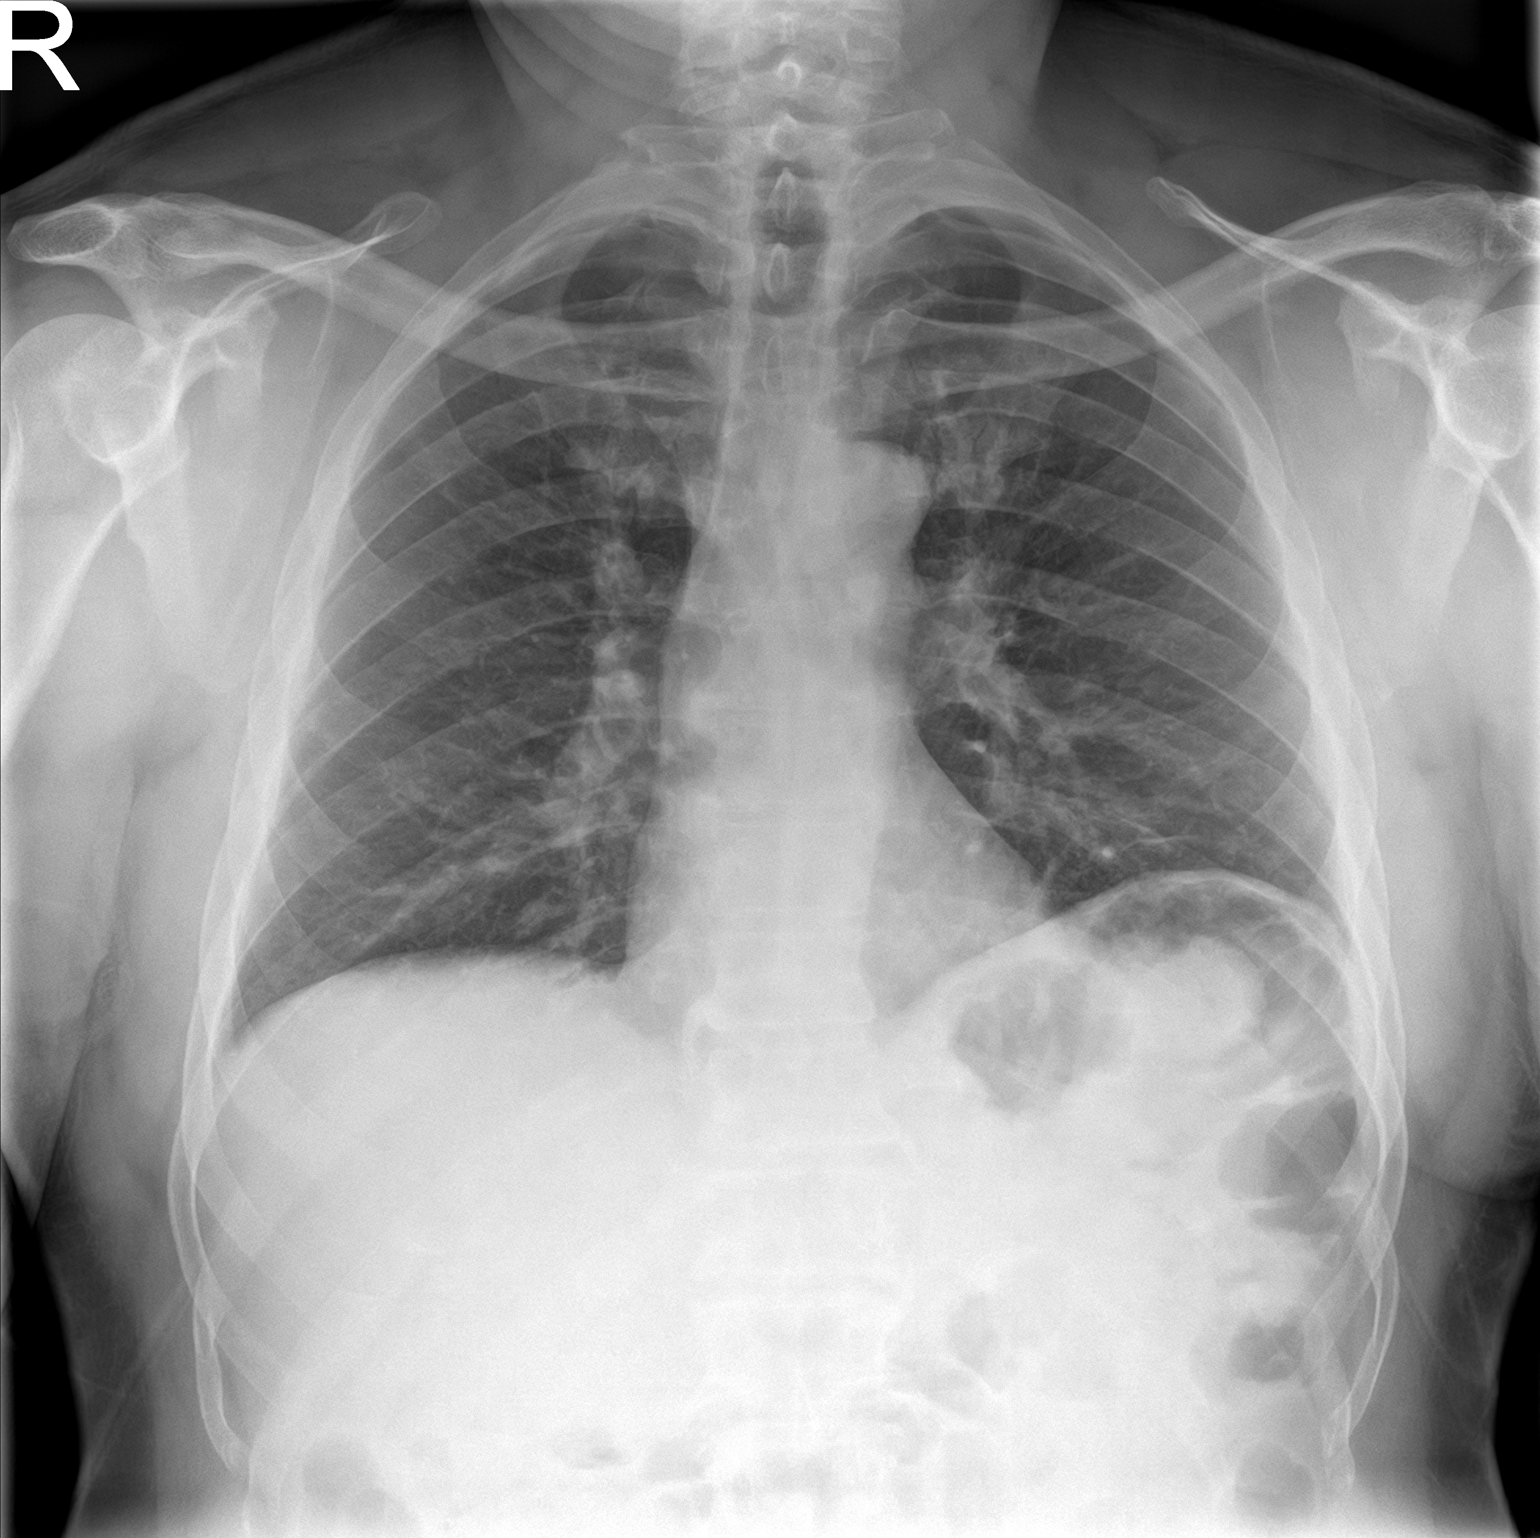

[chest lat]
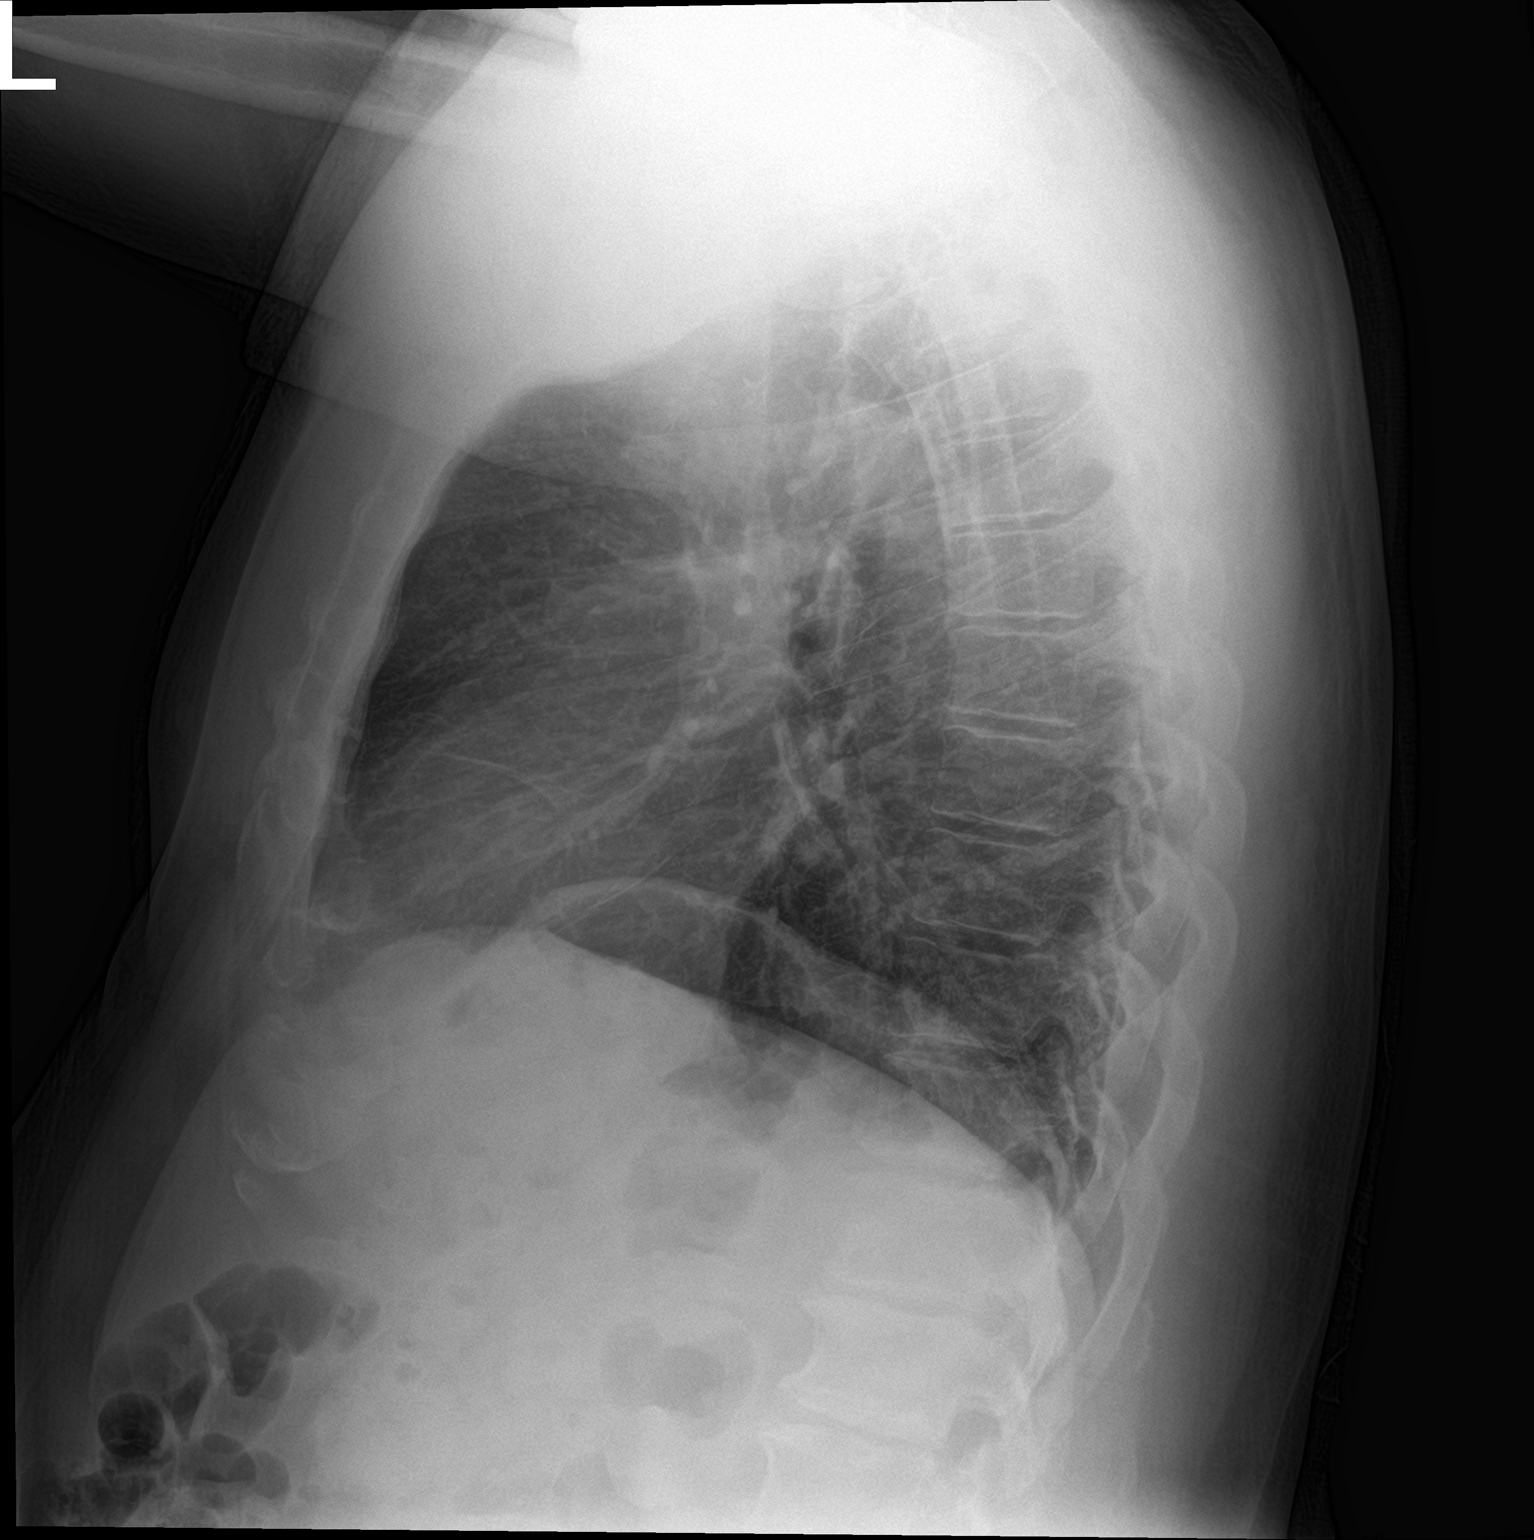

[2 of 2 positions shown; findings below may reference images not displayed]

FINDINGS: Minimal streaky densities in the infrahilar regions, likely
atelectatic changes. Developing infiltrate is less likely. Clinical
correlation is recommended. No focal consolidation, pleural
effusion, or pneumothorax. The cardiac silhouette is within normal
limits. No acute osseous pathology.
IMPRESSION: Bilateral infrahilar atelectasis.  No focal consolidation.

## 2020-04-04 ENCOUNTER — Emergency Department
Admission: EM | Admit: 2020-04-04 | Discharge: 2020-04-04 | Disposition: A | Discharge status: Home / Self Care | Payer: BC Managed Care – PPO | Attending: Emergency Medicine | Admitting: Emergency Medicine

## 2020-04-04 ENCOUNTER — Encounter: Payer: Self-pay | Admitting: Emergency Medicine

## 2020-04-04 ENCOUNTER — Emergency Department: Payer: BC Managed Care – PPO

## 2020-04-04 ENCOUNTER — Other Ambulatory Visit: Payer: Self-pay

## 2020-04-04 DIAGNOSIS — Z79899 Other long term (current) drug therapy: Secondary | ICD-10-CM | POA: Insufficient documentation

## 2020-04-04 DIAGNOSIS — F17210 Nicotine dependence, cigarettes, uncomplicated: Secondary | ICD-10-CM | POA: Diagnosis not present

## 2020-04-04 DIAGNOSIS — I10 Essential (primary) hypertension: Secondary | ICD-10-CM | POA: Diagnosis not present

## 2020-04-04 DIAGNOSIS — R079 Chest pain, unspecified: Secondary | ICD-10-CM | POA: Diagnosis present

## 2020-04-04 DIAGNOSIS — F1910 Other psychoactive substance abuse, uncomplicated: Secondary | ICD-10-CM | POA: Insufficient documentation

## 2020-04-04 DIAGNOSIS — R0789 Other chest pain: Secondary | ICD-10-CM | POA: Diagnosis not present

## 2020-04-04 LAB — CBC WITH DIFFERENTIAL/PLATELET
Abs Immature Granulocytes: 0.02 10*3/uL (ref 0.00–0.07)
Basophils Absolute: 0 10*3/uL (ref 0.0–0.1)
Basophils Relative: 0 %
Eosinophils Absolute: 0.1 10*3/uL (ref 0.0–0.5)
Eosinophils Relative: 1 %
HCT: 50.1 % (ref 39.0–52.0)
Hemoglobin: 16.7 g/dL (ref 13.0–17.0)
Immature Granulocytes: 0 %
Lymphocytes Relative: 35 %
Lymphs Abs: 2.4 10*3/uL (ref 0.7–4.0)
MCH: 30.3 pg (ref 26.0–34.0)
MCHC: 33.3 g/dL (ref 30.0–36.0)
MCV: 90.9 fL (ref 80.0–100.0)
Monocytes Absolute: 0.7 10*3/uL (ref 0.1–1.0)
Monocytes Relative: 10 %
Neutro Abs: 3.7 10*3/uL (ref 1.7–7.7)
Neutrophils Relative %: 54 %
Platelets: 230 10*3/uL (ref 150–400)
RBC: 5.51 MIL/uL (ref 4.22–5.81)
RDW: 12.8 % (ref 11.5–15.5)
WBC: 7 10*3/uL (ref 4.0–10.5)
nRBC: 0 % (ref 0.0–0.2)

## 2020-04-04 LAB — COMPREHENSIVE METABOLIC PANEL
ALT: 21 U/L (ref 0–44)
AST: 24 U/L (ref 15–41)
Albumin: 4.4 g/dL (ref 3.5–5.0)
Alkaline Phosphatase: 43 U/L (ref 38–126)
Anion gap: 13 (ref 5–15)
BUN: 11 mg/dL (ref 6–20)
CO2: 28 mmol/L (ref 22–32)
Calcium: 9.2 mg/dL (ref 8.9–10.3)
Chloride: 100 mmol/L (ref 98–111)
Creatinine, Ser: 0.94 mg/dL (ref 0.61–1.24)
GFR, Estimated: >60 mL/min (ref >60)
Glucose, Bld: 107 mg/dL — ABNORMAL HIGH (ref 70–99)
Potassium: 3.5 mmol/L (ref 3.5–5.1)
Sodium: 141 mmol/L (ref 135–145)
Total Bilirubin: 0.6 mg/dL (ref 0.3–1.2)
Total Protein: 7.7 g/dL (ref 6.5–8.1)

## 2020-04-04 LAB — TROPONIN I (HIGH SENSITIVITY)
Troponin I (High Sensitivity): 4 ng/L (ref <18)
Troponin I (High Sensitivity): 10 ng/L (ref <18)

## 2020-04-04 NOTE — ED Triage Notes (Signed)
Patient ambulatory to triage with steady gait, without difficulty or distress noted; pt reports awoke with left sided CP radiating into back with no accomp symptoms

## 2020-04-04 NOTE — ED Provider Notes (Signed)
[facility] Emergency Department Provider Note  ____________________________________________   Event Date/Time   First MD Initiated Contact with Patient 04/04/20 [id number]     (approximate)  I have reviewed the triage vital signs and the nursing notes.   HISTORY  Chief Complaint Chest Pain    HPI Douglas Mcdaniel is a 60 y.o. male with history of substance abuse, gastritis, hypertension, tobacco use who presents to the emergency department with left-sided chest pain that he describes as a "jump" that woke him from sleep that occurred prior to arrival.  States he is now asymptomatic.  No pressure or tightness.  No shortness of breath.  No nausea or vomiting.  No lower extremity swelling or pain.  No history of ACS, PE, DVT.  No fever or cough.  Does have nasal congestion after snorting cocaine tonight.  Does report drinking alcohol as well.  He states he thinks symptoms may have been due to gas and is asking what he can take at home for gas.  He has no abdominal pain.        Past Medical History:  Diagnosis Date  . Bilateral low back pain without sciatica 03/05/2014  . Bladder wall thickening 02/26/2014   Last Assessment & Plan:  - CT with bladder wall thickening possibly related to infection v. Neoplasm. - UA and urine cytology pending. - Urology referral for cystoscopy.  . Chronic abdominal pain 08/13/2015   Overview:  Chart Review:.  03/13/15- He underwent an exploratory laparotomy with all examined abdominal contents appearing normal including the entirety of the small bowel, appendix and colon, with no evidence of internal hernia. 03/17/15- CT: Segmental wall thickening in the proximal small bowel with mild dilatation, possibly enteritis, less likely obstruction. Correlate with recent laparoscopy.Smal  . 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 Anxiety. - Will continue to follow.  . Dental infection 02/26/2014  .  Dyspepsia 01/15/2014   Last Assessment & Plan:  - Chronic, stable. - Refilled Pepcid.  . Elevated creatine kinase 07/17/2014   Last Assessment & Plan:  - Trending up 216 --> 330 --> 407 --> 295. 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  Other differential includes inflammatory myopathy (dermatomyositis v. Polymyositis) v. v. Medication effect.  Unlikely hypothyroidism as normal TSH. - Denies alcohol and cocaine use. - Continue to monitor.  [name] Epidermal inclusion cyst 08/13/2015   Last Assessment & Plan:  Discussed excision of the cyst in the future. Patient is in agreement.   . Erectile dysfunction 04/11/2014  . Generalized anxiety disorder 01/15/2014   Last Assessment & Plan:  - Counseled on importance of re-starting Zoloft 50 mg once daily. - Concern that patient has been presenting to the ED multiple times for array of symptoms likely related to anxiety. - Encouraged to make an appointment at [facility] if patient feels like he needs to go to ED as we have same-day appointments available.  Patient in agreement. - Close follow-up at [facility] in one week.  [name] GERD (gastroesophageal reflux disease)   . Hemorrhoids 04/05/2014  . Hypertension   . Left inguinal hernia 03/05/2014  . Nonintractable episodic headache 07/17/2014   Last Assessment & Plan:  - Concern that patient has been presenting to the ED with 7 visits since 3/2 for similar symptoms.  Has had multiple rounds of antibiotics.  See chart review in subjective section. - Encouraged to make an appointment at  [facility] if patient feels like he needs to go to ED as we have same-day appointments available.  Patient in agreement. - Recommend continuing Flonase and using   . Screening for diabetes mellitus 01/15/2014  . Tobacco use 09/16/2014  . Toe pain, right 04/05/2014  . Tonsillitis 02/12/2014    Patient Active Problem List   Diagnosis Date Noted  . Hypertension 04/26/2016  . Chronic abdominal pain 08/13/2015  . Epidermal  inclusion cyst 08/13/2015  . Tobacco use 09/16/2014  . 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 Elevated creatine kinase 07/17/2014  . Nonintractable episodic headache 07/17/2014  . Erectile dysfunction 04/11/2014  . Hemorrhoids 04/05/2014  . Toe pain, right 04/05/2014  . Bilateral low back pain without sciatica 03/05/2014  . Left inguinal hernia 03/05/2014  . Bladder wall thickening 02/26/2014  . Dental infection 02/26/2014  . Tonsillitis 02/12/2014  . Dyspepsia 01/15/2014  . Generalized anxiety disorder 01/15/2014  . GERD (gastroesophageal reflux disease) 01/15/2014  . Screening for diabetes mellitus 01/15/2014    Past Surgical History:  Procedure Laterality Date  . COLONOSCOPY    . COLONOSCOPY W/ POLYPECTOMY  02/07/2015  . head surgery    . head surgery    . LAPAROSCOPY ABDOMEN DIAGNOSTIC  03/13/2015  . UPPER GASTROINTESTINAL ENDOSCOPY  04/29/2014    Prior to Admission medications   Medication Sig Start Date End Date Taking? Authorizing Provider  albuterol (PROVENTIL HFA;VENTOLIN HFA) 108 (90 Base) MCG/ACT inhaler Inhale 2 puffs into the lungs every 6 (six) hours as needed for wheezing or shortness of breath. 12/12/16   Governor Rooks, MD  amLODipine (NORVASC) 10 MG tablet Take 1 tablet (10 mg total) by mouth daily. 09/08/16   Jeanmarie Plant, MD  amLODipine (NORVASC) 10 MG tablet Take 1 tablet (10 mg total) by mouth daily. 05/08/19 06/07/19  Miguel Aschoff., MD  famotidine (PEPCID) 20 MG tablet Take 20 mg by mouth 2 (two) times daily. 11/18/16   [provider]  famotidine (PEPCID) 20 MG tablet Take 1 tablet (20 mg total) 2 (two) times daily by mouth. 01/19/17   Sharman Cheek, MD  fluticasone [facility]) 50 MCG/ACT nasal spray Place 2 sprays into both nostrils daily. 02/23/16   Menshew, Charlesetta Ivory, PA-C  hydrochlorothiazide (HYDRODIURIL) 25 MG tablet Take 1 tablet (25 mg total) by mouth daily. 09/08/16   Jeanmarie Plant, MD  hydrochlorothiazide (HYDRODIURIL) 25 MG tablet Take  1 tablet (25 mg total) by mouth daily. 05/08/19 06/07/19  Miguel Aschoff., MD  HYDROcodone-acetaminophen (NORCO) 5-325 MG tablet Take 1 tablet every 6 (six) hours as needed by mouth for moderate pain. 01/12/17   Irean Hong, MD  ibuprofen (ADVIL,MOTRIN) 600 MG tablet Take 1 tablet (600 mg total) by mouth every 8 (eight) hours as needed. 03/02/18   Merrily Brittle, MD  ipratropium (ATROVENT) 0.06 % nasal spray Place 2 sprays into the nose 3 (three) times daily. 03/02/18 03/02/19  Merrily Brittle, MD  lidocaine (XYLOCAINE) 2 % solution Use as directed 15 mLs in the mouth or throat as needed for mouth pain. 01/01/19   Chesley Noon, MD  magic mouthwash SOLN Take 5 mLs by mouth 3 (three) times daily as needed for mouth pain. 06/25/18   Irean Hong, MD  metoCLOPramide (REGLAN) 10 MG tablet Take 1 tablet (10 mg total) every 6 (six) hours as needed by mouth. 01/19/17   Sharman Cheek, MD  omeprazole (PRILOSEC OTC) 20 MG tablet Take 1 tablet (20 mg total) by mouth daily. 09/27/18 09/27/19  York Cerise,  Kandee Keen, MD  pantoprazole (PROTONIX) 40 MG tablet Take 1 tablet (40 mg total) by mouth daily. 02/12/18 02/12/19  Minna Antis, MD  pantoprazole (PROTONIX) 40 MG tablet Take 1 tablet (40 mg total) by mouth daily. 05/08/19 06/07/19  Miguel Aschoff., MD  potassium chloride SA (KLOR-CON M20) 20 MEQ tablet Take 1 tablet (20 mEq total) by mouth daily. 09/24/17   Loleta Rose, MD  potassium chloride SA (KLOR-CON) 20 MEQ tablet Take 1 tablet (20 mEq total) by mouth daily. 05/08/19 06/07/19  Miguel Aschoff., MD  sucralfate (CARAFATE) 1 GM/10ML suspension Take 10 mLs (1 g total) by mouth 4 (four) times daily. 11/15/19 11/14/20  Merwyn Katos, MD    Allergies Other  Family History  Problem Relation Age of Onset  . Diabetes Mother   . Multiple sclerosis Brother   . Breast cancer Maternal Aunt   . Lung cancer Maternal Grandfather   . Stomach cancer Paternal Grandmother   . Colon cancer Maternal Uncle     Social  History Social History   Tobacco Use  . Smoking status: Current Every Day Smoker    Packs/day: 0.25    Years: 10.00    Pack years: 2.50    Types: Cigarettes  . Smokeless tobacco: Never Used  Substance Use Topics  . Alcohol use: Yes    Comment: daily  . Drug use: No    Review of Systems Constitutional: No fever. Eyes: No visual changes. ENT: No sore throat. Cardiovascular: + chest pain. Respiratory: Denies shortness of breath. Gastrointestinal: No nausea, vomiting, diarrhea. Genitourinary: Negative for dysuria. Musculoskeletal: Negative for back pain. Skin: Negative for rash. Neurological: Negative for focal weakness or numbness.  ____________________________________________   PHYSICAL EXAM:  VITAL SIGNS: ED Triage Vitals  Enc Vitals Group     BP 04/04/20 0404 125/77     Pulse Rate 04/04/20 0404 78     Resp 04/04/20 0404 18     Temp 04/04/20 0404 98 F (36.7 C)     Temp Source 04/04/20 0404 Oral     SpO2 04/04/20 0404 95 %     Weight 04/04/20 0402 210 lb (95.3 kg)     Height 04/04/20 0402 5\' 9"  (1.753 m)     Head Circumference --      Peak Flow --      Pain Score 04/04/20 0402 8     Pain Loc --      Pain Edu? --      Excl. in GC? --    CONSTITUTIONAL: Alert and oriented and responds appropriately to questions. Well-appearing; well-nourished, appears intoxicated HEAD: Normocephalic EYES: Conjunctivae clear, pupils appear equal, EOM appear intact ENT: normal nose; moist mucous membranes NECK: Supple, normal ROM CARD: RRR; S1 and S2 appreciated; no murmurs, no clicks, no rubs, no gallops RESP: Normal chest excursion without splinting or tachypnea; breath sounds clear and equal bilaterally; no wheezes, no rhonchi, no rales, no hypoxia or respiratory distress, speaking full sentences ABD/GI: Normal bowel sounds; non-distended; soft, non-tender, no rebound, no guarding, no peritoneal signs, no hepatosplenomegaly BACK: The back appears normal EXT: Normal ROM in  all joints; no deformity noted, no edema; no cyanosis, no calf tenderness or calf swelling SKIN: Normal color for age and race; warm; no rash on exposed skin NEURO: Moves all extremities equally PSYCH: The patient's mood and manner are appropriate.  ____________________________________________   LABS (all labs ordered are listed, but only abnormal results are displayed)  Labs Reviewed  COMPREHENSIVE METABOLIC PANEL - Abnormal;  Notable for the following components:      Result Value   Glucose, Bld 107 (*)    All other components within normal limits  CBC WITH DIFFERENTIAL/PLATELET  TROPONIN I (HIGH SENSITIVITY)  TROPONIN I (HIGH SENSITIVITY)   ____________________________________________  EKG   EKG Interpretation  Date/Time:  Friday April 04 2020 03:54:41 EST Ventricular Rate:  75 PR Interval:  156 QRS Duration: 84 QT Interval:  390 QTC Calculation: 435 R Axis:   70 Text Interpretation: Normal sinus rhythm Normal ECG Confirmed by Rochele Raring [phone]) on 04/04/2020 6:29:18 AM       ____________________________________________  RADIOLOGY Normajean Baxter Jes Costales, personally viewed and evaluated these images (plain radiographs) as part of my medical decision making, as well as reviewing the written report by the radiologist.  ED MD interpretation: Chest x-ray clear.  Official radiology report(s): DG Chest 2 View  Result Date: 04/04/2020 CLINICAL DATA:  Left chest pain EXAM: CHEST - 2 VIEW COMPARISON:  01/01/2019 FINDINGS: The heart size and mediastinal contours are within normal limits. Both lungs are clear. The visualized skeletal structures are unremarkable. IMPRESSION: No active cardiopulmonary disease. Electronically Signed   By: Helyn Numbers MD   On: 04/04/2020 04:13    ____________________________________________   PROCEDURES  Procedure(s) performed (including Critical Care):  None  ____________________________________________   INITIAL IMPRESSION /  ASSESSMENT AND PLAN / ED COURSE  As part of my medical decision making, I reviewed the following data within the electronic MEDICAL RECORD NUMBER Nursing notes reviewed and incorporated, Labs reviewed, EKG interpreted NSR, Old chart reviewed, Radiograph reviewed and Notes from prior ED visits         Patient here with atypical chest pain.  Now asymptomatic.  He did use cocaine tonight and drink alcohol.  His first troponin is normal and his EKG is nonischemic.  Chest x-ray clear.  No sign of volume overload to suggest CHF.  Doubt dissection, PE especially given he is asymptomatic at this time.  Repeat troponin pending.  If this is unremarkable, anticipate discharge home with sober transport.  Will provide with outpatient resources.  Patient comfortable with this plan.  I reviewed all nursing notes and pertinent previous records as available.  I have reviewed and interpreted any EKGs, lab and urine results, imaging (as available).       ____________________________________________   FINAL CLINICAL IMPRESSION(S) / ED DIAGNOSES  Final diagnoses:  Atypical chest pain  Substance abuse [facility])     ED Discharge Orders    None      *Please note:  Douglas Mcdaniel was evaluated in Emergency Department on 04/04/2020 for the symptoms described in the history of present illness. He was evaluated in the context of the global COVID-19 pandemic, which necessitated consideration that the patient might be at risk for infection with the SARS-CoV-2 virus that causes COVID-19. Institutional protocols and algorithms that pertain to the evaluation of patients at risk for COVID-19 are in a state of rapid change based on information released by regulatory bodies including the CDC and federal and state organizations. These policies and algorithms were followed during the patient's care in the ED.  Some ED evaluations and interventions may be delayed as a result of limited staffing during and the pandemic.*   Note:   This document was prepared using Dragon voice recognition software and may include unintentional dictation errors.   [place], Layla Maw, DO 04/04/20 [phone]

## 2020-04-04 NOTE — Discharge Instructions (Addendum)
Your cardiac work-up today was unremarkable.  I recommend that you stop using cocaine as this can lead to chest pain, palpitations.  I also recommend that you cut down on your drinking.  If you need help with your alcohol and cocaine use, we have provided you with outpatient resources.  You may use over-the-counter simethicone as needed for gas.   Steps to find a Primary Care Provider (PCP):  Call [phone] or [phone] to access "[facility] Find a Doctor Service."  2.  You may also go on the [facility] website at [email or web address]

## 2020-09-03 ENCOUNTER — Encounter: Payer: Self-pay | Admitting: *Deleted

## 2020-09-03 ENCOUNTER — Other Ambulatory Visit: Payer: Self-pay

## 2020-09-03 ENCOUNTER — Emergency Department: Payer: BC Managed Care – PPO

## 2020-09-03 ENCOUNTER — Emergency Department
Admission: EM | Admit: 2020-09-03 | Discharge: 2020-09-03 | Disposition: A | Discharge status: Home / Self Care | Payer: BC Managed Care – PPO | Attending: Emergency Medicine | Admitting: Emergency Medicine

## 2020-09-03 DIAGNOSIS — J189 Pneumonia, unspecified organism: Secondary | ICD-10-CM | POA: Diagnosis not present

## 2020-09-03 DIAGNOSIS — Z5321 Procedure and treatment not carried out due to patient leaving prior to being seen by health care provider: Secondary | ICD-10-CM | POA: Diagnosis not present

## 2020-09-03 DIAGNOSIS — R519 Headache, unspecified: Secondary | ICD-10-CM | POA: Diagnosis present

## 2020-09-03 LAB — BASIC METABOLIC PANEL
Anion gap: 5 (ref 5–15)
BUN: 11 mg/dL (ref 6–20)
CO2: 26 mmol/L (ref 22–32)
Calcium: 8.2 mg/dL — ABNORMAL LOW (ref 8.9–10.3)
Chloride: 106 mmol/L (ref 98–111)
Creatinine, Ser: 1.04 mg/dL (ref 0.61–1.24)
GFR, Estimated: >60 mL/min (ref >60)
Glucose, Bld: 179 mg/dL — ABNORMAL HIGH (ref 70–99)
Potassium: 3.2 mmol/L — ABNORMAL LOW (ref 3.5–5.1)
Sodium: 137 mmol/L (ref 135–145)

## 2020-09-03 LAB — CBC
HCT: 48.4 % (ref 39.0–52.0)
Hemoglobin: 16.3 g/dL (ref 13.0–17.0)
MCH: 31 pg (ref 26.0–34.0)
MCHC: 33.7 g/dL (ref 30.0–36.0)
MCV: 92 fL (ref 80.0–100.0)
Platelets: 224 10*3/uL (ref 150–400)
RBC: 5.26 MIL/uL (ref 4.22–5.81)
RDW: 13.4 % (ref 11.5–15.5)
WBC: 6.3 10*3/uL (ref 4.0–10.5)
nRBC: 0 % (ref 0.0–0.2)

## 2020-09-03 LAB — TROPONIN I (HIGH SENSITIVITY): Troponin I (High Sensitivity): 3 ng/L (ref <18)

## 2020-09-03 NOTE — ED Triage Notes (Signed)
Pt says he feels like his chest is "swollen", his lungs "are filled with fluid", pain in the left shoulder and back area. Reports he had pneumonia about 3 weeks ago. Also has a headache and sleepy.  Took a laxative tonight.
